# Patient Record
Sex: Female | Born: 1935 | Race: White | Hispanic: No | Marital: Single | Smoking: Former smoker
Health system: Southern US, Community
[De-identification: ages and names within clinical notes are randomized; demographics above are authoritative.]

## PROBLEM LIST (undated history)

## (undated) DIAGNOSIS — F329 Major depressive disorder, single episode, unspecified: Secondary | ICD-10-CM

## (undated) DIAGNOSIS — K279 Peptic ulcer, site unspecified, unspecified as acute or chronic, without hemorrhage or perforation: Secondary | ICD-10-CM

## (undated) DIAGNOSIS — I639 Cerebral infarction, unspecified: Secondary | ICD-10-CM

## (undated) DIAGNOSIS — F419 Anxiety disorder, unspecified: Secondary | ICD-10-CM

## (undated) DIAGNOSIS — S32020A Wedge compression fracture of second lumbar vertebra, initial encounter for closed fracture: Secondary | ICD-10-CM

## (undated) DIAGNOSIS — F32A Depression, unspecified: Secondary | ICD-10-CM

## (undated) DIAGNOSIS — E785 Hyperlipidemia, unspecified: Secondary | ICD-10-CM

## (undated) DIAGNOSIS — F0390 Unspecified dementia without behavioral disturbance: Secondary | ICD-10-CM

## (undated) DIAGNOSIS — D472 Monoclonal gammopathy: Secondary | ICD-10-CM

## (undated) HISTORY — DX: Depression, unspecified: F32.A

## (undated) HISTORY — PX: TONSILLECTOMY: SUR1361

## (undated) HISTORY — DX: Hyperlipidemia, unspecified: E78.5

## (undated) HISTORY — DX: Anxiety disorder, unspecified: F41.9

## (undated) HISTORY — DX: Monoclonal gammopathy: D47.2

## (undated) HISTORY — DX: Major depressive disorder, single episode, unspecified: F32.9

## (undated) HISTORY — PX: OTHER SURGICAL HISTORY: SHX169

## (undated) HISTORY — DX: Peptic ulcer, site unspecified, unspecified as acute or chronic, without hemorrhage or perforation: K27.9

## (undated) HISTORY — PX: DILATION AND CURETTAGE OF UTERUS: SHX78

---

## 1997-11-05 ENCOUNTER — Encounter: Payer: Self-pay | Admitting: Orthopedic Surgery

## 1997-11-05 ENCOUNTER — Ambulatory Visit (HOSPITAL_COMMUNITY): Admission: RE | Admit: 1997-11-05 | Discharge: 1997-11-05 | Payer: Self-pay | Admitting: Orthopedic Surgery

## 1998-04-29 ENCOUNTER — Other Ambulatory Visit: Admission: RE | Admit: 1998-04-29 | Discharge: 1998-04-29 | Payer: Self-pay | Admitting: *Deleted

## 1999-08-09 ENCOUNTER — Other Ambulatory Visit: Admission: RE | Admit: 1999-08-09 | Discharge: 1999-08-09 | Payer: Self-pay | Admitting: *Deleted

## 2001-09-05 ENCOUNTER — Encounter: Payer: Self-pay | Admitting: Internal Medicine

## 2001-09-05 ENCOUNTER — Encounter: Admission: RE | Admit: 2001-09-05 | Discharge: 2001-09-05 | Payer: Self-pay | Admitting: Internal Medicine

## 2003-11-05 ENCOUNTER — Encounter: Admission: RE | Admit: 2003-11-05 | Discharge: 2003-11-05 | Payer: Self-pay | Admitting: Neurology

## 2004-04-20 ENCOUNTER — Other Ambulatory Visit: Admission: RE | Admit: 2004-04-20 | Discharge: 2004-04-20 | Payer: Self-pay | Admitting: Internal Medicine

## 2008-10-19 ENCOUNTER — Emergency Department (HOSPITAL_COMMUNITY): Admission: EM | Admit: 2008-10-19 | Discharge: 2008-10-19 | Payer: Self-pay | Admitting: Emergency Medicine

## 2009-04-10 ENCOUNTER — Ambulatory Visit (HOSPITAL_COMMUNITY): Admission: RE | Admit: 2009-04-10 | Discharge: 2009-04-10 | Payer: Self-pay | Admitting: Internal Medicine

## 2010-04-23 ENCOUNTER — Encounter: Payer: Self-pay | Admitting: Oncology

## 2010-04-30 LAB — URINALYSIS, ROUTINE W REFLEX MICROSCOPIC
Bilirubin Urine: NEGATIVE
Glucose, UA: NEGATIVE mg/dL
Ketones, ur: NEGATIVE mg/dL
Nitrite: NEGATIVE
pH: 7.5 (ref 5.0–8.0)

## 2010-04-30 LAB — URINE MICROSCOPIC-ADD ON

## 2010-05-12 ENCOUNTER — Other Ambulatory Visit: Payer: Self-pay | Admitting: Oncology

## 2010-05-12 ENCOUNTER — Encounter (HOSPITAL_BASED_OUTPATIENT_CLINIC_OR_DEPARTMENT_OTHER): Payer: Medicare Other | Admitting: Oncology

## 2010-05-12 ENCOUNTER — Ambulatory Visit (HOSPITAL_COMMUNITY)
Admission: RE | Admit: 2010-05-12 | Discharge: 2010-05-12 | Disposition: A | Discharge status: Home / Self Care | Payer: Medicare Other | Source: Ambulatory Visit | Attending: Oncology | Admitting: Oncology

## 2010-05-12 DIAGNOSIS — D472 Monoclonal gammopathy: Secondary | ICD-10-CM

## 2010-05-12 DIAGNOSIS — C9000 Multiple myeloma not having achieved remission: Secondary | ICD-10-CM

## 2010-05-12 DIAGNOSIS — M810 Age-related osteoporosis without current pathological fracture: Secondary | ICD-10-CM

## 2010-05-12 DIAGNOSIS — M47817 Spondylosis without myelopathy or radiculopathy, lumbosacral region: Secondary | ICD-10-CM | POA: Insufficient documentation

## 2010-05-12 DIAGNOSIS — M545 Low back pain, unspecified: Secondary | ICD-10-CM | POA: Insufficient documentation

## 2010-05-12 DIAGNOSIS — M4120 Other idiopathic scoliosis, site unspecified: Secondary | ICD-10-CM | POA: Insufficient documentation

## 2010-05-12 DIAGNOSIS — Z87898 Personal history of other specified conditions: Secondary | ICD-10-CM | POA: Insufficient documentation

## 2010-05-12 LAB — CBC WITH DIFFERENTIAL/PLATELET
Basophils Absolute: 0 10*3/uL (ref 0.0–0.1)
Eosinophils Absolute: 0 10*3/uL (ref 0.0–0.5)
HGB: 13.1 g/dL (ref 11.6–15.9)
NEUT#: 3.9 10*3/uL (ref 1.5–6.5)
RDW: 13.5 % (ref 11.2–14.5)
lymph#: 1.5 10*3/uL (ref 0.9–3.3)

## 2010-05-13 ENCOUNTER — Other Ambulatory Visit: Payer: Self-pay | Admitting: Oncology

## 2010-05-14 LAB — COMPREHENSIVE METABOLIC PANEL
AST: 19 U/L (ref 0–37)
Albumin: 4.3 g/dL (ref 3.5–5.2)
BUN: 7 mg/dL (ref 6–23)
Calcium: 9.4 mg/dL (ref 8.4–10.5)
Chloride: 98 mEq/L (ref 96–112)
Glucose, Bld: 104 mg/dL — ABNORMAL HIGH (ref 70–99)
Potassium: 3.9 mEq/L (ref 3.5–5.3)

## 2010-05-14 LAB — SPEP & IFE WITH QIG
Albumin ELP: 52.9 % — ABNORMAL LOW (ref 55.8–66.1)
Alpha-1-Globulin: 3.8 % (ref 2.9–4.9)
Beta 2: 2.9 % — ABNORMAL LOW (ref 3.2–6.5)
IgM, Serum: 64 mg/dL (ref 60–263)
Total Protein, Serum Electrophoresis: 7.8 g/dL (ref 6.0–8.3)

## 2010-05-14 LAB — KAPPA/LAMBDA LIGHT CHAINS
Kappa:Lambda Ratio: 0.5 (ref 0.26–1.65)
Lambda Free Lght Chn: 1.23 mg/dL (ref 0.57–2.63)

## 2010-05-17 LAB — UIFE/LIGHT CHAINS/TP QN, 24-HR UR
Alpha 2, Urine: DETECTED — AB
Time: 24 hours
Total Protein, Urine-Ur/day: 14 mg/d (ref 10–140)
Total Protein, Urine: 1.4 mg/dL

## 2010-05-26 ENCOUNTER — Encounter (HOSPITAL_BASED_OUTPATIENT_CLINIC_OR_DEPARTMENT_OTHER): Payer: Medicare Other | Admitting: Oncology

## 2010-05-26 DIAGNOSIS — D472 Monoclonal gammopathy: Secondary | ICD-10-CM

## 2010-10-08 ENCOUNTER — Emergency Department (HOSPITAL_COMMUNITY): Payer: Medicare Other

## 2010-10-08 ENCOUNTER — Emergency Department (HOSPITAL_COMMUNITY)
Admission: EM | Admit: 2010-10-08 | Discharge: 2010-10-08 | Disposition: A | Discharge status: Home / Self Care | Payer: Medicare Other | Attending: Emergency Medicine | Admitting: Emergency Medicine

## 2010-10-08 DIAGNOSIS — S32009A Unspecified fracture of unspecified lumbar vertebra, initial encounter for closed fracture: Secondary | ICD-10-CM | POA: Insufficient documentation

## 2010-10-08 DIAGNOSIS — M549 Dorsalgia, unspecified: Secondary | ICD-10-CM | POA: Insufficient documentation

## 2010-10-08 DIAGNOSIS — W010XXA Fall on same level from slipping, tripping and stumbling without subsequent striking against object, initial encounter: Secondary | ICD-10-CM | POA: Insufficient documentation

## 2010-10-08 DIAGNOSIS — Y92009 Unspecified place in unspecified non-institutional (private) residence as the place of occurrence of the external cause: Secondary | ICD-10-CM | POA: Insufficient documentation

## 2010-10-08 DIAGNOSIS — R51 Headache: Secondary | ICD-10-CM | POA: Insufficient documentation

## 2010-10-08 LAB — DIFFERENTIAL
Lymphs Abs: 2.3 10*3/uL (ref 0.7–4.0)
Monocytes Relative: 8 % (ref 3–12)
Neutro Abs: 5.1 10*3/uL (ref 1.7–7.7)
Neutrophils Relative %: 63 % (ref 43–77)

## 2010-10-08 LAB — BASIC METABOLIC PANEL
BUN: 9 mg/dL (ref 6–23)
CO2: 24 mEq/L (ref 19–32)
Calcium: 10.4 mg/dL (ref 8.4–10.5)
Creatinine, Ser: 0.51 mg/dL (ref 0.50–1.10)
Glucose, Bld: 110 mg/dL — ABNORMAL HIGH (ref 70–99)

## 2010-10-08 LAB — CBC
HCT: 37.8 % (ref 36.0–46.0)
Hemoglobin: 13.7 g/dL (ref 12.0–15.0)
MCH: 33.7 pg (ref 26.0–34.0)
MCV: 92.9 fL (ref 78.0–100.0)
RBC: 4.07 MIL/uL (ref 3.87–5.11)

## 2010-10-09 ENCOUNTER — Inpatient Hospital Stay (HOSPITAL_COMMUNITY)
Admission: EM | Admit: 2010-10-09 | Discharge: 2010-10-12 | DRG: 552 | Disposition: A | Discharge status: Home Health | Payer: Medicare Other | Attending: Internal Medicine | Admitting: Internal Medicine

## 2010-10-09 DIAGNOSIS — Y93H2 Activity, gardening and landscaping: Secondary | ICD-10-CM

## 2010-10-09 DIAGNOSIS — M545 Low back pain, unspecified: Secondary | ICD-10-CM | POA: Diagnosis present

## 2010-10-09 DIAGNOSIS — S32009A Unspecified fracture of unspecified lumbar vertebra, initial encounter for closed fracture: Principal | ICD-10-CM | POA: Diagnosis present

## 2010-10-09 DIAGNOSIS — F17200 Nicotine dependence, unspecified, uncomplicated: Secondary | ICD-10-CM | POA: Diagnosis present

## 2010-10-09 DIAGNOSIS — R296 Repeated falls: Secondary | ICD-10-CM | POA: Diagnosis present

## 2010-10-09 DIAGNOSIS — M810 Age-related osteoporosis without current pathological fracture: Secondary | ICD-10-CM | POA: Diagnosis present

## 2010-10-09 LAB — BASIC METABOLIC PANEL
BUN: 7 mg/dL (ref 6–23)
Calcium: 9.9 mg/dL (ref 8.4–10.5)
GFR calc non Af Amer: >60 mL/min (ref >60)
Glucose, Bld: 100 mg/dL — ABNORMAL HIGH (ref 70–99)
Sodium: 134 mEq/L — ABNORMAL LOW (ref 135–145)

## 2010-10-09 LAB — CBC
Hemoglobin: 12.3 g/dL (ref 12.0–15.0)
MCH: 33.5 pg (ref 26.0–34.0)
MCHC: 36.1 g/dL — ABNORMAL HIGH (ref 30.0–36.0)

## 2010-10-09 LAB — URINALYSIS, ROUTINE W REFLEX MICROSCOPIC
Bilirubin Urine: NEGATIVE
Protein, ur: NEGATIVE mg/dL
Urobilinogen, UA: 0.2 mg/dL (ref 0.0–1.0)

## 2010-10-09 LAB — URINE MICROSCOPIC-ADD ON

## 2010-10-09 LAB — DIFFERENTIAL
Basophils Relative: 0 % (ref 0–1)
Monocytes Absolute: 0.8 10*3/uL (ref 0.1–1.0)
Monocytes Relative: 9 % (ref 3–12)
Neutro Abs: 7.4 10*3/uL (ref 1.7–7.7)

## 2010-10-10 LAB — BASIC METABOLIC PANEL
CO2: 24 mEq/L (ref 19–32)
Calcium: 8.9 mg/dL (ref 8.4–10.5)
Chloride: 103 mEq/L (ref 96–112)
Creatinine, Ser: 0.52 mg/dL (ref 0.50–1.10)
GFR calc Af Amer: >60 mL/min (ref >60)
Sodium: 133 mEq/L — ABNORMAL LOW (ref 135–145)

## 2010-10-11 LAB — BASIC METABOLIC PANEL
Calcium: 9.2 mg/dL (ref 8.4–10.5)
GFR calc non Af Amer: >60 mL/min (ref >60)
Potassium: 3.2 mEq/L — ABNORMAL LOW (ref 3.5–5.1)
Sodium: 133 mEq/L — ABNORMAL LOW (ref 135–145)

## 2010-10-12 NOTE — Discharge Summary (Unsigned)
[name], Dana Bush NO.:  [id number]  MEDICAL RECORD NO.:  [id number]  LOCATION:  1602                         FACILITY:  [facility]  PHYSICIAN:  Zannie Cove, MD     DATE OF BIRTH:  11-12-1926  DATE OF ADMISSION:  10/09/2010 DATE OF DISCHARGE:                              DISCHARGE SUMMARY   PRIMARY CARE PHYSICIAN:  Soyla Murphy. Renne Crigler, MD  DISCHARGE DIAGNOSES: 1. Low back pain from L2 compression fracture. 2. Nausea and vomiting secondary to narcotics, resolved. 3. Tobacco use. 4. History of osteoporosis. 5. History of osteoarthritis.  DISCHARGE MEDICATIONS: 1. Tylenol 650 mg q.4 h. p.r.n. 2. Naproxen 500 mg p.o. b.i.d. for 5 days. 3. Cyclobenzaprine 5 mg p.o. t.i.d. probably for another 10 days or     so. 4. MiraLax 17 grams p.o. daily as needed for constipation. 5. Hydrocodone/APAP 5/325 one to two tablets q.6 h. p.r.n. for pain,     dispensed total of 20 pills. 6. Calcium carbonate with vitamin D 1 tablet p.o. daily.  DIAGNOSTICS/INVESTIGATIONS: 1. CT of the head, October 08, 2010 shows atrophy and small-vessel     ischemic changes, no acute intracranial abnormality. 2. X-ray of the lumbar spine shows L2 compression fracture.  HOSPITAL COURSE:  [name] is an 77 year old white female who had presented to the emergency room after a fall on her back where she was noted to have a L2 compression fracture and subsequently discharged home on pain medicines.  However, when she went back home, she was having severe low back pain which was impairing her mobility as well as having nausea and vomiting and hence brought back to the hospital by her son. 1. Nausea and vomiting felt to be related to narcotics, which she had     been recently prescribed.  She was managed symptomatically with     clear liquids in her diet and was subsequently advanced and her     nausea, vomiting at this time has completely resolved. 2. L2 compression fracture.  She is admitted  for pain control with     regards to this, initially started on Dilantin, subsequently     transitioned to naproxen and Flexeril and Vicodin added for better     pain control.  Today, she is ambulating with a walker now in the     room as well as in the hallway and is being discharged home with     Home Health Physical Therapy.  Kyphoplasty was offered to the     patient, however, she declined this and given that the long-term     data regarding kyphoplasty is questionable, it was not pursued     further.  DISCHARGE CONDITION:  Stable.  DISCHARGE VITAL SIGNS:  Temperature 98.1, pulse 53, blood pressure 138/58, respirations 16, and saturating 94% room air.  DISCHARGE FOLLOWUP:  With Dr. Renne Crigler in 1 week.     Zannie Cove, MD     PJ/MEDQ  D:  10/12/2010  T:  10/12/2010  Job:  [id number]  cc:   Soyla Murphy. Renne Crigler, M.D. Fax: [phone]

## 2010-10-14 NOTE — H&P (Signed)
Dana Bush, RUDDELL NO.:  [id number]  MEDICAL RECORD NO.:  [id number]  LOCATION:  1602                         FACILITY:  [facility]  PHYSICIAN:  Suella Grove, MD          DATE OF BIRTH:  [birth year]  DATE OF ADMISSION:  10/09/2010 DATE OF DISCHARGE:                             HISTORY & PHYSICAL   PCP:  Dr. Norma Fredrickson.  CHIEF COMPLAINT:  Uncontrolled pain from L2 fracture.  HISTORY OF PRESENT ILLNESS:  The patient is an 77 year old woman who has a history of chronic back pain and osteoporosis, who initially had a traumatic event yesterday to an L2 compression fracture.  The patient reports that she was in her usual state of health yesterday when she fell backwards after trying to pull up a weed from her garden.  Fall was a witnessed fall, seen by her husband.  The patient reports having fallen on her back and hitting her head.  No loss of consciousness. This patient was brought to the emergency room at [facility] yesterday by EMS, at which point labs and imaging were performed.  Head CT was negative for any acute process.  Lumbar spine radiographs demonstrated an L2 compression deformity, new since the previous study.  It was thought that this new L2 deformity was secondary to her recent fall. The patient was discharged to home with pain medications given her desire to return home.  However, the patient reports that she was not able to do well at home, with p.o. medications not helping her pain.  Her son who was with the patient at time of interview reports that the patient had not taken any p.o. intake since leaving the Emergency Department.  The patient reports continued persistent back pain with no radiating pain down the legs.  No paresthesias.  No bowel or bladder incontinence. Able to walk, although limited due to pain.  Has been having some issues with nausea and emesis with the pain medication.  The patient thought that she needed further help with pain control  at which point her son brought her back to the ED for further evaluation.  PAST MEDICAL HISTORY:  Arthritis, back pain, osteoporosis.  PAST SURGICAL HISTORY:  None.  ALLERGIES:  No known drug allergies.  MEDICATIONS:  None.  SOCIAL HISTORY:  Married, does not work.  Reports a 23-pack year history of smoking.  No EtOH or illicits.  FAMILY HISTORY:  No history of cardiovascular disease or malignancy.  REVIEW OF SYSTEMS:  GENERAL:  No fever, chills, sweats, night sweats, or weight loss.  HEENT:  No problems with vision, no dysphasia or odynophagia. PULMONARY:  No cough or history of obstructive lung disease. CARDIOVASCULAR:  No problems with chest pain, palpitations, dyspnea on exertion, or paroxysmal nocturnal dyspnea.  GI:  No abdominal pain, melena, hematochezia, or diarrhea. GU:  No dysuria, increased urinary urgency or frequency. MUSCULOSKELETAL:  As per HPI.  SKIN:  No rash. NEURO:  As per HPI.  PSYCHIATRIC:  No suicidal or homicidal ideation.  PHYSICAL EXAMINATION:  VITALS:  Temperature 98.6, blood pressure 132/50,heart rate 73, respirations 16, O2 sat 94% on room air. GENERAL:  Alert and oriented x3, in no  apparent distress. HEENT:  Head atraumatic.  Pupils are equal and reactive to light. Extraocular motions are intact.  Sclerae are anicteric.  Oropharynx is unremarkable. NECK:  Soft, no thyromegaly or lymphadenopathy identified. LUNGS:  Clear to auscultation bilaterally.  No wheezes, rales, or rhonchi. HEART:  Regular rate and rhythm, normal S1 and S2, no rubs, gallops, or murmurs. ABDOMEN:  Soft, nontender, nondistended, positive bowel sounds, no masses. BACK:  Midline tenderness to palpation in the region of the L2 area.  No radiating pain. NEURO:  Grossly intact with 5/5 strength in the bilateral lower extremities, limited mildly on the left secondary to pain.  2+ patellar and ankle jerk reflexes are noted.  LABORATORY DATA:  CBC:  White blood count 9.4,  hemoglobin 12.3, platelet count 204.  Chemistry:  Sodium 134, potassium 3.6, chloride 100, bicarb 26, BUN 7, creatinine 0.55, glucose 100.  Urinalysis, grossly negative.  IMAGING:  (From yesterday):  Lumbar spine radiographs positive for L2 compression deformity.  IMPRESSION/PLAN: 1. L2 compression fracture:  Admit the patient for pain control.  We     will discontinue p.o. pain control regimen and start with low dose     Dilaudid regimen to calculate the patient's 24-hour needs prior to     adjustment.  We will add Colace and Zofran p.r.n. for associated     symptoms.  We will consider an Interventional Radiology and/or     Neurosurgical consult for further surgical treatment if the patient     desires.  The patient will consider an bisphosphonate therapy,     which she has discussed with her PCP.  Prior to admission, the     patient reports that she had decided not to take bisphosphonate.     However, given the evidence for secondary fracture prevention, the     patient has started to reconsider the option of Zometa.  To be     discussed with her son and her physician in near future. 2. Tobacco abuse:  We will start a nicotine patch given the patient's     longstanding dependence on tobacco.  Smoking cessation consult will     also be consulted. 3. Fluids/electrolytes/nutrition:  We will hep-lock the IV.     Electrolytes will be monitored.  The patient will be restarted on a     regular diet as tolerated.  DISPOSITION:  Physical therapy consult.  The patient reports that she would like to be full code with the caveat that no "heroic measures are performed."  The patient's power of attorney and son is Dana Bush, and his home phone number is [phone], cell phone number [phone].          ______________________________ Suella Grove, MD     AC/MEDQ  D:  10/09/2010  T:  10/09/2010  Job:  [id number]  Electronically Signed by Suella Grove MD on 10/14/2010 12:08:50 PM

## 2010-10-18 ENCOUNTER — Emergency Department (HOSPITAL_COMMUNITY)
Admission: EM | Admit: 2010-10-18 | Discharge: 2010-10-19 | Disposition: A | Discharge status: Home / Self Care | Payer: Medicare Other | Attending: Emergency Medicine | Admitting: Emergency Medicine

## 2010-10-18 ENCOUNTER — Emergency Department (HOSPITAL_COMMUNITY): Payer: Medicare Other

## 2010-10-18 DIAGNOSIS — M439 Deforming dorsopathy, unspecified: Secondary | ICD-10-CM | POA: Insufficient documentation

## 2010-10-18 DIAGNOSIS — R109 Unspecified abdominal pain: Secondary | ICD-10-CM | POA: Insufficient documentation

## 2010-10-18 DIAGNOSIS — Z79899 Other long term (current) drug therapy: Secondary | ICD-10-CM | POA: Insufficient documentation

## 2010-10-18 DIAGNOSIS — K5900 Constipation, unspecified: Secondary | ICD-10-CM | POA: Insufficient documentation

## 2010-11-02 ENCOUNTER — Encounter: Payer: Self-pay | Admitting: *Deleted

## 2010-11-17 NOTE — Discharge Summary (Addendum)
  NAMEJANNESSA, OGDEN NO.:  [id number]  MEDICAL RECORD NO.:  [id number]  LOCATION:  WLED                         FACILITY:  [facility]  PHYSICIAN:  Zannie Cove, MD     DATE OF BIRTH:  11-Sep-1926  DATE OF ADMISSION:  10/18/2010 DATE OF DISCHARGE:  10/19/2010                              DISCHARGE SUMMARY   PRIMARY CARE PHYSICIAN:  Soyla Murphy. Pharr, MD  DISCHARGE DIAGNOSES: 1. L2 compression fracture status post fall. 2. Tobacco use. 3. Constipation secondary to narcotics, improved. 4. Osteoporosis. 5. History of osteoarthritis. 6. History of low back pain.  DISCHARGE MEDICATIONS: 1. Tylenol 650 mg q.4 h. p.r.n. 2. Flexeril 5 mg p.o. t.i.d. for 10 days. 3. Vicodin 5/325 one to two tabs q.6 h. p.r.n. for pain. 4. Naproxen 500 mg p.o. b.i.d. for 5 days. 5. MiraLAX 17 g daily p.r.n. 6. Calcium carbonate/vitamin D 1 tab daily. 7. Multivitamin 1 tab daily.  HOSPITAL COURSE:  Ms. Husak is an 77 year old female with prior history of osteoporosis and chronic back pain, had a fall 2 days prior to admission.  Subsequently came to [facility] ER, had a head CT performed which was unremarkable and lumbar x-rays which showed an L2 compression deformity, which was felt to be new.  She was discharged home with pain medications given her desire to return home.  However, after the patient went back home, she was not able to do well in terms of pain control, as well as ambulation, and hence came back to the ER. 1. L2 compression fracture.  She was treated with anti-inflammatories,     i.e. naproxen as well as Flexeril for spasms and Vicodin p.r.n. for     pain, as well as followed by Physical Therapy through her hospital     course who started ambulating her with a walker. 2. Constipation secondary to narcotics, resolved. Condition at time of discharge is stable.  She is discharged home on NSAIDs for 5 days, Flexeril for about 10 days, and Vicodin p.r.n. for 1- 2 weeks'  time as well, as well as home health physical therapy.     Zannie Cove, MD     PJ/MEDQ  D:  11/11/2010  T:  11/11/2010  Job:  [id number]  cc:   Soyla Murphy. Renne Crigler, M.D.  Electronically Signed by Zannie Cove  on 11/17/2010 03:02:01 PM

## 2010-12-11 ENCOUNTER — Telehealth: Payer: Self-pay | Admitting: Oncology

## 2010-12-11 NOTE — Telephone Encounter (Signed)
S/w pt, gave new appt 03/08/11 @ 10.30am. Date per pt req. Pt says she fell and broke her bac and needs time to recover. Advised pt I will also mail her an appt calendar.

## 2011-01-05 ENCOUNTER — Emergency Department (HOSPITAL_COMMUNITY): Payer: Medicare Other

## 2011-01-05 ENCOUNTER — Inpatient Hospital Stay (HOSPITAL_COMMUNITY)
Admission: EM | Admit: 2011-01-05 | Discharge: 2011-01-11 | DRG: 552 | Disposition: A | Discharge status: Skilled Nursing Facility | Payer: Medicare Other | Attending: Internal Medicine | Admitting: Internal Medicine

## 2011-01-05 ENCOUNTER — Encounter (HOSPITAL_COMMUNITY): Payer: Self-pay | Admitting: Emergency Medicine

## 2011-01-05 DIAGNOSIS — S32010A Wedge compression fracture of first lumbar vertebra, initial encounter for closed fracture: Secondary | ICD-10-CM | POA: Diagnosis present

## 2011-01-05 DIAGNOSIS — M810 Age-related osteoporosis without current pathological fracture: Secondary | ICD-10-CM | POA: Diagnosis present

## 2011-01-05 DIAGNOSIS — R42 Dizziness and giddiness: Secondary | ICD-10-CM | POA: Diagnosis not present

## 2011-01-05 DIAGNOSIS — Y92009 Unspecified place in unspecified non-institutional (private) residence as the place of occurrence of the external cause: Secondary | ICD-10-CM

## 2011-01-05 DIAGNOSIS — E441 Mild protein-calorie malnutrition: Secondary | ICD-10-CM

## 2011-01-05 DIAGNOSIS — N390 Urinary tract infection, site not specified: Secondary | ICD-10-CM

## 2011-01-05 DIAGNOSIS — S32009A Unspecified fracture of unspecified lumbar vertebra, initial encounter for closed fracture: Principal | ICD-10-CM | POA: Diagnosis present

## 2011-01-05 DIAGNOSIS — Z8711 Personal history of peptic ulcer disease: Secondary | ICD-10-CM

## 2011-01-05 DIAGNOSIS — F17200 Nicotine dependence, unspecified, uncomplicated: Secondary | ICD-10-CM | POA: Diagnosis present

## 2011-01-05 DIAGNOSIS — W010XXA Fall on same level from slipping, tripping and stumbling without subsequent striking against object, initial encounter: Secondary | ICD-10-CM | POA: Diagnosis present

## 2011-01-05 DIAGNOSIS — W19XXXA Unspecified fall, initial encounter: Secondary | ICD-10-CM | POA: Diagnosis present

## 2011-01-05 DIAGNOSIS — E871 Hypo-osmolality and hyponatremia: Secondary | ICD-10-CM | POA: Diagnosis present

## 2011-01-05 HISTORY — DX: Wedge compression fracture of second lumbar vertebra, initial encounter for closed fracture: S32.020A

## 2011-01-05 LAB — BASIC METABOLIC PANEL
BUN: 9 mg/dL (ref 6–23)
CO2: 23 mEq/L (ref 19–32)
Chloride: 92 mEq/L — ABNORMAL LOW (ref 96–112)
GFR calc non Af Amer: 87 mL/min — ABNORMAL LOW (ref >90)
Glucose, Bld: 110 mg/dL — ABNORMAL HIGH (ref 70–99)
Potassium: 4 mEq/L (ref 3.5–5.1)
Sodium: 123 mEq/L — ABNORMAL LOW (ref 135–145)

## 2011-01-05 LAB — CBC
Hemoglobin: 12.1 g/dL (ref 12.0–15.0)
Platelets: 210 10*3/uL (ref 150–400)
RBC: 3.72 MIL/uL — ABNORMAL LOW (ref 3.87–5.11)
WBC: 12.9 10*3/uL — ABNORMAL HIGH (ref 4.0–10.5)

## 2011-01-05 LAB — DIFFERENTIAL
Eosinophils Absolute: 0 10*3/uL (ref 0.0–0.7)
Lymphocytes Relative: 10 % — ABNORMAL LOW (ref 12–46)
Lymphs Abs: 1.3 10*3/uL (ref 0.7–4.0)
Monocytes Relative: 6 % (ref 3–12)
Neutro Abs: 10.8 10*3/uL — ABNORMAL HIGH (ref 1.7–7.7)
Neutrophils Relative %: 84 % — ABNORMAL HIGH (ref 43–77)

## 2011-01-05 LAB — URINALYSIS, ROUTINE W REFLEX MICROSCOPIC
Specific Gravity, Urine: 1.012 (ref 1.005–1.030)
Urobilinogen, UA: 0.2 mg/dL (ref 0.0–1.0)
pH: 7 (ref 5.0–8.0)

## 2011-01-05 LAB — URINE MICROSCOPIC-ADD ON

## 2011-01-05 MED ORDER — ONDANSETRON HCL 4 MG/2ML IJ SOLN
INTRAMUSCULAR | Status: AC
  Administered 2011-01-05: 17:00:00 via INTRAVENOUS
  Filled 2011-01-05: qty 2

## 2011-01-05 MED ORDER — POTASSIUM CHLORIDE IN NACL 20-0.9 MEQ/L-% IV SOLN
INTRAVENOUS | Status: DC
  Administered 2011-01-06 – 2011-01-07 (×3): via INTRAVENOUS
  Filled 2011-01-05 (×12): qty 1000

## 2011-01-05 MED ORDER — OXYCODONE HCL 5 MG PO TABS
5.0000 mg | ORAL_TABLET | Freq: Three times a day (TID) | ORAL | Status: DC
  Administered 2011-01-06 – 2011-01-11 (×15): 5 mg via ORAL
  Filled 2011-01-05 (×15): qty 1

## 2011-01-05 MED ORDER — HYDROCODONE-ACETAMINOPHEN 5-325 MG PO TABS
1.0000 | ORAL_TABLET | Freq: Once | ORAL | Status: AC
  Administered 2011-01-05: 1 via ORAL
  Filled 2011-01-05: qty 1

## 2011-01-05 MED ORDER — DEXTROSE 5 % IV SOLN
1.0000 g | Freq: Once | INTRAVENOUS | Status: AC
  Administered 2011-01-05: 1 g via INTRAVENOUS
  Filled 2011-01-05: qty 10

## 2011-01-05 MED ORDER — SODIUM CHLORIDE 0.9 % IV SOLN
INTRAVENOUS | Status: DC
  Administered 2011-01-05: 1000 mL via INTRAVENOUS

## 2011-01-05 MED ORDER — ONDANSETRON HCL 4 MG/2ML IJ SOLN
INTRAMUSCULAR | Status: AC
  Filled 2011-01-05: qty 2

## 2011-01-05 MED ORDER — ONDANSETRON HCL 4 MG/2ML IJ SOLN
INTRAMUSCULAR | Status: AC
  Administered 2011-01-05: 4 mg via INTRAVENOUS
  Filled 2011-01-05: qty 2

## 2011-01-05 NOTE — ED Notes (Signed)
Pt. Dana Bush stumbled and fell at home.  No LOC c/o lower back pain.

## 2011-01-05 NOTE — ED Notes (Signed)
Pt. With prior fall 3 months earlier in which she sustained a "shattered spine" according to her spouse, fell again today.  Pt."s spouse reports that she did get dizzy prior to fall.  No LOC. Pt. Vomited in route to hospital and was given 4mg . Of Zofran IV.  She arrived alert, and oriented c/o lower back pain.  No prior medical history except for previous fall.

## 2011-01-05 NOTE — ED Provider Notes (Signed)
History     CSN: [id number] Arrival date & time: 01/05/2011  4:55 PM   First MD Initiated Contact with Patient 01/05/11 1708      Chief Complaint  Patient presents with  . Fall    (Consider location/radiation/quality/duration/timing/severity/associated sxs/prior treatment) Patient is a 77 y.o. female presenting with fall.  Fall The accident occurred 1 to 2 hours ago. The fall occurred while walking. She landed on carpet. Point of impact: Lower back. The pain is present in the head (Neck and low back). The pain is moderate. She was not ambulatory at the scene. There was no entrapment after the fall. There was no drug use involved in the accident. There was no alcohol use involved in the accident. Pertinent negatives include no visual change, no abdominal pain, no bowel incontinence, no vomiting, no hearing loss and no loss of consciousness. The symptoms are aggravated by activity, standing and ambulation. Treatment on scene includes a c-collar and a backboard. She has tried nothing for the symptoms.   patient tripped while walking without her ambulation device. She had been attempting to put up some decorations. She did not ambulate afterwards because of severe low back pain. She denies bowel or urinary incontinence and has no weakness or numbness of the lower legs.  Past Medical History  Diagnosis Date  . Peptic ulcer disease   . Osteoporosis   . Depression   . Anxiety   . Hyperlipemia   . IgG monoclonal gammopathy     Past Surgical History  Procedure Date  . Cervical polyp removal   . Dilation and curettage of uterus   . Tonsillectomy     No family history on file.  History  Substance Use Topics  . Smoking status: Current Everyday Smoker -- 1.0 packs/day  . Smokeless tobacco: Not on file  . Alcohol Use: No    OB History    Grav Para Term Preterm Abortions TAB SAB Ect Mult Living                  Review of Systems  Gastrointestinal: Negative for vomiting,  abdominal pain and bowel incontinence.  Neurological: Negative for loss of consciousness.  All other systems reviewed and are negative.    Allergies  Review of patient's allergies indicates no known allergies.  Home Medications   Current Outpatient Rx  Name Route Sig Dispense Refill  . ASPIRIN EC 81 MG PO TBEC Oral Take 81 mg by mouth daily.      Marland Kitchen VITAMIN D 1000 UNITS PO TABS Oral Take 1,000 Units by mouth daily.      Marland Kitchen HYDROCODONE-ACETAMINOPHEN 5-325 MG PO TABS Oral Take 1-2 tablets by mouth every 6 (six) hours as needed. For pain.     Marland Kitchen MIRTAZAPINE 15 MG PO TABS Oral Take 15 mg by mouth at bedtime.      Carma Leaven M PLUS PO TABS Oral Take 1 tablet by mouth daily.      Marland Kitchen NAPROXEN 500 MG PO TABS Oral Take 500 mg by mouth 2 (two) times daily as needed. For pain.     Marland Kitchen TRAMADOL HCL 50 MG PO TABS Oral Take 50 mg by mouth every 8 (eight) hours as needed. Maximum dose= 8 tablets per day; for pain.       BP 145/69  Pulse 76  Temp(Src) 97.7 F (36.5 C) (Oral)  Resp 18  SpO2 94%  Physical Exam  Constitutional: She is oriented to person, place, and time. She appears well-developed and  well-nourished.  HENT:  Head: Normocephalic and atraumatic.  Eyes: Conjunctivae and EOM are normal. Pupils are equal, round, and reactive to light.  Neck: Normal range of motion. Neck supple.  Cardiovascular: Normal rate and regular rhythm.   Pulmonary/Chest: Effort normal and breath sounds normal.  Abdominal: Soft.  Musculoskeletal: She exhibits no edema.       Tender lumbar and cervical spines. No deformity, redness or swelling of the neck or back. The cervical collar was replaced after the exam.  Neurological: She is alert and oriented to person, place, and time. No cranial nerve deficit. She exhibits normal muscle tone. Coordination normal.  Skin: Skin is warm and dry.  Psychiatric: She has a normal mood and affect. Her behavior is normal.    ED Course  Procedures (including critical care  time) She was removed from the backboard , afterwards, she had some nausea and vomiting. She was treated with Zofran. Labs Reviewed  CBC - Abnormal; Notable for the following:    WBC 12.9 (*)    RBC 3.72 (*)    HCT 34.1 (*)    All other components within normal limits  DIFFERENTIAL - Abnormal; Notable for the following:    Neutrophils Relative 84 (*)    Neutro Abs 10.8 (*)    Lymphocytes Relative 10 (*)    All other components within normal limits  BASIC METABOLIC PANEL - Abnormal; Notable for the following:    Sodium 123 (*)    Chloride 92 (*)    Glucose, Bld 110 (*)    GFR calc non Af Amer 87 (*)    All other components within normal limits  URINALYSIS, ROUTINE W REFLEX MICROSCOPIC - Abnormal; Notable for the following:    Hgb urine dipstick SMALL (*)    Ketones, ur 15 (*)    Leukocytes, UA TRACE (*)    All other components within normal limits  URINE MICROSCOPIC-ADD ON - Abnormal; Notable for the following:    Bacteria, UA FEW (*)    Casts HYALINE CASTS (*)    All other components within normal limits  URINE CULTURE   Dg Chest 2 View  01/05/2011  *RADIOLOGY REPORT*  Clinical Data: Fall  CHEST - 2 VIEW  Comparison: None.  Findings: Prominent lung markings suggestive of chronic lung disease.  Negative for infiltrate or effusion.  Negative for heart failure.  Heart size is mildly enlarged.  Compression fracture of L1.  See lumbar spine report.  IMPRESSION: No active cardiopulmonary disease.  Original Report Authenticated By: Camelia Phenes, M.D.   Dg Ribs Unilateral Right  01/05/2011  *RADIOLOGY REPORT*  Clinical Data: Fall.  Pain  RIGHT RIBS - 2 VIEW  Comparison: None.  Findings: Negative for rib fracture on the right.  No focal bony lesion.  IMPRESSION: Negative  Original Report Authenticated By: Camelia Phenes, M.D.   Dg Lumbar Spine Complete  01/05/2011  *RADIOLOGY REPORT*  Clinical Data: Fall.  Back pain  LUMBAR SPINE - COMPLETE 4+ VIEW  Comparison: 10/08/2010  Findings:  Moderate to severe fracture of L1 has progressed in severity since the prior study.  This may be related to the recent fall.  No other fractures.  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  Grade 1 slip L4 on L5 with mild disc degeneration at L3-4 and L4-5. Atherosclerotic disease in the aorta.  IMPRESSION: Moderate to severe compression fracture of L1, with progression since 10/08/2010.  Per CMS PQRS  reporting requirements (PQRS Measure 24): Given the patient's age of greater than 50 and the fracture site (hip, distal radius, or spine), the patient should be tested for osteoporosis using DXA, and the appropriate treatment considered based on the DXA results.  Original Report Authenticated By: Camelia Phenes, M.D.   Dg Pelvis 1-2 Views  01/05/2011  *RADIOLOGY REPORT*  Clinical Data: Fall.  Pain  PELVIS - 1-2 VIEW  Comparison: 10/18/2010  Findings: Both hips are in normal alignment.  No significant hip degeneration.  Negative for pelvic or hip fracture. Atherosclerotic disease in the iliac arteries.  IMPRESSION: Negative for fracture.  Original Report Authenticated By: Camelia Phenes, M.D.   Ct Head Wo Contrast  01/05/2011  *RADIOLOGY REPORT*  Clinical Data:  Fall.  Head and neck pain.  CT HEAD WITHOUT CONTRAST CT CERVICAL SPINE WITHOUT CONTRAST  Technique:  Multidetector CT imaging of the head and cervical spine was performed following the standard protocol without intravenous contrast.  Multiplanar CT image reconstructions of the cervical spine were also generated.  Comparison:  10/08/2010 and previous  CT HEAD  Findings: The brain shows generalized atrophy with extensive chronic small vessel disease throughout the white matter.  No sign of acute infarction, mass lesion, hemorrhage, hydrocephalus or extra-axial collection.  No skull fracture.  Sinuses, middle ears and mastoids are clear.  IMPRESSION: Atrophy and chronic small vessel disease.  No acute or  traumatic finding.  No change since previous study.  CT CERVICAL SPINE  Findings: No evidence of fracture or traumatic malalignment.  No significant degenerative change either of the discs or facets.  No focal lesion.  IMPRESSION: Remarkably normal scan for person of this age.  No acute or traumatic finding.  Only minimal degenerative changes.  Original Report Authenticated By: Thomasenia Sales, M.D.   Ct Cervical Spine Wo Contrast  01/05/2011  *RADIOLOGY REPORT*  Clinical Data:  Fall.  Head and neck pain.  CT HEAD WITHOUT CONTRAST CT CERVICAL SPINE WITHOUT CONTRAST  Technique:  Multidetector CT imaging of the head and cervical spine was performed following the standard protocol without intravenous contrast.  Multiplanar CT image reconstructions of the cervical spine were also generated.  Comparison:  10/08/2010 and previous  CT HEAD  Findings: The brain shows generalized atrophy with extensive chronic small vessel disease throughout the white matter.  No sign of acute infarction, mass lesion, hemorrhage, hydrocephalus or extra-axial collection.  No skull fracture.  Sinuses, middle ears and mastoids are clear.  IMPRESSION: Atrophy and chronic small vessel disease.  No acute or traumatic finding.  No change since previous study.  CT CERVICAL SPINE  Findings: No evidence of fracture or traumatic malalignment.  No significant degenerative change either of the discs or facets.  No focal lesion.  IMPRESSION: Remarkably normal scan for person of this age.  No acute or traumatic finding.  Only minimal degenerative changes.  Original Report Authenticated By: Thomasenia Sales, M.D.   ED course: IV fluids, and Rocephin ordered for UTI. On attempt at ambulation. She was unable to stand or walk due to the low back pain.  1. UTI (lower urinary tract infection)   2. Compression fracture of L1 lumbar vertebra       MDM  Fall, cause unclear, possibly related to UTI. She sustained a worsening compression fracture of the  lumbar spine therefore, needs to be admitted for further management with possible consideration for kyphoplasty        Flint Melter, MD 01/05/11 2335

## 2011-01-05 NOTE — H&P (Signed)
Hospital Admission Note Date: 01/05/2011  Patient name: Dana Bush Medical record number: [id number] Date of birth: [birth year] Age: 77 y.o. Gender: female PCP: Londell Moh, MD, MD  Chief Complaint: fall   History of Present Illness: Patient was putting up Christmas decorations, turned and fell.  She denies dizziness or chest pain.  She was feeling fine at the time.  Meds: Medications Prior to Admission  Medication Dose Route Frequency Provider Last Rate Last Dose  . 0.9 %  sodium chloride infusion   Intravenous Continuous Flint Melter, MD 125 mL/hr at 01/05/11 2232 1,000 mL at 01/05/11 2232  . cefTRIAXone (ROCEPHIN) 1 g in dextrose 5 % 50 mL IVPB  1 g Intravenous Once Flint Melter, MD   1 g at 01/05/11 2229  . HYDROcodone-acetaminophen (NORCO) 5-325 MG per tablet 1 tablet  1 tablet Oral Once Flint Melter, MD   1 tablet at 01/05/11 2204  . ondansetron (ZOFRAN) 4 MG/2ML injection           . ondansetron (ZOFRAN) 4 MG/2ML injection        4 mg at 01/05/11 2233  . ondansetron (ZOFRAN) 4 MG/2ML injection            No current outpatient prescriptions on file as of 01/05/2011.     Allergies: Review of patient's allergies indicates no known allergies. Past Medical History  Diagnosis Date  . Peptic ulcer disease   . Osteoporosis   . Depression   . Anxiety   . Hyperlipemia   . IgG monoclonal gammopathy    Past Surgical History  Procedure Date  . Cervical polyp removal   . Dilation and curettage of uterus   . Tonsillectomy    No family history on file. History   Social History  . Marital Status: Single    Spouse Name: N/A    Number of Children: N/A  . Years of Education: N/A   Occupational History  . Not on file.   Social History Main Topics  . Smoking status: Current Everyday Smoker -- 1.0 packs/day  . Smokeless tobacco: Not on file  . Alcohol Use: No  . Drug Use:   . Sexually Active:    Other Topics Concern  . Not on file   Social  History Narrative  . No narrative on file    Review of Systems: Review of Systems  Constitutional: Negative.   HENT: Negative.   Eyes: Negative.   Respiratory: Negative.   Cardiovascular: Negative.   Gastrointestinal: Positive for constipation. Negative for heartburn, nausea, vomiting, abdominal pain, diarrhea and melena.  Genitourinary: Negative.   Musculoskeletal: Positive for back pain.       Back pain from previous compression fracture was improving and patient was moving around easily.  Skin: Negative.   Neurological: Negative.   Psychiatric/Behavioral: Negative.      Filed Vitals:   01/05/11 1702 01/05/11 2251  BP: 145/69   Pulse: 76   Temp: 97.7 F (36.5 C)   TempSrc: Oral   Resp: 18   SpO2: 97% 94%    Physical Exam:Physical Exam  Vitals reviewed. Constitutional: She is oriented to person, place, and time. She appears well-developed and well-nourished. No distress.  HENT:  Head: Normocephalic and atraumatic.  Mouth/Throat: Oropharyngeal exudate present.  Eyes: Conjunctivae and EOM are normal. Pupils are equal, round, and reactive to light. Right eye exhibits no discharge. Left eye exhibits discharge. No scleral icterus.  Neck: Normal range of motion. Neck supple. No JVD present.  No tracheal deviation present. No thyromegaly present.  Cardiovascular: Normal rate, regular rhythm and normal heart sounds.   Pulmonary/Chest: No stridor. No respiratory distress. She has no wheezes. She has no rales. She exhibits no tenderness.  Abdominal: Soft. Bowel sounds are normal. She exhibits no distension and no mass. There is no tenderness. There is no rebound and no guarding.  Musculoskeletal: She exhibits no edema and no tenderness.  Lymphadenopathy:    She has no cervical adenopathy.  Neurological: She is alert and oriented to person, place, and time. No cranial nerve deficit.  Skin: Skin is warm. No rash noted. She is diaphoretic. No erythema. No pallor.  Psychiatric: She  has a normal mood and affect. Her behavior is normal. Judgment and thought content normal.     Lab results: Results for orders placed during the hospital encounter of 01/05/11  CBC      Component Value Range   WBC 12.9 (*) 4.0 - 10.5 (K/uL)   RBC 3.72 (*) 3.87 - 5.11 (MIL/uL)   Hemoglobin 12.1  12.0 - 15.0 (g/dL)   HCT 40.9 (*) 81.1 - 46.0 (%)   MCV 91.7  78.0 - 100.0 (fL)   MCH 32.5  26.0 - 34.0 (pg)   MCHC 35.5  30.0 - 36.0 (g/dL)   RDW 91.4  78.2 - 95.6 (%)   Platelets 210  150 - 400 (K/uL)  DIFFERENTIAL      Component Value Range   Neutrophils Relative 84 (*) 43 - 77 (%)   Neutro Abs 10.8 (*) 1.7 - 7.7 (K/uL)   Lymphocytes Relative 10 (*) 12 - 46 (%)   Lymphs Abs 1.3  0.7 - 4.0 (K/uL)   Monocytes Relative 6  3 - 12 (%)   Monocytes Absolute 0.8  0.1 - 1.0 (K/uL)   Eosinophils Relative 0  0 - 5 (%)   Eosinophils Absolute 0.0  0.0 - 0.7 (K/uL)   Basophils Relative 0  0 - 1 (%)   Basophils Absolute 0.0  0.0 - 0.1 (K/uL)  BASIC METABOLIC PANEL      Component Value Range   Sodium 123 (*) 135 - 145 (mEq/L)   Potassium 4.0  3.5 - 5.1 (mEq/L)   Chloride 92 (*) 96 - 112 (mEq/L)   CO2 23  19 - 32 (mEq/L)   Glucose, Bld 110 (*) 70 - 99 (mg/dL)   BUN 9  6 - 23 (mg/dL)   Creatinine, Ser 2.13  0.50 - 1.10 (mg/dL)   Calcium 9.2  8.4 - 08.6 (mg/dL)   GFR calc non Af Amer 87 (*) >90 (mL/min)   GFR calc Af Amer >90  >90 (mL/min)  URINALYSIS, ROUTINE W REFLEX MICROSCOPIC      Component Value Range   Color, Urine YELLOW  YELLOW    APPearance CLEAR  CLEAR    Specific Gravity, Urine 1.012  1.005 - 1.030    pH 7.0  5.0 - 8.0    Glucose, UA NEGATIVE  NEGATIVE (mg/dL)   Hgb urine dipstick SMALL (*) NEGATIVE    Bilirubin Urine NEGATIVE  NEGATIVE    Ketones, ur 15 (*) NEGATIVE (mg/dL)   Protein, ur NEGATIVE  NEGATIVE (mg/dL)   Urobilinogen, UA 0.2  0.0 - 1.0 (mg/dL)   Nitrite NEGATIVE  NEGATIVE    Leukocytes, UA TRACE (*) NEGATIVE   URINE MICROSCOPIC-ADD ON      Component Value Range     WBC, UA 0-2  <3 (WBC/hpf)   RBC / HPF 0-2  <3 (  RBC/hpf)   Bacteria, UA FEW (*) RARE    Casts HYALINE CASTS (*) NEGATIVE    Imaging results:  Dg Chest 2 View  01/05/2011  *RADIOLOGY REPORT*  Clinical Data: Fall  CHEST - 2 VIEW  Comparison: None.  Findings: Prominent lung markings suggestive of chronic lung disease.  Negative for infiltrate or effusion.  Negative for heart failure.  Heart size is mildly enlarged.  Compression fracture of L1.  See lumbar spine report.  IMPRESSION: No active cardiopulmonary disease.  Original Report Authenticated By: Camelia Phenes, M.D.   Dg Ribs Unilateral Right  01/05/2011  *RADIOLOGY REPORT*  Clinical Data: Fall.  Pain  RIGHT RIBS - 2 VIEW  Comparison: None.  Findings: Negative for rib fracture on the right.  No focal bony lesion.  IMPRESSION: Negative  Original Report Authenticated By: Camelia Phenes, M.D.   Dg Lumbar Spine Complete  01/05/2011  *RADIOLOGY REPORT*  Clinical Data: Fall.  Back pain  LUMBAR SPINE - COMPLETE 4+ VIEW  Comparison: 10/08/2010  Findings: Moderate to severe fracture of L1 has progressed in severity since the prior study.  This may be related to the recent fall.  No other fractures.  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  Grade 1 slip L4 on L5 with mild disc degeneration at L3-4 and L4-5. Atherosclerotic disease in the aorta.  IMPRESSION: Moderate to severe compression fracture of L1, with progression since 10/08/2010.  Per CMS PQRS reporting requirements (PQRS Measure 24): Given the patient's age of greater than 50 and the fracture site (hip, distal radius, or spine), the patient should be tested for osteoporosis using DXA, and the appropriate treatment considered based on the DXA results.  Original Report Authenticated By: Camelia Phenes, M.D.   Dg Pelvis 1-2 Views  01/05/2011  *RADIOLOGY REPORT*  Clinical Data: Fall.  Pain  PELVIS - 1-2 VIEW  Comparison: 10/18/2010  Findings:  Both hips are in normal alignment.  No significant hip degeneration.  Negative for pelvic or hip fracture. Atherosclerotic disease in the iliac arteries.  IMPRESSION: Negative for fracture.  Original Report Authenticated By: Camelia Phenes, M.D.   Ct Head Wo Contrast  01/05/2011  *RADIOLOGY REPORT*  Clinical Data:  Fall.  Head and neck pain.  CT HEAD WITHOUT CONTRAST CT CERVICAL SPINE WITHOUT CONTRAST  Technique:  Multidetector CT imaging of the head and cervical spine was performed following the standard protocol without intravenous contrast.  Multiplanar CT image reconstructions of the cervical spine were also generated.  Comparison:  10/08/2010 and previous  CT HEAD  Findings: The brain shows generalized atrophy with extensive chronic small vessel disease throughout the white matter.  No sign of acute infarction, mass lesion, hemorrhage, hydrocephalus or extra-axial collection.  No skull fracture.  Sinuses, middle ears and mastoids are clear.  IMPRESSION: Atrophy and chronic small vessel disease.  No acute or traumatic finding.  No change since previous study.  CT CERVICAL SPINE  Findings: No evidence of fracture or traumatic malalignment.  No significant degenerative change either of the discs or facets.  No focal lesion.  IMPRESSION: Remarkably normal scan for person of this age.  No acute or traumatic finding.  Only minimal degenerative changes.  Original Report Authenticated By: Thomasenia Sales, M.D.   Ct Cervical Spine Wo Contrast  01/05/2011  *RADIOLOGY REPORT*  Clinical Data:  Fall.  Head and neck pain.  CT HEAD WITHOUT CONTRAST CT CERVICAL SPINE WITHOUT CONTRAST  Technique:  Multidetector CT imaging of the head and cervical spine was performed following the standard protocol without intravenous contrast.  Multiplanar CT image reconstructions of the cervical spine were also generated.  Comparison:  10/08/2010 and previous  CT HEAD  Findings: The brain shows generalized atrophy with extensive chronic  small vessel disease throughout the white matter.  No sign of acute infarction, mass lesion, hemorrhage, hydrocephalus or extra-axial collection.  No skull fracture.  Sinuses, middle ears and mastoids are clear.  IMPRESSION: Atrophy and chronic small vessel disease.  No acute or traumatic finding.  No change since previous study.  CT CERVICAL SPINE  Findings: No evidence of fracture or traumatic malalignment.  No significant degenerative change either of the discs or facets.  No focal lesion.  IMPRESSION: Remarkably normal scan for person of this age.  No acute or traumatic finding.  Only minimal degenerative changes.  Original Report Authenticated By: Thomasenia Sales, M.D.     Assessment & Plan by Problem:  #1. Worsening of previous compression fracture- She may be a good candidate for ballon Kyphoplasty for better pain control.  Consult appropriate doctor in am.  Start scheduled stool softener because of constipation on narcotics in past.  Consider scheduled pain meds. #2.  Osteoporosis-  Patient is on Vitamin D (?calcium too), but a bisphosphonate needs to be added.  Consider weekly dosing.  Also patient often misses her vitamins.  Patient needs education about the importance of calcium and vitamin D.   #3. H/o peptic ulcer disease.  Stop Nsaids since bisphosphonate will be started.  #4. Hyponatremia- likely secondary to mild dehydration.  ivf  Patient Active Problem List  Diagnoses  . Compression fracture of L1 lumbar vertebra  . Fall  . Hyponatremia  . Osteoporosis       Greater than 70 minutes was spent on direct patient care and coordination of care.  Doria Fern 01/05/2011, 11:22 PM

## 2011-01-05 NOTE — ED Notes (Signed)
[id number]<AV> Expected date:01/05/11<BR> Expected time: 5:00 PM<BR> Means of arrival:Ambulance<BR> Comments:<BR> EMS 120 GC - fall/back pain

## 2011-01-05 NOTE — ED Notes (Signed)
Pt. Started vomiting while MD in room.  4mg . Of Zofran ordered IV.

## 2011-01-05 NOTE — ED Notes (Signed)
Patient had significant trouble coming to a sitting position. Attempted to assist patient to stand. Patient unable to stand. Patient states "she wants to be admitted"

## 2011-01-05 NOTE — ED Notes (Signed)
Pt. Taken off backboard and examined by Dr. Effie Shy.

## 2011-01-06 ENCOUNTER — Emergency Department (HOSPITAL_COMMUNITY): Payer: Medicare Other

## 2011-01-06 ENCOUNTER — Encounter (HOSPITAL_COMMUNITY): Payer: Self-pay

## 2011-01-06 DIAGNOSIS — S32009A Unspecified fracture of unspecified lumbar vertebra, initial encounter for closed fracture: Secondary | ICD-10-CM

## 2011-01-06 DIAGNOSIS — G8911 Acute pain due to trauma: Secondary | ICD-10-CM

## 2011-01-06 LAB — CBC
HCT: 32 % — ABNORMAL LOW (ref 36.0–46.0)
Hemoglobin: 11.2 g/dL — ABNORMAL LOW (ref 12.0–15.0)
MCH: 32.2 pg (ref 26.0–34.0)
MCHC: 35 g/dL (ref 30.0–36.0)
MCV: 92 fL (ref 78.0–100.0)
Platelets: 207 10*3/uL (ref 150–400)
RBC: 3.48 MIL/uL — ABNORMAL LOW (ref 3.87–5.11)
RDW: 13.5 % (ref 11.5–15.5)
WBC: 6.3 10*3/uL (ref 4.0–10.5)

## 2011-01-06 LAB — BASIC METABOLIC PANEL
BUN: 6 mg/dL (ref 6–23)
CO2: 23 mEq/L (ref 19–32)
Calcium: 9 mg/dL (ref 8.4–10.5)
Chloride: 101 mEq/L (ref 96–112)
Creatinine, Ser: 0.47 mg/dL — ABNORMAL LOW (ref 0.50–1.10)
GFR calc Af Amer: >90 mL/min (ref >90)
GFR calc non Af Amer: 90 mL/min — ABNORMAL LOW (ref >90)
Glucose, Bld: 82 mg/dL (ref 70–99)
Potassium: 3.4 mEq/L — ABNORMAL LOW (ref 3.5–5.1)
Sodium: 131 mEq/L — ABNORMAL LOW (ref 135–145)

## 2011-01-06 MED ORDER — VITAMIN D3 25 MCG (1000 UNIT) PO TABS
1000.0000 [IU] | ORAL_TABLET | Freq: Every day | ORAL | Status: DC
  Administered 2011-01-06 – 2011-01-11 (×6): 1000 [IU] via ORAL
  Filled 2011-01-06 (×7): qty 1

## 2011-01-06 MED ORDER — SENNA 8.6 MG PO TABS
1.0000 | ORAL_TABLET | Freq: Two times a day (BID) | ORAL | Status: DC
  Administered 2011-01-06 – 2011-01-11 (×11): 8.6 mg via ORAL
  Filled 2011-01-06 (×12): qty 1

## 2011-01-06 MED ORDER — BISACODYL 5 MG PO TBEC
5.0000 mg | DELAYED_RELEASE_TABLET | Freq: Every day | ORAL | Status: DC | PRN
  Administered 2011-01-09: 5 mg via ORAL
  Filled 2011-01-06 (×2): qty 1

## 2011-01-06 MED ORDER — ALUM & MAG HYDROXIDE-SIMETH 200-200-20 MG/5ML PO SUSP: 30.0000 mL | Freq: Four times a day (QID) | ORAL | Status: DC | PRN

## 2011-01-06 MED ORDER — FAMOTIDINE 20 MG PO TABS
20.0000 mg | ORAL_TABLET | Freq: Two times a day (BID) | ORAL | Status: DC
  Administered 2011-01-06 – 2011-01-11 (×11): 20 mg via ORAL
  Filled 2011-01-06 (×12): qty 1

## 2011-01-06 MED ORDER — GADOBENATE DIMEGLUMINE 529 MG/ML IV SOLN
10.0000 mL | Freq: Once | INTRAVENOUS | Status: AC | PRN
  Administered 2011-01-06: 10 mL via INTRAVENOUS

## 2011-01-06 MED ORDER — ONDANSETRON HCL 4 MG PO TABS: 4.0000 mg | ORAL_TABLET | Freq: Four times a day (QID) | ORAL | Status: DC | PRN

## 2011-01-06 MED ORDER — ASPIRIN EC 81 MG PO TBEC
81.0000 mg | DELAYED_RELEASE_TABLET | Freq: Every day | ORAL | Status: DC
  Administered 2011-01-06 – 2011-01-11 (×6): 81 mg via ORAL
  Filled 2011-01-06 (×7): qty 1

## 2011-01-06 MED ORDER — HYDROCODONE-ACETAMINOPHEN 5-325 MG PO TABS
1.0000 | ORAL_TABLET | Freq: Four times a day (QID) | ORAL | Status: DC | PRN
  Administered 2011-01-07 – 2011-01-09 (×5): 1 via ORAL
  Administered 2011-01-10: 2 via ORAL
  Filled 2011-01-06 (×2): qty 1
  Filled 2011-01-06: qty 2
  Filled 2011-01-06 (×3): qty 1

## 2011-01-06 MED ORDER — MIRTAZAPINE 15 MG PO TABS
15.0000 mg | ORAL_TABLET | Freq: Every day | ORAL | Status: DC
  Administered 2011-01-06 – 2011-01-10 (×5): 15 mg via ORAL
  Filled 2011-01-06 (×6): qty 1

## 2011-01-06 MED ORDER — THERA M PLUS PO TABS
1.0000 | ORAL_TABLET | Freq: Every day | ORAL | Status: DC
  Administered 2011-01-06 – 2011-01-11 (×6): 1 via ORAL
  Filled 2011-01-06 (×6): qty 1

## 2011-01-06 NOTE — ED Notes (Signed)
Pt. Bed was wet, was cleaned and changed.

## 2011-01-06 NOTE — Consult Note (Signed)
Reason for Consult:Compression fracture L1 vertebrae Referring Physician: Dr. Gearldine Shown is an 77 y.o. female.  HPI: Pt fell decorating her house for Christmas yesterday 01/05/11.   Past Medical History  Diagnosis Date  . Peptic ulcer disease   . Osteoporosis   . Depression   . Anxiety   . Hyperlipemia   . IgG monoclonal gammopathy   . Compression fracture of L2 lumbar vertebra     Past Surgical History  Procedure Date  . Cervical polyp removal   . Dilation and curettage of uterus   . Tonsillectomy     History reviewed. No pertinent family history.  Social History:  reports that she has been smoking Cigarettes.  She has been smoking about 1 pack per day. She has never used smokeless tobacco. She reports that she does not drink alcohol or use illicit drugs.  Allergies: No Known Allergies  Medications: I have reviewed the patient's current medications.  Results for orders placed during the hospital encounter of 01/05/11 (from the past 48 hour(s))  CBC     Status: Abnormal   Collection Time   01/05/11  5:27 PM      Component Value Range Comment   WBC 12.9 (*) 4.0 - 10.5 (K/uL)    RBC 3.72 (*) 3.87 - 5.11 (MIL/uL)    Hemoglobin 12.1  12.0 - 15.0 (g/dL)    HCT 16.1 (*) 09.6 - 46.0 (%)    MCV 91.7  78.0 - 100.0 (fL)    MCH 32.5  26.0 - 34.0 (pg)    MCHC 35.5  30.0 - 36.0 (g/dL)    RDW 04.5  40.9 - 81.1 (%)    Platelets 210  150 - 400 (K/uL)   DIFFERENTIAL     Status: Abnormal   Collection Time   01/05/11  5:27 PM      Component Value Range Comment   Neutrophils Relative 84 (*) 43 - 77 (%)    Neutro Abs 10.8 (*) 1.7 - 7.7 (K/uL)    Lymphocytes Relative 10 (*) 12 - 46 (%)    Lymphs Abs 1.3  0.7 - 4.0 (K/uL)    Monocytes Relative 6  3 - 12 (%)    Monocytes Absolute 0.8  0.1 - 1.0 (K/uL)    Eosinophils Relative 0  0 - 5 (%)    Eosinophils Absolute 0.0  0.0 - 0.7 (K/uL)    Basophils Relative 0  0 - 1 (%)    Basophils Absolute 0.0  0.0 - 0.1 (K/uL)     BASIC METABOLIC PANEL     Status: Abnormal   Collection Time   01/05/11  5:27 PM      Component Value Range Comment   Sodium 123 (*) 135 - 145 (mEq/L)    Potassium 4.0  3.5 - 5.1 (mEq/L)    Chloride 92 (*) 96 - 112 (mEq/L)    CO2 23  19 - 32 (mEq/L)    Glucose, Bld 110 (*) 70 - 99 (mg/dL)    BUN 9  6 - 23 (mg/dL)    Creatinine, Ser 9.14  0.50 - 1.10 (mg/dL)    Calcium 9.2  8.4 - 10.5 (mg/dL)    GFR calc non Af Amer 87 (*) >90 (mL/min)    GFR calc Af Amer >90  >90 (mL/min)   URINALYSIS, ROUTINE W REFLEX MICROSCOPIC     Status: Abnormal   Collection Time   01/05/11  7:40 PM      Component Value Range  Comment   Color, Urine YELLOW  YELLOW     APPearance CLEAR  CLEAR     Specific Gravity, Urine 1.012  1.005 - 1.030     pH 7.0  5.0 - 8.0     Glucose, UA NEGATIVE  NEGATIVE (mg/dL)    Hgb urine dipstick SMALL (*) NEGATIVE     Bilirubin Urine NEGATIVE  NEGATIVE     Ketones, ur 15 (*) NEGATIVE (mg/dL)    Protein, ur NEGATIVE  NEGATIVE (mg/dL)    Urobilinogen, UA 0.2  0.0 - 1.0 (mg/dL)    Nitrite NEGATIVE  NEGATIVE     Leukocytes, UA TRACE (*) NEGATIVE    URINE MICROSCOPIC-ADD ON     Status: Abnormal   Collection Time   01/05/11  7:40 PM      Component Value Range Comment   WBC, UA 0-2  <3 (WBC/hpf)    RBC / HPF 0-2  <3 (RBC/hpf)    Bacteria, UA FEW (*) RARE     Casts HYALINE CASTS (*) NEGATIVE    BASIC METABOLIC PANEL     Status: Abnormal   Collection Time   01/06/11  5:00 AM      Component Value Range Comment   Sodium 131 (*) 135 - 145 (mEq/L) DELTA CHECK NOTED   Potassium 3.4 (*) 3.5 - 5.1 (mEq/L)    Chloride 101  96 - 112 (mEq/L) DELTA CHECK NOTED   CO2 23  19 - 32 (mEq/L)    Glucose, Bld 82  70 - 99 (mg/dL)    BUN 6  6 - 23 (mg/dL)    Creatinine, Ser 1.19 (*) 0.50 - 1.10 (mg/dL)    Calcium 9.0  8.4 - 10.5 (mg/dL)    GFR calc non Af Amer 90 (*) >90 (mL/min)    GFR calc Af Amer >90  >90 (mL/min)   CBC     Status: Abnormal   Collection Time   01/06/11  5:00 AM       Component Value Range Comment   WBC 6.3  4.0 - 10.5 (K/uL)    RBC 3.48 (*) 3.87 - 5.11 (MIL/uL)    Hemoglobin 11.2 (*) 12.0 - 15.0 (g/dL)    HCT 14.7 (*) 82.9 - 46.0 (%)    MCV 92.0  78.0 - 100.0 (fL)    MCH 32.2  26.0 - 34.0 (pg)    MCHC 35.0  30.0 - 36.0 (g/dL)    RDW 56.2  13.0 - 86.5 (%)    Platelets 207  150 - 400 (K/uL)     Dg Chest 2 View  01/05/2011  *RADIOLOGY REPORT*  Clinical Data: Fall  CHEST - 2 VIEW  Comparison: None.  Findings: Prominent lung markings suggestive of chronic lung disease.  Negative for infiltrate or effusion.  Negative for heart failure.  Heart size is mildly enlarged.  Compression fracture of L1.  See lumbar spine report.  IMPRESSION: No active cardiopulmonary disease.  Original Report Authenticated By: Camelia Phenes, M.D.   Dg Ribs Unilateral Right  01/05/2011  *RADIOLOGY REPORT*  Clinical Data: Fall.  Pain  RIGHT RIBS - 2 VIEW  Comparison: None.  Findings: Negative for rib fracture on the right.  No focal bony lesion.  IMPRESSION: Negative  Original Report Authenticated By: Camelia Phenes, M.D.   Dg Lumbar Spine Complete  01/05/2011  *RADIOLOGY REPORT*  Clinical Data: Fall.  Back pain  LUMBAR SPINE - COMPLETE 4+ VIEW  Comparison: 10/08/2010  Findings: Moderate to severe fracture of L1  has progressed in severity since the prior study.  This may be related to the recent fall.  No other fractures.  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  Grade 1 slip L4 on L5 with mild disc degeneration at L3-4 and L4-5. Atherosclerotic disease in the aorta.  IMPRESSION: Moderate to severe compression fracture of L1, with progression since 10/08/2010.  Per CMS PQRS reporting requirements (PQRS Measure 24): Given the patient's age of greater than 50 and the fracture site (hip, distal radius, or spine), the patient should be tested for osteoporosis using DXA, and the appropriate treatment considered based on the DXA results.   Original Report Authenticated By: Camelia Phenes, M.D.   Dg Pelvis 1-2 Views  01/05/2011  *RADIOLOGY REPORT*  Clinical Data: Fall.  Pain  PELVIS - 1-2 VIEW  Comparison: 10/18/2010  Findings: Both hips are in normal alignment.  No significant hip degeneration.  Negative for pelvic or hip fracture. Atherosclerotic disease in the iliac arteries.  IMPRESSION: Negative for fracture.  Original Report Authenticated By: Camelia Phenes, M.D.   Ct Head Wo Contrast  01/05/2011  *RADIOLOGY REPORT*  Clinical Data:  Fall.  Head and neck pain.  CT HEAD WITHOUT CONTRAST CT CERVICAL SPINE WITHOUT CONTRAST  Technique:  Multidetector CT imaging of the head and cervical spine was performed following the standard protocol without intravenous contrast.  Multiplanar CT image reconstructions of the cervical spine were also generated.  Comparison:  10/08/2010 and previous  CT HEAD  Findings: The brain shows generalized atrophy with extensive chronic small vessel disease throughout the white matter.  No sign of acute infarction, mass lesion, hemorrhage, hydrocephalus or extra-axial collection.  No skull fracture.  Sinuses, middle ears and mastoids are clear.  IMPRESSION: Atrophy and chronic small vessel disease.  No acute or traumatic finding.  No change since previous study.  CT CERVICAL SPINE  Findings: No evidence of fracture or traumatic malalignment.  No significant degenerative change either of the discs or facets.  No focal lesion.  IMPRESSION: Remarkably normal scan for person of this age.  No acute or traumatic finding.  Only minimal degenerative changes.  Original Report Authenticated By: Thomasenia Sales, M.D.   Ct Cervical Spine Wo Contrast  01/05/2011  *RADIOLOGY REPORT*  Clinical Data:  Fall.  Head and neck pain.  CT HEAD WITHOUT CONTRAST CT CERVICAL SPINE WITHOUT CONTRAST  Technique:  Multidetector CT imaging of the head and cervical spine was performed following the standard protocol without intravenous contrast.   Multiplanar CT image reconstructions of the cervical spine were also generated.  Comparison:  10/08/2010 and previous  CT HEAD  Findings: The brain shows generalized atrophy with extensive chronic small vessel disease throughout the white matter.  No sign of acute infarction, mass lesion, hemorrhage, hydrocephalus or extra-axial collection.  No skull fracture.  Sinuses, middle ears and mastoids are clear.  IMPRESSION: Atrophy and chronic small vessel disease.  No acute or traumatic finding.  No change since previous study.  CT CERVICAL SPINE  Findings: No evidence of fracture or traumatic malalignment.  No significant degenerative change either of the discs or facets.  No focal lesion.  IMPRESSION: Remarkably normal scan for person of this age.  No acute or traumatic finding.  Only minimal degenerative changes.  Original Report Authenticated By: Thomasenia Sales, M.D.   Mr Lumbar Spine W Wo Contrast  01/06/2011  *RADIOLOGY REPORT*  Clinical Data: Evaluate compression fractures.  MRI  LUMBAR SPINE WITHOUT AND WITH CONTRAST  Technique:  Multiplanar and multiecho pulse sequences of the lumbar spine were obtained without and with intravenous contrast.  Contrast: 10mL MULTIHANCE GADOBENATE DIMEGLUMINE 529 MG/ML IV SOLN  Comparison: 01/05/2011 plain film exam. 10/19/2008 CT.  No comparison MR.  Findings: Last fully open disc space is labeled L5-S1.  Present examination incorporates from upper T11 through the lower sacral region.  Conus L1-2 level.  Visualized paravertebral structures demonstrate adrenal hyperplasia otherwise unremarkable.  Heterogeneous bone marrow probably related to osteoporosis. Abnormal signal throughout the L3 vertebra greater to left midline. Appearance is most suggestive of hemangioma (when combined with CT appearance).  Nonspecific rounded 8 mm lesion within the left aspect of the L2 vertebral body does not demonstrate characteristics of a hemangioma. Malignancy not excluded.  Stability of this  can be confirmed on follow-up  Anterior wedge compression fracture of the L1 vertebral body with 90% loss of height centrally, 80% loss of height anteriorly and with retropulsion of the posterior-superior aspect of the compressed vertebra contributing to mild spinal stenosis greater on the left.  This approaches but does not compress the conus.  Mild paravertebral soft tissue prominence may represent hemorrhage.  No epidural extension.  T12 superior endplate compression fracture with 15 - 20% loss of height.  This extends into the right pedicle.  Minimal retropulsion of the right posterior lateral aspect of the compressed superior endplate without significant spinal stenosis.  Tiny amount of epidural blood extends from the right T12 pedicle level superiorly to the T11-T12 disc space level.  Subtle L4 superior endplate compression fracture greater to the right of midline.  Per CMS PQRI reporting requirements (PQRI Measure 24): Given the patient's age of greater than 50 and the fracture site (hip, distal radius, or spine), the patient should be tested for osteoporosis using DXA, and the appropriate treatment considered based on the DXA results.  T11-12:  Mild facet joint degenerative changes.  T12-L1:  Mild spinal stenosis greater on the left as discussed above.  L1-2:  Minimal facet joint degenerative changes.  Minimal bulge.  L2-3:  Mild facet joint degenerative changes.  Mild bulge.  Mild ligamentum flavum hypertrophy.  Very mild spinal stenosis with a trefoil appearance.  L3-4:  Moderate bilateral facet joint degenerative changes.  3.8 mm anterior slip of L3.  Bulge/broad-based protrusion slightly greater right lateral position with mild encroachment upon the exiting right L3 nerve root which does not appear significantly compressed. Ligamentum flavum hypertrophy.  Multifactorial mild to slightly moderate spinal stenosis and lateral recess stenosis.  L4-5:  Moderate facet joint degenerative changes.  Ligamentum  flavum hypertrophy greater on the left with slight impression upon the left lateral aspect of the thecal sac.  2.4 mm anterior slip of L4.  Bulge.  Mild to slightly moderate superior lateral recess stenosis.  Mild spinal stenosis.  L5-S1:  Facet joint degenerative changes.  Mild bulge.  No significant spinal stenosis or foraminal narrowing.  IMPRESSION: Compression fractures of T12, L1 and L4 as detailed above.  There is a small amount of epidural blood on the right extending from the T11-12 disc space to the right T12 pedicle level without significant associated mass effect.  Probable hemangioma L3 vertebral body.  Nonspecific 8 mm lesion within the left aspect of the L2 vertebral body.  Stability can be confirmed on follow-up.  Scattered degenerative changes as detailed above with multifactorial spinal stenosis and lateral recess stenosis most prominent at the L3-4 level.  Original Report Authenticated By:  Fuller Canada, M.D.    Review of Systems  Constitutional: Negative.   HENT: Negative.   Eyes: Negative.   Respiratory: Negative for cough, hemoptysis, sputum production, shortness of breath and wheezing.   Cardiovascular: Negative for chest pain, claudication and leg swelling.  Gastrointestinal: Negative.   Genitourinary: Negative.   Musculoskeletal: Positive for back pain and falls.  Skin: Negative.   Neurological: Negative.   Endo/Heme/Allergies: Negative.   Psychiatric/Behavioral: Negative.    Blood pressure 120/70, pulse 78, temperature 98 F (36.7 C), temperature source Oral, resp. rate 20, height 5\' 1"  (1.549 m), weight 49.896 kg (110 lb), SpO2 97.00%. Physical Exam  Constitutional: She appears well-developed.  HENT:  Mouth/Throat: Oropharynx is clear and moist.  Eyes: Pupils are equal, round, and reactive to light.  Neck: Neck supple.  Cardiovascular: Normal heart sounds and intact distal pulses.   No murmur heard. Respiratory: Breath sounds normal. No respiratory distress.  She has no wheezes. She has no rales. She exhibits no tenderness.  GI: Soft. She exhibits no distension and no mass. There is no tenderness. There is no rebound and no guarding.  Musculoskeletal: She exhibits tenderness.  Neurological: She is alert. She has normal strength. No sensory deficit.       No major neurological deficit in lowers.    Assessment/Plan: Ambulate with therapy in lumbosacral corset.   Rhen Kawecki A 01/06/2011, 5:39 PM

## 2011-01-06 NOTE — ED Notes (Signed)
Patient transported to MRI, agrees to test

## 2011-01-06 NOTE — ED Notes (Signed)
Patient off the floor in MRI

## 2011-01-06 NOTE — Consult Note (Signed)
Reason for Consult:Fall, L1 fx Referring Physician: Ashley Royalty   HPI: Dana Bush is an 77 y.o. female who was previously admitted in September for back fx, noted to be L2 at that time. She refused Kyphoplasty at that time and has been on po rx for osteoporosis prevention. However, yesterday she reports putting up some christmas decorations and lost her balance and "fell" backwards from standing.  She denies hitting her head. Denies LOC before or after the fall. She came to the ED for c/o back pain. Medical team is admitting and has ordered MRI. Request trauma eval as she fell. She denies any other injuries or c/o.  Past Medical History:  Past Medical History  Diagnosis Date  . Peptic ulcer disease   . Osteoporosis   . Depression   . Anxiety   . Hyperlipemia   . IgG monoclonal gammopathy     Surgical History:  Past Surgical History  Procedure Date  . Cervical polyp removal   . Dilation and curettage of uterus   . Tonsillectomy     Family History: No family history on file.  Social History:  reports that she has been smoking.  She does not have any smokeless tobacco history on file. She reports that she does not drink alcohol. Her drug history not on file.  Allergies: No Known Allergies  Medications: I have reviewed the patient's current medications.  ROS: See HPI for pertinent findings, otherwise complete 10 system review negative.  Physical Exam: Blood pressure 113/58, pulse 65, temperature 99 F (37.2 C), temperature source Oral, resp. rate 18, SpO2 96.00%.  General Appearance:  Alert, cooperative, no distress, appears stated age  Head:  Normocephalic, without obvious abnormality, atraumatic  ENT: Unremarkable  Neck: Supple, symmetrical, trachea midline, no adenopathy, thyroid: not enlarged, symmetric, no tenderness/mass/nodules  Lungs:   Clear to auscultation bilaterally, no w/r/r, respirations unlabored without use of accessory muscles.  Chest Wall:  No tenderness  or deformity  Heart:  Regular rate and rhythm, S1, S2 normal, no murmur, rub or gallop. Carotids 2+ without bruit.  Abdomen:   Soft, non-tender, non distended. Bowel sounds active all four quadrants,  no masses, no organomegaly.  Genitalia:  Normal. No hernias  Rectal:  Deferred.  Extremities: Extremities normal, atraumatic, no cyanosis or edema  Pulses: 2+ and symmetric  Skin: Skin color, texture, turgor normal, no rashes or lesions  Neurologic: Normal affect, no gross deficits.     Labs: CBC  Basename 01/06/11 0500 01/05/11 1727  WBC 6.3 12.9*  HGB 11.2* 12.1  HCT 32.0* 34.1*  PLT 207 210   MET  Basename 01/06/11 0500 01/05/11 1727  NA 131* 123*  K 3.4* 4.0  CL 101 92*  CO2 23 23  GLUCOSE 82 110*  BUN 6 9  CREATININE 0.47* 0.51  CALCIUM 9.0 9.2   No results found for this basename: PROT,ALBUMIN,AST,ALT,ALKPHOS,BILITOT,BILIDIR,IBILI,LIPASE in the last 72 hours PT/INR No results found for this basename: LABPROT:2,INR:2 in the last 72 hours ABG No results found for this basename: PHART:2,PCO2:2,PO2:2,HCO3:2 in the last 72 hours    Dg Chest 2 View  01/05/2011  *RADIOLOGY REPORT*  Clinical Data: Fall  CHEST - 2 VIEW  Comparison: None.  Findings: Prominent lung markings suggestive of chronic lung disease.  Negative for infiltrate or effusion.  Negative for heart failure.  Heart size is mildly enlarged.  Compression fracture of L1.  See lumbar spine report.  IMPRESSION: No active cardiopulmonary disease.  Original Report Authenticated By: Camelia Phenes, M.D.  Dg Ribs Unilateral Right  01/05/2011  *RADIOLOGY REPORT*  Clinical Data: Fall.  Pain  RIGHT RIBS - 2 VIEW  Comparison: None.  Findings: Negative for rib fracture on the right.  No focal bony lesion.  IMPRESSION: Negative  Original Report Authenticated By: Camelia Phenes, M.D.   Dg Lumbar Spine Complete  01/05/2011  *RADIOLOGY REPORT*  Clinical Data: Fall.  Back pain  LUMBAR SPINE - COMPLETE 4+ VIEW  Comparison:  10/08/2010  Findings: Moderate to severe fracture of L1 has progressed in severity since the prior study.  This may be related to the recent fall.  No other fractures.  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  Grade 1 slip L4 on L5 with mild disc degeneration at L3-4 and L4-5. Atherosclerotic disease in the aorta.  IMPRESSION: Moderate to severe compression fracture of L1, with progression since 10/08/2010.  Per CMS PQRS reporting requirements (PQRS Measure 24): Given the patient's age of greater than 50 and the fracture site (hip, distal radius, or spine), the patient should be tested for osteoporosis using DXA, and the appropriate treatment considered based on the DXA results.  Original Report Authenticated By: Camelia Phenes, M.D.   Dg Pelvis 1-2 Views  01/05/2011  *RADIOLOGY REPORT*  Clinical Data: Fall.  Pain  PELVIS - 1-2 VIEW  Comparison: 10/18/2010  Findings: Both hips are in normal alignment.  No significant hip degeneration.  Negative for pelvic or hip fracture. Atherosclerotic disease in the iliac arteries.  IMPRESSION: Negative for fracture.  Original Report Authenticated By: Camelia Phenes, M.D.   Ct Head Wo Contrast  01/05/2011  *RADIOLOGY REPORT*  Clinical Data:  Fall.  Head and neck pain.  CT HEAD WITHOUT CONTRAST CT CERVICAL SPINE WITHOUT CONTRAST  Technique:  Multidetector CT imaging of the head and cervical spine was performed following the standard protocol without intravenous contrast.  Multiplanar CT image reconstructions of the cervical spine were also generated.  Comparison:  10/08/2010 and previous  CT HEAD  Findings: The brain shows generalized atrophy with extensive chronic small vessel disease throughout the white matter.  No sign of acute infarction, mass lesion, hemorrhage, hydrocephalus or extra-axial collection.  No skull fracture.  Sinuses, middle ears and mastoids are clear.  IMPRESSION: Atrophy and chronic small vessel  disease.  No acute or traumatic finding.  No change since previous study.  CT CERVICAL SPINE  Findings: No evidence of fracture or traumatic malalignment.  No significant degenerative change either of the discs or facets.  No focal lesion.  IMPRESSION: Remarkably normal scan for person of this age.  No acute or traumatic finding.  Only minimal degenerative changes.  Original Report Authenticated By: Thomasenia Sales, M.D.   Ct Cervical Spine Wo Contrast  01/05/2011  *RADIOLOGY REPORT*  Clinical Data:  Fall.  Head and neck pain.  CT HEAD WITHOUT CONTRAST CT CERVICAL SPINE WITHOUT CONTRAST  Technique:  Multidetector CT imaging of the head and cervical spine was performed following the standard protocol without intravenous contrast.  Multiplanar CT image reconstructions of the cervical spine were also generated.  Comparison:  10/08/2010 and previous  CT HEAD  Findings: The brain shows generalized atrophy with extensive chronic small vessel disease throughout the white matter.  No sign of acute infarction, mass lesion, hemorrhage, hydrocephalus or extra-axial collection.  No skull fracture.  Sinuses, middle ears and mastoids are clear.  IMPRESSION: Atrophy and chronic small vessel disease.  No acute or traumatic  finding.  No change since previous study.  CT CERVICAL SPINE  Findings: No evidence of fracture or traumatic malalignment.  No significant degenerative change either of the discs or facets.  No focal lesion.  IMPRESSION: Remarkably normal scan for person of this age.  No acute or traumatic finding.  Only minimal degenerative changes.  Original Report Authenticated By: Thomasenia Sales, M.D.    Assessment/Plan: Fall L1/L2 fx, progression from 9/14 imaging. No other acute injury, cleared from trauma standpoint. Recommend Ortho Spine or Neurosurgery to eval for management. Call if needed.  Raechell Singleton J 01/06/2011, 7:58 AM

## 2011-01-06 NOTE — ED Notes (Signed)
Patient back from MRI, no complaints

## 2011-01-06 NOTE — ED Notes (Signed)
Pt is refusing to go to MRI at this time, wants to speak to her husband first

## 2011-01-06 NOTE — Progress Notes (Signed)
Subjective: Patient states that she has no pain as long as she remains in an immobilized position. However any movement elicits pain in the back. The patient reinforces that this was a mechanical fall and she had no syncope or dizziness or loss of consciousness. Objective: Filed Vitals:   01/05/11 2251 01/05/11 2341 01/06/11 0335 01/06/11 0650  BP:  99/47 116/53 113/58  Pulse:  65 96 65  Temp:  97.9 F (36.6 C) 98.8 F (37.1 C) 99 F (37.2 C)  TempSrc:  Oral Oral Oral  Resp:  18 18 18   SpO2: 94% 95% 96% 96%   Weight change:   Intake/Output Summary (Last 24 hours) at 01/06/11 9562 Last data filed at 01/05/11 2219  Gross per 24 hour  Intake      0 ml  Output    400 ml  Net   -400 ml    General: Alert, awake, oriented x3, in no acute distress.  HEENT: [place]/AT PEERL, EOMI Neck: Trachea midline,  no masses, no thyromegal,y no JVD, no carotid bruit OROPHARYNX:  Moist, No exudate/ erythema/lesions.  Heart: Regular rate and rhythm, without murmurs, rubs, gallops, PMI non-displaced, no heaves or thrills on palpation.  Lungs: Clear to auscultation, no wheezing or rhonchi noted. No increased vocal fremitus resonant to percussion  Abdomen: Soft, nontender, nondistended, positive bowel sounds, no masses no hepatosplenomegaly noted..  Neuro: No focal neurological deficits noted cranial nerves II through XII grossly intact. DTRs 2+ bilaterally upper and lower extremities. Strength 5 out of 5 in bilateral upper and lower extremities. Musculoskeletal: No warm swelling or erythema around joints, spinal tenderness noted in lumbar region . Psychiatric: Patient alert and oriented x3, good insight and cognition, good recent to remote recall. Lymph node survey: No cervical axillary or inguinal lymphadenopathy noted.   Lab Results:  Basename 01/06/11 0500 01/05/11 1727  NA 131* 123*  K 3.4* 4.0  CL 101 92*  CO2 23 23  GLUCOSE 82 110*  BUN 6 9  CREATININE 0.47* 0.51  CALCIUM 9.0 9.2  MG -- --    PHOS -- --   No results found for this basename: AST:2,ALT:2,ALKPHOS:2,BILITOT:2,PROT:2,ALBUMIN:2 in the last 72 hours No results found for this basename: LIPASE:2,AMYLASE:2 in the last 72 hours  Basename 01/06/11 0500 01/05/11 1727  WBC 6.3 12.9*  NEUTROABS -- 10.8*  HGB 11.2* 12.1  HCT 32.0* 34.1*  MCV 92.0 91.7  PLT 207 210   No results found for this basename: CKTOTAL:3,CKMB:3,CKMBINDEX:3,TROPONINI:3 in the last 72 hours No components found with this basename: POCBNP:3 No results found for this basename: DDIMER:2 in the last 72 hours No results found for this basename: HGBA1C:2 in the last 72 hours No results found for this basename: CHOL:2,HDL:2,LDLCALC:2,TRIG:2,CHOLHDL:2,LDLDIRECT:2 in the last 72 hours No results found for this basename: TSH,T4TOTAL,FREET3,T3FREE,THYROIDAB in the last 72 hours No results found for this basename: VITAMINB12:2,FOLATE:2,FERRITIN:2,TIBC:2,IRON:2,RETICCTPCT:2 in the last 72 hours  Micro Results: No results found for this or any previous visit (from the past 240 hour(s)).  Studies/Results: Dg Chest 2 View  01/05/2011  *RADIOLOGY REPORT*  Clinical Data: Fall  CHEST - 2 VIEW  Comparison: None.  Findings: Prominent lung markings suggestive of chronic lung disease.  Negative for infiltrate or effusion.  Negative for heart failure.  Heart size is mildly enlarged.  Compression fracture of L1.  See lumbar spine report.  IMPRESSION: No active cardiopulmonary disease.  Original Report Authenticated By: Camelia Phenes, M.D.   Dg Ribs Unilateral Right  01/05/2011  *RADIOLOGY REPORT*  Clinical  Data: Fall.  Pain  RIGHT RIBS - 2 VIEW  Comparison: None.  Findings: Negative for rib fracture on the right.  No focal bony lesion.  IMPRESSION: Negative  Original Report Authenticated By: Camelia Phenes, M.D.   Dg Lumbar Spine Complete  01/05/2011  *RADIOLOGY REPORT*  Clinical Data: Fall.  Back pain  LUMBAR SPINE - COMPLETE 4+ VIEW  Comparison: 10/08/2010  Findings:  Moderate to severe fracture of L1 has progressed in severity since the prior study.  This may be related to the recent fall.  No other fractures.  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  Grade 1 slip L4 on L5 with mild disc degeneration at L3-4 and L4-5. Atherosclerotic disease in the aorta.  IMPRESSION: Moderate to severe compression fracture of L1, with progression since 10/08/2010.  Per CMS PQRS reporting requirements (PQRS Measure 24): Given the patient's age of greater than 50 and the fracture site (hip, distal radius, or spine), the patient should be tested for osteoporosis using DXA, and the appropriate treatment considered based on the DXA results.  Original Report Authenticated By: Camelia Phenes, M.D.   Dg Pelvis 1-2 Views  01/05/2011  *RADIOLOGY REPORT*  Clinical Data: Fall.  Pain  PELVIS - 1-2 VIEW  Comparison: 10/18/2010  Findings: Both hips are in normal alignment.  No significant hip degeneration.  Negative for pelvic or hip fracture. Atherosclerotic disease in the iliac arteries.  IMPRESSION: Negative for fracture.  Original Report Authenticated By: Camelia Phenes, M.D.   Ct Head Wo Contrast  01/05/2011  *RADIOLOGY REPORT*  Clinical Data:  Fall.  Head and neck pain.  CT HEAD WITHOUT CONTRAST CT CERVICAL SPINE WITHOUT CONTRAST  Technique:  Multidetector CT imaging of the head and cervical spine was performed following the standard protocol without intravenous contrast.  Multiplanar CT image reconstructions of the cervical spine were also generated.  Comparison:  10/08/2010 and previous  CT HEAD  Findings: The brain shows generalized atrophy with extensive chronic small vessel disease throughout the white matter.  No sign of acute infarction, mass lesion, hemorrhage, hydrocephalus or extra-axial collection.  No skull fracture.  Sinuses, middle ears and mastoids are clear.  IMPRESSION: Atrophy and chronic small vessel disease.  No acute or  traumatic finding.  No change since previous study.  CT CERVICAL SPINE  Findings: No evidence of fracture or traumatic malalignment.  No significant degenerative change either of the discs or facets.  No focal lesion.  IMPRESSION: Remarkably normal scan for person of this age.  No acute or traumatic finding.  Only minimal degenerative changes.  Original Report Authenticated By: Thomasenia Sales, M.D.   Ct Cervical Spine Wo Contrast  01/05/2011  *RADIOLOGY REPORT*  Clinical Data:  Fall.  Head and neck pain.  CT HEAD WITHOUT CONTRAST CT CERVICAL SPINE WITHOUT CONTRAST  Technique:  Multidetector CT imaging of the head and cervical spine was performed following the standard protocol without intravenous contrast.  Multiplanar CT image reconstructions of the cervical spine were also generated.  Comparison:  10/08/2010 and previous  CT HEAD  Findings: The brain shows generalized atrophy with extensive chronic small vessel disease throughout the white matter.  No sign of acute infarction, mass lesion, hemorrhage, hydrocephalus or extra-axial collection.  No skull fracture.  Sinuses, middle ears and mastoids are clear.  IMPRESSION: Atrophy and chronic small vessel disease.  No acute or traumatic finding.  No change since previous study.  CT CERVICAL SPINE  Findings: No evidence of fracture or traumatic malalignment.  No significant degenerative change either of the discs or facets.  No focal lesion.  IMPRESSION: Remarkably normal scan for person of this age.  No acute or traumatic finding.  Only minimal degenerative changes.  Original Report Authenticated By: Thomasenia Sales, M.D.    Medications: I have reviewed the patient's current medications. Scheduled Meds:   . aspirin EC  81 mg Oral Daily  . cefTRIAXone (ROCEPHIN)  IV  1 g Intravenous Once  . cholecalciferol  1,000 Units Oral Daily  . famotidine  20 mg Oral BID  . HYDROcodone-acetaminophen  1 tablet Oral Once  . mirtazapine  15 mg Oral QHS  . multivitamins  ther. w/minerals  1 tablet Oral Daily  . ondansetron      . ondansetron      . ondansetron      . oxyCODONE  5 mg Oral TID WC  . senna  1 tablet Oral BID   Continuous Infusions:   . 0.9 % NaCl with KCl 20 mEq / L 75 mL/hr at 01/06/11 0114  . DISCONTD: sodium chloride 1,000 mL (01/05/11 2232)   PRN Meds:.alum & mag hydroxide-simeth, bisacodyl, HYDROcodone-acetaminophen, ondansetron Assessment/Plan: Patient Active Hospital Problem List: Compression fracture of L1 lumbar vertebra (01/05/2011)   Assessment: CT scan of the lumbar spine shows an extension of a previous vertebral fracture. I will ask the trauma surgeons to evaluate. I will also obtain an MRI of the lumbar area and make a decision about considering kyphoplasty.    Fall (01/05/2011)   Assessment: Patient presents with a mechanical fall and denies any syncope or near syncope.    Plan: We'll ask physical therapy to evaluate and treat.  Hyponatremia (01/05/2011)   Assessment: Patient's hyponatremia appears to be secondary to volume depletion given her decreased chloride also which has responded to IV fluids. The patient however does not give a history of decreased oral intake. We'll monitor her oral intake while hospitalized.    Osteoporosis (01/05/2011)   Assessment: The patient has been on calcium and vitamin D. I Dr. clear bone has recommended the patient on bisphosphonates. However these cannot be administered by the patient is hospitalized but recommendation will be made to her primary care physician at the time of discharge.    Fall(01/06/2011)   Assessment: Patient sustained a mechanical fall without any syncope or presyncope associated. I'll ask physical therapy evaluate and treat. I will also obtain an MRI to evaluate further the fracture sustained and the feasibility of an intervention for repair. Also ask physical therapy to evaluate and treat.     DVT prophylaxis with SCDs.   LOS: 1 day

## 2011-01-07 LAB — URINE CULTURE: Colony Count: >=100000

## 2011-01-07 LAB — BASIC METABOLIC PANEL
CO2: 22 mEq/L (ref 19–32)
Chloride: 104 mEq/L (ref 96–112)
Creatinine, Ser: 0.5 mg/dL (ref 0.50–1.10)
GFR calc Af Amer: >90 mL/min (ref >90)
Potassium: 3.5 mEq/L (ref 3.5–5.1)
Sodium: 133 mEq/L — ABNORMAL LOW (ref 135–145)

## 2011-01-07 NOTE — Progress Notes (Signed)
Subjective: No major change. Awaiting brace.   Objective: Vital signs in last 24 hours: Temp:  [98 F (36.7 C)-98.9 F (37.2 C)] 98.9 F (37.2 C) (12/14 0619) Pulse Rate:  [61-78] 61  (12/14 0619) Resp:  [18-20] 18  (12/14 0619) BP: (116-154)/(66-77) 154/74 mmHg (12/14 0619) SpO2:  [96 %-97 %] 96 % (12/14 0619) Weight:  [49.896 kg (110 lb)] 110 lb (49.896 kg) (12/13 1717)  Intake/Output from previous day: 12/13 0701 - 12/14 0700 In: 2441.3 [P.O.:240; I.V.:2201.3] Out: 2200 [Urine:2200] Intake/Output this shift: Total I/O In: 2441.3 [P.O.:240; I.V.:2201.3] Out: 1500 [Urine:1500]   Basename 01/06/11 0500 01/05/11 1727  HGB 11.2* 12.1    Basename 01/06/11 0500 01/05/11 1727  WBC 6.3 12.9*  RBC 3.48* 3.72*  HCT 32.0* 34.1*  PLT 207 210    Basename 01/07/11 0329 01/06/11 0500  NA 133* 131*  K 3.5 3.4*  CL 104 101  CO2 22 23  BUN 8 6  CREATININE 0.50 0.47*  GLUCOSE 82 82  CALCIUM 9.3 9.0   No results found for this basename: LABPT:2,INR:2 in the last 72 hours  Neurologically intact  Assessment/Plan: Will ambulate with Brace.   Dana Bush A 01/07/2011, 6:57 AM

## 2011-01-07 NOTE — Clinical Documentation Improvement (Signed)
GENERIC DOCUMENTATION CLARIFICATION QUERY  THIS DOCUMENT IS NOT A PERMANENT PART OF THE MEDICAL RECORD  TO RESPOND TO THE THIS QUERY, FOLLOW THE INSTRUCTIONS BELOW:  1. If needed, update documentation for the patient's encounter via the notes activity.  2. Access this query again and click edit on the [employer].  3. After updating, or not, click F2 to complete all highlighted (required) fields concerning your review. Select "additional documentation in the medical record" OR "no additional documentation provided".  4. Click Sign note button.  5. The deficiency will fall out of your InBasket *Please let us know if you are not able to compete this workflow by phone or e-mail (listed below).  Please update your documentation within the medical record to reflect your response to this query.                                                                                        01/07/11   Dear Jannet Askew, [facility],  In a better effort to capture your patient's severity of illness, reflect appropriate length of stay and utilization of resources, a review of the patient medical record has revealed the following indicators.    Based on your clinical judgment, please clarify and document in a progress note and/or discharge summary the clinical condition associated with the following supporting information:  In responding to this query please exercise your independent judgment.  The fact that a query is asked, does not imply that any particular answer is desired or expected.  Based on the patient's current clinical presentation please clarify:  Pt admitted with compression fracture.  According to H/P/PN pt with Compression Fracture. Please clarify whether or not compression fracture can be further classified as one of the diagnoses listed below and document in pn and d/c summary. Thank You!    Possible Clinical Conditions?  Pathological compression fracture  Traumatic  compression fracture  _______Other Condition__________________ _______Cannot Clinically Determine   Supporting Information:  Risk Factors: History of Present Illness: Patient was putting up Christmas decorations, turned and fell Osteoporosis Worsening of previous compression fracture  Signs & Symptoms: Pain  Diagnostics:  DG Lumbar Spine 01/05/11 IMPRESSION: Moderate to severe compression fracture of L1, with progression since 10/08/2010.  Per CMS PQRS reporting requirements (PQRS Measure 24): Given the patient's age of greater than 50 and the fracture site (hip, distal radius, or spine), the patient should be tested for osteoporosis using DXA, and the appropriate treatment considered based on the DXA results.   MR Lumbar Spine 01/06/11 IMPRESSION: Compression fractures of T12, L1 and L4 as detailed above.  There is a small amount of epidural blood on the right extending from the T11-12 disc space to the right T12 pedicle level without significant associated mass effect.  Probable hemangioma L3 vertebral body.  Nonspecific 8 mm lesion within the left aspect of the L2 vertebral body.  Stability can be confirmed on follow-up.  Treatment HYDROcodone-acetaminophen (NORCO) 5-325  multivitamins ther. w/minerals  aspirin   You may use possible, probable, or suspect with inpatient documentation. possible, probable, suspected diagnoses MUST be documented at the time of discharge  Reviewed:  no additional  documentation provided  No new updates from MD 01/20/2011 ljh  Thank You,  Enis Slipper  RN, BSN, CCDS Clinical Documentation Specialist [facility] Dept Pager: [phone] / E-mail: [email or web address]  Health Information Management [place]

## 2011-01-07 NOTE — Progress Notes (Signed)
Physical Therapy Evaluation Patient Details Name: Dana Bush MRN: [id number] DOB: February 06, 1926 Today's Date: 01/07/2011 [id number] EV2  Discharge Recommendations: May need STSNF vs. HHPT with spouse providing 24 hour assist at d/c depending on progress and level of help spouse can provide.  Problem List:  Patient Active Problem List  Diagnoses  . Compression fracture of L1 lumbar vertebra  . Fall  . Hyponatremia  . Osteoporosis  . Fall from chair    Past Medical History:  Past Medical History  Diagnosis Date  . Peptic ulcer disease   . Osteoporosis   . Depression   . Anxiety   . Hyperlipemia   . IgG monoclonal gammopathy   . Compression fracture of L2 lumbar vertebra    Past Surgical History:  Past Surgical History  Procedure Date  . Cervical polyp removal   . Dilation and curettage of uterus   . Tonsillectomy     PT Assessment/Plan/Recommendation PT Assessment Clinical Impression Statement: Patient s/p fall with T12, L1, L4 compression fractures with acute pain limiting mobility will benefit from skilled PT in the acute setting to progress as tolerated.  May need STSNF for rehab depending on progress and level of help spouse can give at discharge. PT Recommendation/Assessment: Patient will need skilled PT in the acute care venue PT Problem List: Decreased activity tolerance;Pain;Decreased mobility;Decreased balance PT Therapy Diagnosis : Abnormality of gait;Acute pain PT Plan PT Frequency: Min 3X/week PT Treatment/Interventions: DME instruction;Gait training;Stair training;Functional mobility training;Therapeutic activities;Patient/family education;Balance training PT Recommendation Follow Up Recommendations: 24 hour supervision/assistance;Skilled nursing facility;Home health PT (depending on progress and level of help spouse can give) Equipment Recommended: Defer to next venue PT Goals  Acute Rehab PT Goals PT Goal Formulation: With patient Time For Goal  Achievement: 7 days Pt will Roll Supine to Left Side: with modified independence;with rail PT Goal: Rolling Supine to Left Side - Progress: Progressing toward goal Pt will go Supine/Side to Sit: with modified independence;with rail PT Goal: Supine/Side to Sit - Progress: Progressing toward goal Pt will go Sit to Supine/Side: with modified independence;with rail PT Goal: Sit to Supine/Side - Progress: Progressing toward goal Pt will go Sit to Stand: with supervision PT Goal: Sit to Stand - Progress: Progressing toward goal Pt will go Stand to Sit: with supervision PT Goal: Stand to Sit - Progress: Progressing toward goal Pt will Ambulate: 16 - 50 feet;with min assist;with rolling walker PT Goal: Ambulate - Progress: Progressing toward goal Pt will Go Up / Down Stairs: 3-5 stairs;with min assist;with rail(s) PT Goal: Up/Down Stairs - Progress: Other (comment) (not addressed on eval)  PT Evaluation Precautions/Restrictions  Precautions Precautions: Fall Precaution Comments: fell prior to this admission and a couple of months ago Required Braces or Orthoses: Yes Spinal Brace: Lumbar corset;Applied in sitting position Prior Functioning  Home Living Lives With: Spouse Receives Help From: Family Type of Home: House Home Layout: One level Home Access: Stairs to enter Entrance Stairs-Rails: Lawyer of Steps: 5 Bathroom Shower/Tub: Naval architect Equipment: Paediatric nurse with back;Straight cane;Walker - rolling Prior Function Level of Independence: Independent with basic ADLs;Requires assistive device for independence;Independent with gait;Independent with homemaking with ambulation;Independent with transfers Driving: Yes (but not for a couple of months) Cognition Cognition Arousal/Alertness: Awake/alert Overall Cognitive Status: Appears within functional limits for tasks assessed Orientation Level: Oriented  X4 Sensation/Coordination Sensation Light Touch: Appears Intact Extremity Assessment RLE Assessment RLE Assessment: Within Functional Limits LLE Assessment LLE Assessment: Within Functional Limits Mobility (including Balance) Bed  Mobility Bed Mobility: Yes Rolling Left: 4: Min assist;With rail Rolling Left Details (indicate cue type and reason): minguard assist for technique Left Sidelying to Sit: 4: Min assist;With rails;HOB elevated (comment degrees) Left Sidelying to Sit Details (indicate cue type and reason): cues for technique with UE assist HOB 20 degrees Sit to Supine - Left: 4: Min assist;HOB flat Sit to Supine - Left Details (indicate cue type and reason): for safe/controlled lowering of upper body Scooting to HOB: 1: +2 Total assist Scooting to [facility] Details (indicate cue type and reason): pulled patient up on mat Transfers Transfers: Yes Sit to Stand: 4: Min assist;From bed;With upper extremity assist Sit to Stand Details (indicate cue type and reason): UE assist on walker and min assist for safety Stand to Sit: 4: Min assist;To bed;Without upper extremity assist Stand to Sit Details: for controlled lowering  Ambulation/Gait Ambulation/Gait: Yes Ambulation/Gait Assistance: 4: Min assist Ambulation/Gait Assistance Details (indicate cue type and reason): for safety to side step up towards head of bed (further ambulation deferred secondary to severe pain) Ambulation Distance (Feet): 2 Feet Assistive device: Rolling walker    Exercise    End of Session PT - End of Session Equipment Utilized During Treatment: Gait belt;Back brace Activity Tolerance: Patient limited by pain Patient left: in bed;with call bell in reach Nurse Communication: Mobility status for transfers General Behavior During Session: [facility] for tasks performed Cognition: [facility] for tasks performed  [facility] 01/07/2011, 12:30 PM

## 2011-01-07 NOTE — Progress Notes (Signed)
Subjective: Patient's husband at the bedside and is concerned about his ability to care for the patient at home. The patient has issues with short-term memory loss and her husband reports that without 24/7 supervision to the wrist herself when she is able to ambulate well. Although he is able to provide supervision he is concerned about his ability to care for her her current convalescent state. The patient states that she has some pain but it appears to be much improved since yesterday. Patient was able to roll side to side with minimal assistance and very little complaints of pain. Objective: Filed Vitals:   01/06/11 1717 01/06/11 2336 01/07/11 0619 01/07/11 1353  BP: 149/77 135/77 154/74 122/68  Pulse: 66 66 61 69  Temp: 98.5 F (36.9 C) 98.5 F (36.9 C) 98.9 F (37.2 C) 99 F (37.2 C)  TempSrc: Oral Oral Oral Oral  Resp: 18 18 18 18   Height: 5\' 1"  (1.549 m)     Weight: 49.896 kg (110 lb)     SpO2: 96% 96% 96% 95%   Weight change:   Intake/Output Summary (Last 24 hours) at 01/07/11 1804 Last data filed at 01/07/11 1500  Gross per 24 hour  Intake 3527.25 ml  Output   3100 ml  Net 427.25 ml    General: Alert, awake, oriented x3, in no acute distress.  HEENT: [place]/AT PEERL, EOMI Neck: Trachea midline,  no masses, no thyromegal,y no JVD, no carotid bruit OROPHARYNX:  Moist, No exudate/ erythema/lesions.  Heart: Regular rate and rhythm, without murmurs, rubs, gallops, PMI non-displaced, no heaves or thrills on palpation.  Lungs: Clear to auscultation, no wheezing or rhonchi noted. No increased vocal fremitus resonant to percussion  Abdomen: Soft, nontender, nondistended, positive bowel sounds, no masses no hepatosplenomegaly noted..  Neuro: No focal neurological deficits noted cranial nerves II through XII grossly intact. DTRs 2+ bilaterally upper and lower extremities. Strength 5 out of 5 in bilateral upper and lower extremities. Musculoskeletal: No warm swelling or erythema around  joints, no spinal tenderness noted. Psychiatric: Patient alert and oriented x3, good insight and cognition, good recent to remote recall. Lymph node survey: No cervical axillary or inguinal lymphadenopathy noted.     Lab Results:  Basename 01/07/11 0329 01/06/11 0500  NA 133* 131*  K 3.5 3.4*  CL 104 101  CO2 22 23  GLUCOSE 82 82  BUN 8 6  CREATININE 0.50 0.47*  CALCIUM 9.3 9.0  MG -- --  PHOS -- --   No results found for this basename: AST:2,ALT:2,ALKPHOS:2,BILITOT:2,PROT:2,ALBUMIN:2 in the last 72 hours No results found for this basename: LIPASE:2,AMYLASE:2 in the last 72 hours  Basename 01/06/11 0500 01/05/11 1727  WBC 6.3 12.9*  NEUTROABS -- 10.8*  HGB 11.2* 12.1  HCT 32.0* 34.1*  MCV 92.0 91.7  PLT 207 210   No results found for this basename: CKTOTAL:3,CKMB:3,CKMBINDEX:3,TROPONINI:3 in the last 72 hours No components found with this basename: POCBNP:3 No results found for this basename: DDIMER:2 in the last 72 hours No results found for this basename: HGBA1C:2 in the last 72 hours No results found for this basename: CHOL:2,HDL:2,LDLCALC:2,TRIG:2,CHOLHDL:2,LDLDIRECT:2 in the last 72 hours No results found for this basename: TSH,T4TOTAL,FREET3,T3FREE,THYROIDAB in the last 72 hours No results found for this basename: VITAMINB12:2,FOLATE:2,FERRITIN:2,TIBC:2,IRON:2,RETICCTPCT:2 in the last 72 hours  Micro Results: Recent Results (from the past 240 hour(s))  URINE CULTURE     Status: Normal   Collection Time   01/05/11  7:41 PM      Component Value Range Status  Comment   Specimen Description URINE, CLEAN CATCH   Final    Special Requests NONE   Final    Setup Time [id number]   Final    Colony Count >=100,000 COLONIES/ML   Final    Culture     Final    Value: DIPHTHEROIDS(CORYNEBACTERIUM SPECIES)     Note: Standardized susceptibility testing for this organism is not available.   Report Status 01/07/2011 FINAL   Final     Studies/Results: Dg Chest 2  View  01/05/2011  *RADIOLOGY REPORT*  Clinical Data: Fall  CHEST - 2 VIEW  Comparison: None.  Findings: Prominent lung markings suggestive of chronic lung disease.  Negative for infiltrate or effusion.  Negative for heart failure.  Heart size is mildly enlarged.  Compression fracture of L1.  See lumbar spine report.  IMPRESSION: No active cardiopulmonary disease.  Original Report Authenticated By: Camelia Phenes, M.D.   Dg Ribs Unilateral Right  01/05/2011  *RADIOLOGY REPORT*  Clinical Data: Fall.  Pain  RIGHT RIBS - 2 VIEW  Comparison: None.  Findings: Negative for rib fracture on the right.  No focal bony lesion.  IMPRESSION: Negative  Original Report Authenticated By: Camelia Phenes, M.D.   Dg Lumbar Spine Complete  01/05/2011  *RADIOLOGY REPORT*  Clinical Data: Fall.  Back pain  LUMBAR SPINE - COMPLETE 4+ VIEW  Comparison: 10/08/2010  Findings: Moderate to severe fracture of L1 has progressed in severity since the prior study.  This may be related to the recent fall.  No other fractures.  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  Grade 1 slip L4 on L5 with mild disc degeneration at L3-4 and L4-5. Atherosclerotic disease in the aorta.  IMPRESSION: Moderate to severe compression fracture of L1, with progression since 10/08/2010.  Per CMS PQRS reporting requirements (PQRS Measure 24): Given the patient's age of greater than 50 and the fracture site (hip, distal radius, or spine), the patient should be tested for osteoporosis using DXA, and the appropriate treatment considered based on the DXA results.  Original Report Authenticated By: Camelia Phenes, M.D.   Dg Pelvis 1-2 Views  01/05/2011  *RADIOLOGY REPORT*  Clinical Data: Fall.  Pain  PELVIS - 1-2 VIEW  Comparison: 10/18/2010  Findings: Both hips are in normal alignment.  No significant hip degeneration.  Negative for pelvic or hip fracture. Atherosclerotic disease in the iliac arteries.   IMPRESSION: Negative for fracture.  Original Report Authenticated By: Camelia Phenes, M.D.   Ct Head Wo Contrast  01/05/2011  *RADIOLOGY REPORT*  Clinical Data:  Fall.  Head and neck pain.  CT HEAD WITHOUT CONTRAST CT CERVICAL SPINE WITHOUT CONTRAST  Technique:  Multidetector CT imaging of the head and cervical spine was performed following the standard protocol without intravenous contrast.  Multiplanar CT image reconstructions of the cervical spine were also generated.  Comparison:  10/08/2010 and previous  CT HEAD  Findings: The brain shows generalized atrophy with extensive chronic small vessel disease throughout the white matter.  No sign of acute infarction, mass lesion, hemorrhage, hydrocephalus or extra-axial collection.  No skull fracture.  Sinuses, middle ears and mastoids are clear.  IMPRESSION: Atrophy and chronic small vessel disease.  No acute or traumatic finding.  No change since previous study.  CT CERVICAL SPINE  Findings: No evidence of fracture or traumatic malalignment.  No significant degenerative change either of the discs or facets.  No focal lesion.  IMPRESSION:  Remarkably normal scan for person of this age.  No acute or traumatic finding.  Only minimal degenerative changes.  Original Report Authenticated By: Thomasenia Sales, M.D.   Ct Cervical Spine Wo Contrast  01/05/2011  *RADIOLOGY REPORT*  Clinical Data:  Fall.  Head and neck pain.  CT HEAD WITHOUT CONTRAST CT CERVICAL SPINE WITHOUT CONTRAST  Technique:  Multidetector CT imaging of the head and cervical spine was performed following the standard protocol without intravenous contrast.  Multiplanar CT image reconstructions of the cervical spine were also generated.  Comparison:  10/08/2010 and previous  CT HEAD  Findings: The brain shows generalized atrophy with extensive chronic small vessel disease throughout the white matter.  No sign of acute infarction, mass lesion, hemorrhage, hydrocephalus or extra-axial collection.  No  skull fracture.  Sinuses, middle ears and mastoids are clear.  IMPRESSION: Atrophy and chronic small vessel disease.  No acute or traumatic finding.  No change since previous study.  CT CERVICAL SPINE  Findings: No evidence of fracture or traumatic malalignment.  No significant degenerative change either of the discs or facets.  No focal lesion.  IMPRESSION: Remarkably normal scan for person of this age.  No acute or traumatic finding.  Only minimal degenerative changes.  Original Report Authenticated By: Thomasenia Sales, M.D.   Mr Lumbar Spine W Wo Contrast  01/06/2011  *RADIOLOGY REPORT*  Clinical Data: Evaluate compression fractures.  MRI LUMBAR SPINE WITHOUT AND WITH CONTRAST  Technique:  Multiplanar and multiecho pulse sequences of the lumbar spine were obtained without and with intravenous contrast.  Contrast: 10mL MULTIHANCE GADOBENATE DIMEGLUMINE 529 MG/ML IV SOLN  Comparison: 01/05/2011 plain film exam. 10/19/2008 CT.  No comparison MR.  Findings: Last fully open disc space is labeled L5-S1.  Present examination incorporates from upper T11 through the lower sacral region.  Conus L1-2 level.  Visualized paravertebral structures demonstrate adrenal hyperplasia otherwise unremarkable.  Heterogeneous bone marrow probably related to osteoporosis. Abnormal signal throughout the L3 vertebra greater to left midline. Appearance is most suggestive of hemangioma (when combined with CT appearance).  Nonspecific rounded 8 mm lesion within the left aspect of the L2 vertebral body does not demonstrate characteristics of a hemangioma. Malignancy not excluded.  Stability of this can be confirmed on follow-up  Anterior wedge compression fracture of the L1 vertebral body with 90% loss of height centrally, 80% loss of height anteriorly and with retropulsion of the posterior-superior aspect of the compressed vertebra contributing to mild spinal stenosis greater on the left.  This approaches but does not compress the conus.   Mild paravertebral soft tissue prominence may represent hemorrhage.  No epidural extension.  T12 superior endplate compression fracture with 15 - 20% loss of height.  This extends into the right pedicle.  Minimal retropulsion of the right posterior lateral aspect of the compressed superior endplate without significant spinal stenosis.  Tiny amount of epidural blood extends from the right T12 pedicle level superiorly to the T11-T12 disc space level.  Subtle L4 superior endplate compression fracture greater to the right of midline.  Per CMS PQRI reporting requirements (PQRI Measure 24): Given the patient's age of greater than 50 and the fracture site (hip, distal radius, or spine), the patient should be tested for osteoporosis using DXA, and the appropriate treatment considered based on the DXA results.  T11-12:  Mild facet joint degenerative changes.  T12-L1:  Mild spinal stenosis greater on the left as discussed above.  L1-2:  Minimal facet joint degenerative changes.  Minimal  bulge.  L2-3:  Mild facet joint degenerative changes.  Mild bulge.  Mild ligamentum flavum hypertrophy.  Very mild spinal stenosis with a trefoil appearance.  L3-4:  Moderate bilateral facet joint degenerative changes.  3.8 mm anterior slip of L3.  Bulge/broad-based protrusion slightly greater right lateral position with mild encroachment upon the exiting right L3 nerve root which does not appear significantly compressed. Ligamentum flavum hypertrophy.  Multifactorial mild to slightly moderate spinal stenosis and lateral recess stenosis.  L4-5:  Moderate facet joint degenerative changes.  Ligamentum flavum hypertrophy greater on the left with slight impression upon the left lateral aspect of the thecal sac.  2.4 mm anterior slip of L4.  Bulge.  Mild to slightly moderate superior lateral recess stenosis.  Mild spinal stenosis.  L5-S1:  Facet joint degenerative changes.  Mild bulge.  No significant spinal stenosis or foraminal narrowing.   IMPRESSION: Compression fractures of T12, L1 and L4 as detailed above.  There is a small amount of epidural blood on the right extending from the T11-12 disc space to the right T12 pedicle level without significant associated mass effect.  Probable hemangioma L3 vertebral body.  Nonspecific 8 mm lesion within the left aspect of the L2 vertebral body.  Stability can be confirmed on follow-up.  Scattered degenerative changes as detailed above with multifactorial spinal stenosis and lateral recess stenosis most prominent at the L3-4 level.  Original Report Authenticated By: Fuller Canada, M.D.    Medications: I have reviewed the patient's current medications. Scheduled Meds:   . aspirin EC  81 mg Oral Daily  . cholecalciferol  1,000 Units Oral Daily  . famotidine  20 mg Oral BID  . mirtazapine  15 mg Oral QHS  . multivitamins ther. w/minerals  1 tablet Oral Daily  . oxyCODONE  5 mg Oral TID WC  . senna  1 tablet Oral BID   Continuous Infusions:   . 0.9 % NaCl with KCl 20 mEq / L 75 mL/hr at 01/07/11 1610   PRN Meds:.alum & mag hydroxide-simeth, bisacodyl, HYDROcodone-acetaminophen, ondansetron Assessment/Plan: Patient Active Hospital Problem List: Compression fracture of L1 lumbar vertebra (01/05/2011)   Assessment: Patient's pain seems to be markedly decreased. MRI showed hematoma however the patient does not have any neurological manifestations as a result of this. Appreciate input by orthospine service. Rate has been provided for the patient in physical therapy to work with the patient increase mobility. Is likely this patient likely needs skilled facility for short-term rehabilitation prior to returning home. Her last social work to start that process. In the meantime we'll continue increasing mobility on this patient with the use of the brace and pain medications as necessary.   Fall (01/05/2011)   Assessment: Mechanical fall without any syncope. Physical therapy worked with patient  increase functional mobility.   Hyponatremia (01/05/2011)   Assessment: Improved with hydration. Will continue the oral hydration and monitor.     Osteoporosis (01/05/2011)   Assessment: Will defer to outpatient physician but will recommend bisphosphonate at time of discharge.      discharge plan will speak to social work about starting the process of post discharge placement a skilled nursing facility. Not yet medically stable for discharge  LOS: 2 days

## 2011-01-08 LAB — MAGNESIUM: Magnesium: 1.7 mg/dL (ref 1.5–2.5)

## 2011-01-08 LAB — BASIC METABOLIC PANEL
CO2: 25 mEq/L (ref 19–32)
Chloride: 101 mEq/L (ref 96–112)
Glucose, Bld: 93 mg/dL (ref 70–99)
Potassium: 3.4 mEq/L — ABNORMAL LOW (ref 3.5–5.1)
Sodium: 133 mEq/L — ABNORMAL LOW (ref 135–145)

## 2011-01-08 LAB — CBC
MCH: 33 pg (ref 26.0–34.0)
MCHC: 36.2 g/dL — ABNORMAL HIGH (ref 30.0–36.0)
Platelets: 204 10*3/uL (ref 150–400)
RBC: 3.73 MIL/uL — ABNORMAL LOW (ref 3.87–5.11)

## 2011-01-08 LAB — DIFFERENTIAL
Basophils Relative: 0 % (ref 0–1)
Eosinophils Absolute: 0.1 10*3/uL (ref 0.0–0.7)
Lymphs Abs: 1.7 10*3/uL (ref 0.7–4.0)
Neutro Abs: 3.8 10*3/uL (ref 1.7–7.7)
Neutrophils Relative %: 60 % (ref 43–77)

## 2011-01-08 MED ORDER — POTASSIUM CHLORIDE CRYS ER 20 MEQ PO TBCR
40.0000 meq | EXTENDED_RELEASE_TABLET | Freq: Two times a day (BID) | ORAL | Status: DC
  Administered 2011-01-08 – 2011-01-11 (×7): 40 meq via ORAL
  Filled 2011-01-08 (×8): qty 2

## 2011-01-08 MED ORDER — HYDRALAZINE HCL 20 MG/ML IJ SOLN
5.0000 mg | Freq: Four times a day (QID) | INTRAMUSCULAR | Status: DC | PRN
  Administered 2011-01-08: 5 mg via INTRAVENOUS
  Filled 2011-01-08: qty 1

## 2011-01-08 NOTE — Progress Notes (Signed)
Subjective: Patient states that her pain is improved today. 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 Physical therapy is also recommended short terms skilled nursing facility. I placed a consult with social work to pursue this post discharge but Objective: Filed Vitals:   01/07/11 1353 01/07/11 2050 01/08/11 0622 01/08/11 1409  BP: 122/68 146/74 145/66 155/80  Pulse: 69 61 68 70  Temp: 99 F (37.2 C) 98.1 F (36.7 C) 97.5 F (36.4 C) 97.7 F (36.5 C)  TempSrc: Oral Oral Oral Oral  Resp: 18 16 16 18   Height:      Weight:      SpO2: 95% 96% 96% 97%   Weight change:   Intake/Output Summary (Last 24 hours) at 01/08/11 1734 Last data filed at 01/08/11 1300  Gross per 24 hour  Intake    500 ml  Output   1350 ml  Net   -850 ml    General: Alert, awake, oriented x3, in no acute distress.  HEENT: [place]/AT PEERL, EOMI Neck: Trachea midline,  no masses, no thyromegal,y no JVD, no carotid bruit OROPHARYNX:  Moist, No exudate/ erythema/lesions.  Heart: Regular rate and rhythm, without murmurs, rubs, gallops, PMI non-displaced, no heaves or thrills on palpation.  Lungs: Clear to auscultation, no wheezing or rhonchi noted. No increased vocal fremitus resonant to percussion  Abdomen: Soft, nontender, nondistended, positive bowel sounds, no masses no hepatosplenomegaly noted..  Neuro: No focal neurological deficits noted cranial nerves II through XII grossly intact. DTRs 2+ bilaterally upper and lower extremities. Strength 3+/5 in bilateral upper and lower extremities. Musculoskeletal: No warm swelling or erythema around joints, no spinal tenderness noted. Psychiatric: Patient alert and oriented x3, good insight and cognition, good recent to remote recall. Lymph node survey: No cervical  axillary or inguinal lymphadenopathy noted.     Lab Results:  [facility] 01/08/11 0320 01/07/11 0329  NA 133* 133*  K 3.4* 3.5  CL 101 104  CO2 25 22  GLUCOSE 93 82  BUN 12 8  CREATININE 0.54 0.50  CALCIUM 9.8 9.3  MG 1.7 --  PHOS -- --   No results found for this basename: AST:2,ALT:2,ALKPHOS:2,BILITOT:2,PROT:2,ALBUMIN:2 in the last 72 hours No results found for this basename: LIPASE:2,AMYLASE:2 in the last 72 hours  Basename 01/08/11 0320 01/06/11 0500  WBC 6.3 6.3  NEUTROABS 3.8 --  HGB 12.3 11.2*  HCT 34.0* 32.0*  MCV 91.2 92.0  PLT 204 207   No results found for this basename: CKTOTAL:3,CKMB:3,CKMBINDEX:3,TROPONINI:3 in the last 72 hours No components found with this basename: POCBNP:3 No results found for this basename: DDIMER:2 in the last 72 hours No results found for this basename: HGBA1C:2 in the last 72 hours No results found for this basename: CHOL:2,HDL:2,LDLCALC:2,TRIG:2,CHOLHDL:2,LDLDIRECT:2 in the last 72 hours No results found for this basename: TSH,T4TOTAL,FREET3,T3FREE,THYROIDAB in the last 72 hours No results found for this basename: VITAMINB12:2,FOLATE:2,FERRITIN:2,TIBC:2,IRON:2,RETICCTPCT:2 in the last 72 hours  Micro Results: Recent Results (from the past 240 hour(s))  URINE CULTURE     Status: Normal   Collection Time   01/05/11  7:41 PM      Component Value Range Status Comment   Specimen Description URINE, CLEAN CATCH   Final    Special Requests NONE   Final    Setup Time [id number]   Final  Colony Count >=100,000 COLONIES/ML   Final    Culture     Final    Value: DIPHTHEROIDS(CORYNEBACTERIUM SPECIES)     Note: Standardized susceptibility testing for this organism is not available.   Report Status 01/07/2011 FINAL   Final     Studies/Results: Dg Chest 2 View  01/05/2011  *RADIOLOGY REPORT*  Clinical Data: Fall  CHEST - 2 VIEW  Comparison: None.  Findings: Prominent lung markings suggestive of chronic lung disease.  Negative for  infiltrate or effusion.  Negative for heart failure.  Heart size is mildly enlarged.  Compression fracture of L1.  See lumbar spine report.  IMPRESSION: No active cardiopulmonary disease.  Original Report Authenticated By: Camelia Phenes, M.D.   Dg Ribs Unilateral Right  01/05/2011  *RADIOLOGY REPORT*  Clinical Data: Fall.  Pain  RIGHT RIBS - 2 VIEW  Comparison: None.  Findings: Negative for rib fracture on the right.  No focal bony lesion.  IMPRESSION: Negative  Original Report Authenticated By: Camelia Phenes, M.D.   Dg Lumbar Spine Complete  01/05/2011  *RADIOLOGY REPORT*  Clinical Data: Fall.  Back pain  LUMBAR SPINE - COMPLETE 4+ VIEW  Comparison: 10/08/2010  Findings: Moderate to severe fracture of L1 has progressed in severity since the prior study.  This may be related to the recent fall.  No other fractures.  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  Grade 1 slip L4 on L5 with mild disc degeneration at L3-4 and L4-5. Atherosclerotic disease in the aorta.  IMPRESSION: Moderate to severe compression fracture of L1, with progression since 10/08/2010.  Per CMS PQRS reporting requirements (PQRS Measure 24): Given the patient's age of greater than 50 and the fracture site (hip, distal radius, or spine), the patient should be tested for osteoporosis using DXA, and the appropriate treatment considered based on the DXA results.  Original Report Authenticated By: Camelia Phenes, M.D.   Dg Pelvis 1-2 Views  01/05/2011  *RADIOLOGY REPORT*  Clinical Data: Fall.  Pain  PELVIS - 1-2 VIEW  Comparison: 10/18/2010  Findings: Both hips are in normal alignment.  No significant hip degeneration.  Negative for pelvic or hip fracture. Atherosclerotic disease in the iliac arteries.  IMPRESSION: Negative for fracture.  Original Report Authenticated By: Camelia Phenes, M.D.   Ct Head Wo Contrast  01/05/2011  *RADIOLOGY REPORT*  Clinical Data:  Fall.  Head and neck  pain.  CT HEAD WITHOUT CONTRAST CT CERVICAL SPINE WITHOUT CONTRAST  Technique:  Multidetector CT imaging of the head and cervical spine was performed following the standard protocol without intravenous contrast.  Multiplanar CT image reconstructions of the cervical spine were also generated.  Comparison:  10/08/2010 and previous  CT HEAD  Findings: The brain shows generalized atrophy with extensive chronic small vessel disease throughout the white matter.  No sign of acute infarction, mass lesion, hemorrhage, hydrocephalus or extra-axial collection.  No skull fracture.  Sinuses, middle ears and mastoids are clear.  IMPRESSION: Atrophy and chronic small vessel disease.  No acute or traumatic finding.  No change since previous study.  CT CERVICAL SPINE  Findings: No evidence of fracture or traumatic malalignment.  No significant degenerative change either of the discs or facets.  No focal lesion.  IMPRESSION: Remarkably normal scan for person of this age.  No acute or traumatic finding.  Only minimal degenerative changes.  Original Report Authenticated By: Thomasenia Sales, M.D.   Ct Cervical  Spine Wo Contrast  01/05/2011  *RADIOLOGY REPORT*  Clinical Data:  Fall.  Head and neck pain.  CT HEAD WITHOUT CONTRAST CT CERVICAL SPINE WITHOUT CONTRAST  Technique:  Multidetector CT imaging of the head and cervical spine was performed following the standard protocol without intravenous contrast.  Multiplanar CT image reconstructions of the cervical spine were also generated.  Comparison:  10/08/2010 and previous  CT HEAD  Findings: The brain shows generalized atrophy with extensive chronic small vessel disease throughout the white matter.  No sign of acute infarction, mass lesion, hemorrhage, hydrocephalus or extra-axial collection.  No skull fracture.  Sinuses, middle ears and mastoids are clear.  IMPRESSION: Atrophy and chronic small vessel disease.  No acute or traumatic finding.  No change since previous study.  CT  CERVICAL SPINE  Findings: No evidence of fracture or traumatic malalignment.  No significant degenerative change either of the discs or facets.  No focal lesion.  IMPRESSION: Remarkably normal scan for person of this age.  No acute or traumatic finding.  Only minimal degenerative changes.  Original Report Authenticated By: Thomasenia Sales, M.D.   Mr Lumbar Spine W Wo Contrast  01/06/2011  *RADIOLOGY REPORT*  Clinical Data: Evaluate compression fractures.  MRI LUMBAR SPINE WITHOUT AND WITH CONTRAST  Technique:  Multiplanar and multiecho pulse sequences of the lumbar spine were obtained without and with intravenous contrast.  Contrast: 10mL MULTIHANCE GADOBENATE DIMEGLUMINE 529 MG/ML IV SOLN  Comparison: 01/05/2011 plain film exam. 10/19/2008 CT.  No comparison MR.  Findings: Last fully open disc space is labeled L5-S1.  Present examination incorporates from upper T11 through the lower sacral region.  Conus L1-2 level.  Visualized paravertebral structures demonstrate adrenal hyperplasia otherwise unremarkable.  Heterogeneous bone marrow probably related to osteoporosis. Abnormal signal throughout the L3 vertebra greater to left midline. Appearance is most suggestive of hemangioma (when combined with CT appearance).  Nonspecific rounded 8 mm lesion within the left aspect of the L2 vertebral body does not demonstrate characteristics of a hemangioma. Malignancy not excluded.  Stability of this can be confirmed on follow-up  Anterior wedge compression fracture of the L1 vertebral body with 90% loss of height centrally, 80% loss of height anteriorly and with retropulsion of the posterior-superior aspect of the compressed vertebra contributing to mild spinal stenosis greater on the left.  This approaches but does not compress the conus.  Mild paravertebral soft tissue prominence may represent hemorrhage.  No epidural extension.  T12 superior endplate compression fracture with 15 - 20% loss of height.  This extends into  the right pedicle.  Minimal retropulsion of the right posterior lateral aspect of the compressed superior endplate without significant spinal stenosis.  Tiny amount of epidural blood extends from the right T12 pedicle level superiorly to the T11-T12 disc space level.  Subtle L4 superior endplate compression fracture greater to the right of midline.  Per CMS PQRI reporting requirements (PQRI Measure 24): Given the patient's age of greater than 50 and the fracture site (hip, distal radius, or spine), the patient should be tested for osteoporosis using DXA, and the appropriate treatment considered based on the DXA results.  T11-12:  Mild facet joint degenerative changes.  T12-L1:  Mild spinal stenosis greater on the left as discussed above.  L1-2:  Minimal facet joint degenerative changes.  Minimal bulge.  L2-3:  Mild facet joint degenerative changes.  Mild bulge.  Mild ligamentum flavum hypertrophy.  Very mild spinal stenosis with a trefoil appearance.  L3-4:  Moderate bilateral facet  joint degenerative changes.  3.8 mm anterior slip of L3.  Bulge/broad-based protrusion slightly greater right lateral position with mild encroachment upon the exiting right L3 nerve root which does not appear significantly compressed. Ligamentum flavum hypertrophy.  Multifactorial mild to slightly moderate spinal stenosis and lateral recess stenosis.  L4-5:  Moderate facet joint degenerative changes.  Ligamentum flavum hypertrophy greater on the left with slight impression upon the left lateral aspect of the thecal sac.  2.4 mm anterior slip of L4.  Bulge.  Mild to slightly moderate superior lateral recess stenosis.  Mild spinal stenosis.  L5-S1:  Facet joint degenerative changes.  Mild bulge.  No significant spinal stenosis or foraminal narrowing.  IMPRESSION: Compression fractures of T12, L1 and L4 as detailed above.  There is a small amount of epidural blood on the right extending from the T11-12 disc space to the right T12 pedicle  level without significant associated mass effect.  Probable hemangioma L3 vertebral body.  Nonspecific 8 mm lesion within the left aspect of the L2 vertebral body.  Stability can be confirmed on follow-up.  Scattered degenerative changes as detailed above with multifactorial spinal stenosis and lateral recess stenosis most prominent at the L3-4 level.  Original Report Authenticated By: Fuller Canada, M.D.    Medications: I have reviewed the patient's current medications. Scheduled Meds:    . aspirin EC  81 mg Oral Daily  . cholecalciferol  1,000 Units Oral Daily  . famotidine  20 mg Oral BID  . mirtazapine  15 mg Oral QHS  . multivitamins ther. w/minerals  1 tablet Oral Daily  . oxyCODONE  5 mg Oral TID WC  . potassium chloride  40 mEq Oral BID  . senna  1 tablet Oral BID   Continuous Infusions:    . 0.9 % NaCl with KCl 20 mEq / L 75 mL/hr at 01/07/11 1845   PRN Meds:.alum & mag hydroxide-simeth, bisacodyl, HYDROcodone-acetaminophen, ondansetron Assessment/Plan: Patient Active Hospital Problem List: Compression fracture of L1 lumbar vertebra (01/05/2011)   Assessment: Patient's pain seems to be markedly decreased and is doing well in terms of mobility using her brace post discharge this patient is for skilled nursing facility and social worker started that process. The patient will remain hospitalized until she's placed in a skilled nursing facility as returning to her home at this time will  put the patient on safe environment.   Fall (01/05/2011)   Assessment: Mechanical fall without any syncope. Physical therapy worked with patient increase functional mobility.   Hyponatremia (01/05/2011)   Assessment: Improved with hydration. Will continue to monitor.     Osteoporosis (01/05/2011)   Assessment: Will defer to outpatient physician but will recommend bisphosphonate at time of discharge.      discharge plan will speak to social work about starting the process of post discharge  placement a skilled nursing facility. Not yet medically stable for discharge  LOS: 3 days

## 2011-01-08 NOTE — Progress Notes (Signed)
Pt refuses IVF and requests PIV be removed. Explained importance of IV access to pt and is willing to leave IV in but does not want fluids running. Dana Bush Rosalie 01/08/2011 0100

## 2011-01-09 DIAGNOSIS — E441 Mild protein-calorie malnutrition: Secondary | ICD-10-CM | POA: Diagnosis present

## 2011-01-09 DIAGNOSIS — R42 Dizziness and giddiness: Secondary | ICD-10-CM | POA: Diagnosis not present

## 2011-01-09 LAB — BASIC METABOLIC PANEL
Calcium: 10.1 mg/dL (ref 8.4–10.5)
GFR calc Af Amer: >90 mL/min (ref >90)
GFR calc non Af Amer: >90 mL/min (ref >90)
Sodium: 132 mEq/L — ABNORMAL LOW (ref 135–145)

## 2011-01-09 LAB — PREALBUMIN: Prealbumin: 14 mg/dL — ABNORMAL LOW (ref 17.0–34.0)

## 2011-01-09 MED ORDER — BOOST PLUS PO LIQD
237.0000 mL | Freq: Three times a day (TID) | ORAL | Status: DC
  Administered 2011-01-09 – 2011-01-10 (×2): 237 mL via ORAL
  Filled 2011-01-09 (×6): qty 237

## 2011-01-09 NOTE — Progress Notes (Signed)
Subjective: Patient has no real concerns concerning her back. However she does have some complaints of dizziness when she stands initially. Her oral intake is poor and she states that usually she eats maybe one meal every 48 hours. Objective: Filed Vitals:   01/08/11 0622 01/08/11 1409 01/08/11 2120 01/09/11 0550  BP: 145/66 155/80 179/85 145/77  Pulse: 68 70 60 72  Temp: 97.5 F (36.4 C) 97.7 F (36.5 C) 97.8 F (36.6 C) 99.1 F (37.3 C)  TempSrc: Oral Oral Oral Oral  Resp: 16 18 18 16   Height:      Weight:      SpO2: 96% 97% 96% 96%   Weight change:   Intake/Output Summary (Last 24 hours) at 01/09/11 1323 Last data filed at 01/09/11 0550  Gross per 24 hour  Intake    350 ml  Output    200 ml  Net    150 ml    General: Alert, awake, oriented x3, in no acute distress.  HEENT: [place]/AT PEERL, EOMI Neck: Trachea midline,  no masses, no thyromegal,y no JVD, no carotid bruit OROPHARYNX:  Moist, No exudate/ erythema/lesions.  Heart: Regular rate and rhythm, without murmurs, rubs, gallops, PMI non-displaced, no heaves or thrills on palpation.  Lungs: Clear to auscultation, no wheezing or rhonchi noted. No increased vocal fremitus resonant to percussion  Abdomen: Soft, nontender, nondistended, positive bowel sounds, no masses no hepatosplenomegaly noted..  Neuro: No focal neurological deficits noted cranial nerves II through XII grossly intact. DTRs 2+ bilaterally upper and lower extremities. Strength 3+/5 in bilateral upper and lower extremities. Musculoskeletal: No warm swelling or erythema around joints, no spinal tenderness noted. Psychiatric: Patient alert and oriented x3, good insight and cognition, good recent to remote recall. Lymph node survey: No cervical axillary or inguinal lymphadenopathy noted.     Lab Results:  [facility] 01/09/11 0401 01/08/11 0320  NA 132* 133*  K 4.0 3.4*  CL 100 101  CO2 24 25  GLUCOSE 101* 93  BUN 8 12  CREATININE 0.44* 0.54  CALCIUM 10.1  9.8  MG -- 1.7  PHOS -- --   No results found for this basename: AST:2,ALT:2,ALKPHOS:2,BILITOT:2,PROT:2,ALBUMIN:2 in the last 72 hours No results found for this basename: LIPASE:2,AMYLASE:2 in the last 72 hours  Basename 01/08/11 0320  WBC 6.3  NEUTROABS 3.8  HGB 12.3  HCT 34.0*  MCV 91.2  PLT 204   No results found for this basename: CKTOTAL:3,CKMB:3,CKMBINDEX:3,TROPONINI:3 in the last 72 hours No components found with this basename: POCBNP:3 No results found for this basename: DDIMER:2 in the last 72 hours No results found for this basename: HGBA1C:2 in the last 72 hours No results found for this basename: CHOL:2,HDL:2,LDLCALC:2,TRIG:2,CHOLHDL:2,LDLDIRECT:2 in the last 72 hours No results found for this basename: TSH,T4TOTAL,FREET3,T3FREE,THYROIDAB in the last 72 hours No results found for this basename: VITAMINB12:2,FOLATE:2,FERRITIN:2,TIBC:2,IRON:2,RETICCTPCT:2 in the last 72 hours  Micro Results: Recent Results (from the past 240 hour(s))  URINE CULTURE     Status: Normal   Collection Time   01/05/11  7:41 PM      Component Value Range Status Comment   Specimen Description URINE, CLEAN CATCH   Final    Special Requests NONE   Final    Setup Time [id number]   Final    Colony Count >=100,000 COLONIES/ML   Final    Culture     Final    Value: DIPHTHEROIDS(CORYNEBACTERIUM SPECIES)     Note: Standardized susceptibility testing for this organism is not available.   Report Status  01/07/2011 FINAL   Final     Studies/Results: Dg Chest 2 View  01/05/2011  *RADIOLOGY REPORT*  Clinical Data: Fall  CHEST - 2 VIEW  Comparison: None.  Findings: Prominent lung markings suggestive of chronic lung disease.  Negative for infiltrate or effusion.  Negative for heart failure.  Heart size is mildly enlarged.  Compression fracture of L1.  See lumbar spine report.  IMPRESSION: No active cardiopulmonary disease.  Original Report Authenticated By: Camelia Phenes, M.D.   Dg Ribs Unilateral  Right  01/05/2011  *RADIOLOGY REPORT*  Clinical Data: Fall.  Pain  RIGHT RIBS - 2 VIEW  Comparison: None.  Findings: Negative for rib fracture on the right.  No focal bony lesion.  IMPRESSION: Negative  Original Report Authenticated By: Camelia Phenes, M.D.   Dg Lumbar Spine Complete  01/05/2011  *RADIOLOGY REPORT*  Clinical Data: Fall.  Back pain  LUMBAR SPINE - COMPLETE 4+ VIEW  Comparison: 10/08/2010  Findings: Moderate to severe fracture of L1 has progressed in severity since the prior study.  This may be related to the recent fall.  No other fractures.  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  Grade 1 slip L4 on L5 with mild disc degeneration at L3-4 and L4-5. Atherosclerotic disease in the aorta.  IMPRESSION: Moderate to severe compression fracture of L1, with progression since 10/08/2010.  Per CMS PQRS reporting requirements (PQRS Measure 24): Given the patient's age of greater than 50 and the fracture site (hip, distal radius, or spine), the patient should be tested for osteoporosis using DXA, and the appropriate treatment considered based on the DXA results.  Original Report Authenticated By: Camelia Phenes, M.D.   Dg Pelvis 1-2 Views  01/05/2011  *RADIOLOGY REPORT*  Clinical Data: Fall.  Pain  PELVIS - 1-2 VIEW  Comparison: 10/18/2010  Findings: Both hips are in normal alignment.  No significant hip degeneration.  Negative for pelvic or hip fracture. Atherosclerotic disease in the iliac arteries.  IMPRESSION: Negative for fracture.  Original Report Authenticated By: Camelia Phenes, M.D.   Ct Head Wo Contrast  01/05/2011  *RADIOLOGY REPORT*  Clinical Data:  Fall.  Head and neck pain.  CT HEAD WITHOUT CONTRAST CT CERVICAL SPINE WITHOUT CONTRAST  Technique:  Multidetector CT imaging of the head and cervical spine was performed following the standard protocol without intravenous contrast.  Multiplanar CT image reconstructions of the cervical  spine were also generated.  Comparison:  10/08/2010 and previous  CT HEAD  Findings: The brain shows generalized atrophy with extensive chronic small vessel disease throughout the white matter.  No sign of acute infarction, mass lesion, hemorrhage, hydrocephalus or extra-axial collection.  No skull fracture.  Sinuses, middle ears and mastoids are clear.  IMPRESSION: Atrophy and chronic small vessel disease.  No acute or traumatic finding.  No change since previous study.  CT CERVICAL SPINE  Findings: No evidence of fracture or traumatic malalignment.  No significant degenerative change either of the discs or facets.  No focal lesion.  IMPRESSION: Remarkably normal scan for person of this age.  No acute or traumatic finding.  Only minimal degenerative changes.  Original Report Authenticated By: Thomasenia Sales, M.D.   Ct Cervical Spine Wo Contrast  01/05/2011  *RADIOLOGY REPORT*  Clinical Data:  Fall.  Head and neck pain.  CT HEAD WITHOUT CONTRAST CT CERVICAL SPINE WITHOUT CONTRAST  Technique:  Multidetector CT imaging of the head and cervical spine  was performed following the standard protocol without intravenous contrast.  Multiplanar CT image reconstructions of the cervical spine were also generated.  Comparison:  10/08/2010 and previous  CT HEAD  Findings: The brain shows generalized atrophy with extensive chronic small vessel disease throughout the white matter.  No sign of acute infarction, mass lesion, hemorrhage, hydrocephalus or extra-axial collection.  No skull fracture.  Sinuses, middle ears and mastoids are clear.  IMPRESSION: Atrophy and chronic small vessel disease.  No acute or traumatic finding.  No change since previous study.  CT CERVICAL SPINE  Findings: No evidence of fracture or traumatic malalignment.  No significant degenerative change either of the discs or facets.  No focal lesion.  IMPRESSION: Remarkably normal scan for person of this age.  No acute or traumatic finding.  Only minimal  degenerative changes.  Original Report Authenticated By: Thomasenia Sales, M.D.   Mr Lumbar Spine W Wo Contrast  01/06/2011  *RADIOLOGY REPORT*  Clinical Data: Evaluate compression fractures.  MRI LUMBAR SPINE WITHOUT AND WITH CONTRAST  Technique:  Multiplanar and multiecho pulse sequences of the lumbar spine were obtained without and with intravenous contrast.  Contrast: 10mL MULTIHANCE GADOBENATE DIMEGLUMINE 529 MG/ML IV SOLN  Comparison: 01/05/2011 plain film exam. 10/19/2008 CT.  No comparison MR.  Findings: Last fully open disc space is labeled L5-S1.  Present examination incorporates from upper T11 through the lower sacral region.  Conus L1-2 level.  Visualized paravertebral structures demonstrate adrenal hyperplasia otherwise unremarkable.  Heterogeneous bone marrow probably related to osteoporosis. Abnormal signal throughout the L3 vertebra greater to left midline. Appearance is most suggestive of hemangioma (when combined with CT appearance).  Nonspecific rounded 8 mm lesion within the left aspect of the L2 vertebral body does not demonstrate characteristics of a hemangioma. Malignancy not excluded.  Stability of this can be confirmed on follow-up  Anterior wedge compression fracture of the L1 vertebral body with 90% loss of height centrally, 80% loss of height anteriorly and with retropulsion of the posterior-superior aspect of the compressed vertebra contributing to mild spinal stenosis greater on the left.  This approaches but does not compress the conus.  Mild paravertebral soft tissue prominence may represent hemorrhage.  No epidural extension.  T12 superior endplate compression fracture with 15 - 20% loss of height.  This extends into the right pedicle.  Minimal retropulsion of the right posterior lateral aspect of the compressed superior endplate without significant spinal stenosis.  Tiny amount of epidural blood extends from the right T12 pedicle level superiorly to the T11-T12 disc space level.   Subtle L4 superior endplate compression fracture greater to the right of midline.  Per CMS PQRI reporting requirements (PQRI Measure 24): Given the patient's age of greater than 50 and the fracture site (hip, distal radius, or spine), the patient should be tested for osteoporosis using DXA, and the appropriate treatment considered based on the DXA results.  T11-12:  Mild facet joint degenerative changes.  T12-L1:  Mild spinal stenosis greater on the left as discussed above.  L1-2:  Minimal facet joint degenerative changes.  Minimal bulge.  L2-3:  Mild facet joint degenerative changes.  Mild bulge.  Mild ligamentum flavum hypertrophy.  Very mild spinal stenosis with a trefoil appearance.  L3-4:  Moderate bilateral facet joint degenerative changes.  3.8 mm anterior slip of L3.  Bulge/broad-based protrusion slightly greater right lateral position with mild encroachment upon the exiting right L3 nerve root which does not appear significantly compressed. Ligamentum flavum hypertrophy.  Multifactorial mild  to slightly moderate spinal stenosis and lateral recess stenosis.  L4-5:  Moderate facet joint degenerative changes.  Ligamentum flavum hypertrophy greater on the left with slight impression upon the left lateral aspect of the thecal sac.  2.4 mm anterior slip of L4.  Bulge.  Mild to slightly moderate superior lateral recess stenosis.  Mild spinal stenosis.  L5-S1:  Facet joint degenerative changes.  Mild bulge.  No significant spinal stenosis or foraminal narrowing.  IMPRESSION: Compression fractures of T12, L1 and L4 as detailed above.  There is a small amount of epidural blood on the right extending from the T11-12 disc space to the right T12 pedicle level without significant associated mass effect.  Probable hemangioma L3 vertebral body.  Nonspecific 8 mm lesion within the left aspect of the L2 vertebral body.  Stability can be confirmed on follow-up.  Scattered degenerative changes as detailed above with  multifactorial spinal stenosis and lateral recess stenosis most prominent at the L3-4 level.  Original Report Authenticated By: Fuller Canada, M.D.    Medications: I have reviewed the patient's current medications. Scheduled Meds:    . aspirin EC  81 mg Oral Daily  . Boost Plus  237 mL Oral TID WC  . cholecalciferol  1,000 Units Oral Daily  . famotidine  20 mg Oral BID  . mirtazapine  15 mg Oral QHS  . multivitamins ther. w/minerals  1 tablet Oral Daily  . oxyCODONE  5 mg Oral TID WC  . potassium chloride  40 mEq Oral BID  . senna  1 tablet Oral BID   Continuous Infusions:    . DISCONTD: 0.9 % NaCl with KCl 20 mEq / L 75 mL/hr at 01/07/11 1845   PRN Meds:.alum & mag hydroxide-simeth, bisacodyl, hydrALAZINE, HYDROcodone-acetaminophen, ondansetron Assessment/Plan: Patient Active Hospital Problem List: Compression fracture of L1 lumbar vertebra (01/05/2011)   Assessment: Patient's pain seems to be markedly decreased and is doing well in terms of mobility using her brace post discharge this patient is for skilled nursing facility and social worker started that process. The patient will remain hospitalized until she's placed in a skilled nursing facility as returning to her home at this time will  put the patient on safe environment.   Fall (01/05/2011)   Assessment: Mechanical fall without any syncope. Physical therapy worked with patient increase functional mobility.   Hyponatremia (01/05/2011)   Assessment: Improved with hydration. Will continue to monitor. However I do believe that this is nutritional owing to the patient's very poor oral intake.     Osteoporosis (01/05/2011)   Assessment: Will defer to outpatient physician but will recommend bisphosphonate at time of discharge.   Dizziness (01/09/2011)    Assessment: We'll check orthostatics on this patient. Patient had a CT scan within the last 48 hours which shows no acute intracranial abnormality    discharge plan will  speak to social work about starting the process of post discharge placement a skilled nursing facility. Not yet medically stable for discharge  LOS: 4 days

## 2011-01-09 NOTE — Progress Notes (Signed)
Subjective: Improving.    Objective: Vital signs in last 24 hours: Temp:  [97.7 F (36.5 C)-99.1 F (37.3 C)] 99.1 F (37.3 C) (12/16 0550) Pulse Rate:  [60-72] 72  (12/16 0550) Resp:  [16-18] 16  (12/16 0550) BP: (145-179)/(77-85) 145/77 mmHg (12/16 0550) SpO2:  [96 %-97 %] 96 % (12/16 0550)  Intake/Output from previous day: 12/15 0701 - 12/16 0700 In: 850 [P.O.:850] Out: 500 [Urine:500] Intake/Output this shift:     Basename 01/08/11 0320  HGB 12.3    Basename 01/08/11 0320  WBC 6.3  RBC 3.73*  HCT 34.0*  PLT 204    Basename 01/09/11 0401 01/08/11 0320  NA 132* 133*  K 4.0 3.4*  CL 100 101  CO2 24 25  BUN 8 12  CREATININE 0.44* 0.54  GLUCOSE 101* 93  CALCIUM 10.1 9.8   No results found for this basename: LABPT:2,INR:2 in the last 72 hours  Neurologically intact Dorsiflexion/Plantar flexion intact  Assessment/Plan: Should use Lumbar support when ambulating. Will see in office in 2-Weeks   Emmons Toth A 01/09/2011, 8:21 AM

## 2011-01-10 MED ORDER — ENSURE CLINICAL ST REVIGOR PO LIQD
237.0000 mL | Freq: Two times a day (BID) | ORAL | Status: DC
  Administered 2011-01-10 – 2011-01-11 (×2): 237 mL via ORAL

## 2011-01-10 NOTE — Progress Notes (Addendum)
Patient is cleared for d/c to SNF.  CSW met with patient and patients spouse at bedside. Discussed need for SNF. Patient is agreeable to CSW faxing information out to facilities and getting bed offers but states that son is coming and will be here by noon. They would like him involved on the final decision. CSW to follow for bed offers. FL-2 completed and faxed out to facilities.  Psychosocial assessment completed and placed in shadow chart.   Logun Colavito C. Trichelle Lehan MSW, [place] [phone] CSW made spouse aware of bed offers. He will review with family and let CSW know in the next hour.  Ayvion Kavanagh C. Dalma Panchal MSW, LCSW [phone]  family requesting [facility]. CSW awaiting final confirmation from [facility].  Katia Hannen C. Raevin Wierenga MSW, LCSW [phone]

## 2011-01-10 NOTE — Progress Notes (Signed)
INITIAL ADULT NUTRITION ASSESSMENT Date: 01/10/2011   Time: 11:40 AM Reason for Assessment: Consult  ASSESSMENT: Female 77 y.o.  Dx: Compression fracture of L1 lumbar vertebra  Hx:  Past Medical History  Diagnosis Date  . Peptic ulcer disease   . Osteoporosis   . Depression   . Anxiety   . Hyperlipemia   . IgG monoclonal gammopathy   . Compression fracture of L2 lumbar vertebra    Related Meds:  Scheduled Meds:   . aspirin EC  81 mg Oral Daily  . Boost Plus  237 mL Oral TID WC  . cholecalciferol  1,000 Units Oral Daily  . famotidine  20 mg Oral BID  . mirtazapine  15 mg Oral QHS  . multivitamins ther. w/minerals  1 tablet Oral Daily  . oxyCODONE  5 mg Oral TID WC  . potassium chloride  40 mEq Oral BID  . senna  1 tablet Oral BID   Continuous Infusions:   . DISCONTD: 0.9 % NaCl with KCl 20 mEq / L 75 mL/hr at 01/07/11 1845   PRN Meds:.alum & mag hydroxide-simeth, bisacodyl, hydrALAZINE, HYDROcodone-acetaminophen, ondansetron  Ht: 5\' 1"  (154.9 cm)  Wt: 110 lb (49.896 kg)  Ideal Wt: 47.8 kg  % Ideal Wt: 104  Usual Wt: Unable to assess  11/02/10  51kg per cancer center records  Body mass index is 20.78 kg/(m^2).  Food/Nutrition Related Hx: Pt and husband report that pt did not eat her breakfast. Noted pt ate 80-100% of meals yesterday. Pt reports not being hungry and not eating well PTA r/t lack of appetite. Pt thinks she may have been losing with PTA, unable to state usual weight. Noted MD reports pt stated she eats one meal every 2 days. Pt denies any problems chewing or swallowing.   Labs:  CMP     Component Value Date/Time   NA 132* 01/09/2011 0401   K 4.0 01/09/2011 0401   CL 100 01/09/2011 0401   CO2 24 01/09/2011 0401   GLUCOSE 101* 01/09/2011 0401   BUN 8 01/09/2011 0401   CREATININE 0.44* 01/09/2011 0401   CALCIUM 10.1 01/09/2011 0401   PROT 7.8 05/12/2010 1026   PROT 7.8 05/12/2010 1026   PROT 7.8 05/12/2010 1026   PROT 7.8 05/12/2010 1026   PROT 7.8 05/12/2010 1026   PROT 7.8 05/12/2010 1026   ALBUMIN 4.3 05/12/2010 1026   ALBUMIN 4.3 05/12/2010 1026   ALBUMIN 4.3 05/12/2010 1026   ALBUMIN 4.3 05/12/2010 1026   ALBUMIN 4.3 05/12/2010 1026   ALBUMIN 4.3 05/12/2010 1026   AST 19 05/12/2010 1026   AST 19 05/12/2010 1026   AST 19 05/12/2010 1026   AST 19 05/12/2010 1026   AST 19 05/12/2010 1026   AST 19 05/12/2010 1026   ALT 9 05/12/2010 1026   ALT 9 05/12/2010 1026   ALT 9 05/12/2010 1026   ALT 9 05/12/2010 1026   ALT 9 05/12/2010 1026   ALT 9 05/12/2010 1026   ALKPHOS 64 05/12/2010 1026   ALKPHOS 64 05/12/2010 1026   ALKPHOS 64 05/12/2010 1026   ALKPHOS 64 05/12/2010 1026   ALKPHOS 64 05/12/2010 1026   ALKPHOS 64 05/12/2010 1026   BILITOT 0.5 05/12/2010 1026   BILITOT 0.5 05/12/2010 1026   BILITOT 0.5 05/12/2010 1026   BILITOT 0.5 05/12/2010 1026   BILITOT 0.5 05/12/2010 1026   BILITOT 0.5 05/12/2010 1026   GFRNONAA >90 01/09/2011 0401   GFRAA >90 01/09/2011 0401    Intake/Output Summary (Last  24 hours) at 01/10/11 1145 Last data filed at 01/10/11 0820  Gross per 24 hour  Intake   1415 ml  Output   1450 ml  Net    -35 ml    Diet Order: General  IVF:    DISCONTD: 0.9 % NaCl with KCl 20 mEq / L Last Rate: 75 mL/hr at 01/07/11 1845    Estimated Nutritional Needs:   Kcal:1500-1700 Protein:60-75g Fluid:1.5-1.7L  NUTRITION DIAGNOSIS: -Inadequate oral intake (NI-2.1).  Status: Ongoing -Pt underweight for age and build r/t BMI of 20 and may meet criteria for some degree of mild malnutrition r/t 2.6 pound weight loss in past 2-3 months and thin extremities   RELATED TO: poor appetite  AS EVIDENCE BY: pt statement, 0% breakfast intake  MONITORING/EVALUATION(Goals): Pt to consume >75% of meals.   EDUCATION NEEDS: -No education needs identified at this time  INTERVENTION: Strawberry Ensure Clinical Strength BID. Gave pt cereal, milk, and applesauce from floorstock. Encouraged increased intake. Will monitor.   Dietitian   [phone]  DOCUMENTATION CODES Per approved criteria  -Underweight    Marshall Cork 01/10/2011, 11:40 AM

## 2011-01-10 NOTE — Progress Notes (Signed)
Subjective: Patient has no complaints today her son is at the bedside. The patient has a bed at [facility] skilled nursing facility for tomorrow.    Objective: Filed Vitals:   01/09/11 1344 01/09/11 2055 01/10/11 0545 01/10/11 1328  BP: 126/76 120/72 112/71 95/69  Pulse: 76 75 97 96  Temp: 98.4 F (36.9 C) 98 F (36.7 C) 98.5 F (36.9 C) 99 F (37.2 C)  TempSrc: Oral Oral Oral Oral  Resp: 19 18 16 18   Height:      Weight:      SpO2: 97% 96% 95% 96%   Weight change:   Intake/Output Summary (Last 24 hours) at 01/10/11 1801 Last data filed at 01/10/11 1328  Gross per 24 hour  Intake    720 ml  Output    800 ml  Net    -80 ml   General: Alert, awake, oriented x3, in no acute distress.  HEENT: [place]/AT PEERL, EOMI Neck: Trachea midline,  no masses, no thyromegal,y no JVD, no carotid bruit OROPHARYNX:  Moist, No exudate/ erythema/lesions.  Heart: Regular rate and rhythm, without murmurs, rubs, gallops, PMI non-displaced, no heaves or thrills on palpation.  Lungs: Clear to auscultation, no wheezing or rhonchi noted. No increased vocal fremitus resonant to percussion  Abdomen: Soft, nontender, nondistended, positive bowel sounds, no masses no hepatosplenomegaly noted..  Neuro: No focal neurological deficits noted cranial nerves II through XII grossly intact. DTRs 2+ bilaterally upper and lower extremities. Strength 3+/5 in bilateral upper and lower extremities. Musculoskeletal: No warm swelling or erythema around joints, no spinal tenderness noted. Psychiatric: Patient alert and oriented x3, good insight and cognition, good recent to remote recall. Lymph node survey: No cervical axillary or inguinal lymphadenopathy noted.   Lab Results:  [facility] 01/09/11 0401 01/08/11 0320  NA 132* 133*  K 4.0 3.4*  CL 100 101  CO2 24 25  GLUCOSE 101* 93  BUN 8 12  CREATININE 0.44* 0.54  CALCIUM 10.1 9.8  MG -- 1.7  PHOS -- --   No results found for this basename:  AST:2,ALT:2,ALKPHOS:2,BILITOT:2,PROT:2,ALBUMIN:2 in the last 72 hours No results found for this basename: LIPASE:2,AMYLASE:2 in the last 72 hours  Basename 01/08/11 0320  WBC 6.3  NEUTROABS 3.8  HGB 12.3  HCT 34.0*  MCV 91.2  PLT 204   No results found for this basename: CKTOTAL:3,CKMB:3,CKMBINDEX:3,TROPONINI:3 in the last 72 hours No components found with this basename: POCBNP:3 No results found for this basename: DDIMER:2 in the last 72 hours No results found for this basename: HGBA1C:2 in the last 72 hours No results found for this basename: CHOL:2,HDL:2,LDLCALC:2,TRIG:2,CHOLHDL:2,LDLDIRECT:2 in the last 72 hours No results found for this basename: TSH,T4TOTAL,FREET3,T3FREE,THYROIDAB in the last 72 hours No results found for this basename: VITAMINB12:2,FOLATE:2,FERRITIN:2,TIBC:2,IRON:2,RETICCTPCT:2 in the last 72 hours  Micro Results: Recent Results (from the past 240 hour(s))  URINE CULTURE     Status: Normal   Collection Time   01/05/11  7:41 PM      Component Value Range Status Comment   Specimen Description URINE, CLEAN CATCH   Final    Special Requests NONE   Final    Setup Time [id number]   Final    Colony Count >=100,000 COLONIES/ML   Final    Culture     Final    Value: DIPHTHEROIDS(CORYNEBACTERIUM SPECIES)     Note: Standardized susceptibility testing for this organism is not available.   Report Status 01/07/2011 FINAL   Final     Studies/Results: Dg Chest 2 View  01/05/2011  *RADIOLOGY REPORT*  Clinical Data: Fall  CHEST - 2 VIEW  Comparison: None.  Findings: Prominent lung markings suggestive of chronic lung disease.  Negative for infiltrate or effusion.  Negative for heart failure.  Heart size is mildly enlarged.  Compression fracture of L1.  See lumbar spine report.  IMPRESSION: No active cardiopulmonary disease.  Original Report Authenticated By: Camelia Phenes, M.D.   Dg Ribs Unilateral Right  01/05/2011  *RADIOLOGY REPORT*  Clinical Data: Fall.  Pain   RIGHT RIBS - 2 VIEW  Comparison: None.  Findings: Negative for rib fracture on the right.  No focal bony lesion.  IMPRESSION: Negative  Original Report Authenticated By: Camelia Phenes, M.D.   Dg Lumbar Spine Complete  01/05/2011  *RADIOLOGY REPORT*  Clinical Data: Fall.  Back pain  LUMBAR SPINE - COMPLETE 4+ VIEW  Comparison: 10/08/2010  Findings: Moderate to severe fracture of L1 has progressed in severity since the prior study.  This may be related to the recent fall.  No other fractures.  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  Grade 1 slip L4 on L5 with mild disc degeneration at L3-4 and L4-5. Atherosclerotic disease in the aorta.  IMPRESSION: Moderate to severe compression fracture of L1, with progression since 10/08/2010.  Per CMS PQRS reporting requirements (PQRS Measure 24): Given the patient's age of greater than 50 and the fracture site (hip, distal radius, or spine), the patient should be tested for osteoporosis using DXA, and the appropriate treatment considered based on the DXA results.  Original Report Authenticated By: Camelia Phenes, M.D.   Dg Pelvis 1-2 Views  01/05/2011  *RADIOLOGY REPORT*  Clinical Data: Fall.  Pain  PELVIS - 1-2 VIEW  Comparison: 10/18/2010  Findings: Both hips are in normal alignment.  No significant hip degeneration.  Negative for pelvic or hip fracture. Atherosclerotic disease in the iliac arteries.  IMPRESSION: Negative for fracture.  Original Report Authenticated By: Camelia Phenes, M.D.   Ct Head Wo Contrast  01/05/2011  *RADIOLOGY REPORT*  Clinical Data:  Fall.  Head and neck pain.  CT HEAD WITHOUT CONTRAST CT CERVICAL SPINE WITHOUT CONTRAST  Technique:  Multidetector CT imaging of the head and cervical spine was performed following the standard protocol without intravenous contrast.  Multiplanar CT image reconstructions of the cervical spine were also generated.  Comparison:  10/08/2010 and previous  CT  HEAD  Findings: The brain shows generalized atrophy with extensive chronic small vessel disease throughout the white matter.  No sign of acute infarction, mass lesion, hemorrhage, hydrocephalus or extra-axial collection.  No skull fracture.  Sinuses, middle ears and mastoids are clear.  IMPRESSION: Atrophy and chronic small vessel disease.  No acute or traumatic finding.  No change since previous study.  CT CERVICAL SPINE  Findings: No evidence of fracture or traumatic malalignment.  No significant degenerative change either of the discs or facets.  No focal lesion.  IMPRESSION: Remarkably normal scan for person of this age.  No acute or traumatic finding.  Only minimal degenerative changes.  Original Report Authenticated By: Thomasenia Sales, M.D.   Ct Cervical Spine Wo Contrast  01/05/2011  *RADIOLOGY REPORT*  Clinical Data:  Fall.  Head and neck pain.  CT HEAD WITHOUT CONTRAST CT CERVICAL SPINE WITHOUT CONTRAST  Technique:  Multidetector CT imaging of the head and cervical spine was performed following the standard protocol without intravenous contrast.  Multiplanar CT image reconstructions of  the cervical spine were also generated.  Comparison:  10/08/2010 and previous  CT HEAD  Findings: The brain shows generalized atrophy with extensive chronic small vessel disease throughout the white matter.  No sign of acute infarction, mass lesion, hemorrhage, hydrocephalus or extra-axial collection.  No skull fracture.  Sinuses, middle ears and mastoids are clear.  IMPRESSION: Atrophy and chronic small vessel disease.  No acute or traumatic finding.  No change since previous study.  CT CERVICAL SPINE  Findings: No evidence of fracture or traumatic malalignment.  No significant degenerative change either of the discs or facets.  No focal lesion.  IMPRESSION: Remarkably normal scan for person of this age.  No acute or traumatic finding.  Only minimal degenerative changes.  Original Report Authenticated By: Thomasenia Sales,  M.D.   Mr Lumbar Spine W Wo Contrast  01/06/2011  *RADIOLOGY REPORT*  Clinical Data: Evaluate compression fractures.  MRI LUMBAR SPINE WITHOUT AND WITH CONTRAST  Technique:  Multiplanar and multiecho pulse sequences of the lumbar spine were obtained without and with intravenous contrast.  Contrast: 10mL MULTIHANCE GADOBENATE DIMEGLUMINE 529 MG/ML IV SOLN  Comparison: 01/05/2011 plain film exam. 10/19/2008 CT.  No comparison MR.  Findings: Last fully open disc space is labeled L5-S1.  Present examination incorporates from upper T11 through the lower sacral region.  Conus L1-2 level.  Visualized paravertebral structures demonstrate adrenal hyperplasia otherwise unremarkable.  Heterogeneous bone marrow probably related to osteoporosis. Abnormal signal throughout the L3 vertebra greater to left midline. Appearance is most suggestive of hemangioma (when combined with CT appearance).  Nonspecific rounded 8 mm lesion within the left aspect of the L2 vertebral body does not demonstrate characteristics of a hemangioma. Malignancy not excluded.  Stability of this can be confirmed on follow-up  Anterior wedge compression fracture of the L1 vertebral body with 90% loss of height centrally, 80% loss of height anteriorly and with retropulsion of the posterior-superior aspect of the compressed vertebra contributing to mild spinal stenosis greater on the left.  This approaches but does not compress the conus.  Mild paravertebral soft tissue prominence may represent hemorrhage.  No epidural extension.  T12 superior endplate compression fracture with 15 - 20% loss of height.  This extends into the right pedicle.  Minimal retropulsion of the right posterior lateral aspect of the compressed superior endplate without significant spinal stenosis.  Tiny amount of epidural blood extends from the right T12 pedicle level superiorly to the T11-T12 disc space level.  Subtle L4 superior endplate compression fracture greater to the right of  midline.  Per CMS PQRI reporting requirements (PQRI Measure 24): Given the patient's age of greater than 50 and the fracture site (hip, distal radius, or spine), the patient should be tested for osteoporosis using DXA, and the appropriate treatment considered based on the DXA results.  T11-12:  Mild facet joint degenerative changes.  T12-L1:  Mild spinal stenosis greater on the left as discussed above.  L1-2:  Minimal facet joint degenerative changes.  Minimal bulge.  L2-3:  Mild facet joint degenerative changes.  Mild bulge.  Mild ligamentum flavum hypertrophy.  Very mild spinal stenosis with a trefoil appearance.  L3-4:  Moderate bilateral facet joint degenerative changes.  3.8 mm anterior slip of L3.  Bulge/broad-based protrusion slightly greater right lateral position with mild encroachment upon the exiting right L3 nerve root which does not appear significantly compressed. Ligamentum flavum hypertrophy.  Multifactorial mild to slightly moderate spinal stenosis and lateral recess stenosis.  L4-5:  Moderate facet joint  degenerative changes.  Ligamentum flavum hypertrophy greater on the left with slight impression upon the left lateral aspect of the thecal sac.  2.4 mm anterior slip of L4.  Bulge.  Mild to slightly moderate superior lateral recess stenosis.  Mild spinal stenosis.  L5-S1:  Facet joint degenerative changes.  Mild bulge.  No significant spinal stenosis or foraminal narrowing.  IMPRESSION: Compression fractures of T12, L1 and L4 as detailed above.  There is a small amount of epidural blood on the right extending from the T11-12 disc space to the right T12 pedicle level without significant associated mass effect.  Probable hemangioma L3 vertebral body.  Nonspecific 8 mm lesion within the left aspect of the L2 vertebral body.  Stability can be confirmed on follow-up.  Scattered degenerative changes as detailed above with multifactorial spinal stenosis and lateral recess stenosis most prominent at the  L3-4 level.  Original Report Authenticated By: Fuller Canada, M.D.    Medications: I have reviewed the patient's current medications. Scheduled Meds:    . aspirin EC  81 mg Oral Daily  . cholecalciferol  1,000 Units Oral Daily  . famotidine  20 mg Oral BID  . feeding supplement  237 mL Oral BID WC  . mirtazapine  15 mg Oral QHS  . multivitamins ther. w/minerals  1 tablet Oral Daily  . oxyCODONE  5 mg Oral TID WC  . potassium chloride  40 mEq Oral BID  . senna  1 tablet Oral BID  . DISCONTD: Boost Plus  237 mL Oral TID WC   Continuous Infusions:   PRN Meds:.alum & mag hydroxide-simeth, bisacodyl, hydrALAZINE, HYDROcodone-acetaminophen, ondansetron Assessment/Plan: Patient Active Hospital Problem List:  Compression fracture of L1 lumbar vertebra (01/05/2011)   Assessment: Patient's pain seems to be markedly decreased and is doing well in terms of mobility using her brace post discharge this patient is for skilled nursing facility and social worker started that process. The patient will remain hospitalized until she's placed in a skilled nursing facility as returning to her home at this time will  put the patient on safe environment.   Fall (01/05/2011)   Assessment: Mechanical fall without any syncope. Physical therapy worked with patient increase functional mobility.   Hyponatremia (01/05/2011)   Assessment: Improved with hydration. Will continue to monitor. This appears to be chronic and is asymptomatic in nature- no further intervention.    Osteoporosis (01/05/2011)   Assessment: Will defer to outpatient physician but will recommend bisphosphonate at time of discharge.   Dizziness (01/09/2011)    Assessment: We'll check orthostatics on this patient. Patient had a CT scan within the last 48 hours which shows no acute intracranial abnormality    discharge plan : For discharge to [facility] skilled nursing facility on   edically stable for discharge  LOS: 5 days

## 2011-01-10 NOTE — Progress Notes (Signed)
Physical Therapy Treatment Patient Details Name: Dana Bush MRN: [id number] DOB: 1926/11/08 Today's Date: 01/10/2011 [id number] 1GT, 1TA  PT Assessment/Plan  PT - Assessment/Plan Comments on Treatment Session: Patient with improved tolerance today after scheuduled and PRN meds given for pain.  Still little dizzy when asked, but she did not initiate complaint for dizziness today.  Posterior bias with balance keeps patient at high risk for falls even with a device.  Agree with SNF level rehab at d/c. PT Plan: Discharge plan remains appropriate PT Frequency: Min 3X/week Follow Up Recommendations: Skilled nursing facility Equipment Recommended: Defer to next venue PT Goals  Acute Rehab PT Goals Pt will Roll Supine to Left Side: with modified independence;with rail PT Goal: Rolling Supine to Left Side - Progress: Progressing toward goal (to right today) Pt will go Supine/Side to Sit: with modified independence;with rail PT Goal: Supine/Side to Sit - Progress: Progressing toward goal Pt will go Sit to Stand: with supervision PT Goal: Sit to Stand - Progress: Progressing toward goal Pt will go Stand to Sit: with supervision PT Goal: Stand to Sit - Progress: Progressing toward goal Pt will Ambulate: 16 - 50 feet;with min assist;with rolling walker PT Goal: Ambulate - Progress: Progressing toward goal  PT Treatment Precautions/Restrictions  Precautions Precautions: Fall;Back Precaution Comments: fell prior to this admission and a couple of months ago Required Braces or Orthoses: Yes Spinal Brace: Lumbar corset;Applied in sitting position Restrictions Weight Bearing Restrictions: No Mobility (including Balance) Bed Mobility Rolling Right: 4: Min assist;With rail Rolling Right Details (indicate cue type and reason): cues for technique Right Sidelying to Sit: 4: Min assist;HOB flat;With rails Right Sidelying to Sit Details (indicate cue type and reason): cues for  technique Transfers Sit to Stand: 4: Min assist;From bed Sit to Stand Details (indicate cue type and reason): cues for safety Stand to Sit: 4: Min assist;To chair/3-in-1;With armrests Stand to Sit Details: cues for armrests Ambulation/Gait Ambulation/Gait Assistance: 3: Mod assist Ambulation/Gait Assistance Details (indicate cue type and reason): assit for safety due to fear of falling and posterior bias Ambulation Distance (Feet): 12 Feet Assistive device: Rolling walker Gait Pattern: Decreased stride length;Shuffle  Posture/Postural Control Posture/Postural Control: No significant limitations Balance Balance Assessed: Yes Static Standing Balance Static Standing - Balance Support: Bilateral upper extremity supported Static Standing - Level of Assistance: 3: Mod assist Static Standing - Comment/# of Minutes: gave pt cues and facilitation for forward weight shift and decrease reliance of legs on bed behind her (approx 1 minute total static before attempting gait) Exercise    End of Session PT - End of Session Equipment Utilized During Treatment: Back brace Activity Tolerance: Other (comment) (limited by fear) Patient left: in chair;with call bell in reach;with family/visitor present General Behavior During Session: [facility] for tasks performed (seems docile and a little fearful) Cognition: Impaired, at baseline  [facility] 01/10/2011, 11:34 AM

## 2011-01-11 MED ORDER — OXYCODONE HCL 5 MG PO TABS: 5.0000 mg | ORAL_TABLET | Freq: Three times a day (TID) | ORAL | Status: AC

## 2011-01-11 MED ORDER — POTASSIUM CHLORIDE CRYS ER 20 MEQ PO TBCR: 40.0000 meq | EXTENDED_RELEASE_TABLET | Freq: Two times a day (BID) | ORAL | Status: DC

## 2011-01-11 MED ORDER — ENSURE CLINICAL ST REVIGOR PO LIQD: 237.0000 mL | Freq: Two times a day (BID) | ORAL | Status: DC

## 2011-01-11 MED ORDER — OXYCODONE HCL 5 MG PO TABS: 5.0000 mg | ORAL_TABLET | Freq: Three times a day (TID) | ORAL | Status: DC

## 2011-01-11 NOTE — Progress Notes (Signed)
Patient discharge to skilled nursing facility. All discharge medications and instructions reviewed and copies to be sent to facility.

## 2011-01-11 NOTE — Discharge Summary (Signed)
Dana Bush MRN: [id number] DOB/AGE: 1926/04/21 77 y.o.  Admit date: 01/05/2011 Discharge date: 01/11/2011  Primary Care Physician:  Londell Moh, MD, MD   Discharge Diagnoses:   Patient Active Problem List  Diagnoses  . Compression fracture of L1 lumbar vertebra  . Fall  . Hyponatremia  . Osteoporosis  . Malnutrition of mild degree  . Dizziness    DISCHARGE MEDICATION: Current Discharge Medication List    START taking these medications   Details  feeding supplement (ENSURE CLINICAL STRENGTH) LIQD Take 237 mLs by mouth 2 (two) times daily with a meal.    oxyCODONE (OXY IR/ROXICODONE) 5 MG immediate release tablet Take 1 tablet (5 mg total) by mouth 3 (three) times daily with meals. Qty: 90 tablet, Refills: 0    potassium chloride SA (K-DUR,KLOR-CON) 20 MEQ tablet Take 2 tablets (40 mEq total) by mouth 2 (two) times daily.      CONTINUE these medications which have NOT CHANGED   Details  aspirin EC 81 MG tablet Take 81 mg by mouth daily.      cholecalciferol (VITAMIN D) 1000 UNITS tablet Take 1,000 Units by mouth daily.      HYDROcodone-acetaminophen (NORCO) 5-325 MG per tablet Take 1-2 tablets by mouth every 6 (six) hours as needed. For pain.     mirtazapine (REMERON) 15 MG tablet Take 15 mg by mouth at bedtime.      Multiple Vitamins-Minerals (MULTIVITAMINS THER. W/MINERALS) TABS Take 1 tablet by mouth daily.      naproxen (NAPROSYN) 500 MG tablet Take 500 mg by mouth 2 (two) times daily as needed. For pain.     traMADol (ULTRAM) 50 MG tablet Take 50 mg by mouth every 8 (eight) hours as needed. Maximum dose= 8 tablets per day; for pain.            Consults: Treatment Team:  Buena Irish, MD Ranee Gosselin, MD  SIGNIFICANT DIAGNOSTIC STUDIES:  Dg Chest 2 View  01/05/2011  *RADIOLOGY REPORT*  Clinical Data: Fall  CHEST - 2 VIEW  Comparison: None.  Findings: Prominent lung markings suggestive of chronic lung disease.  Negative for  infiltrate or effusion.  Negative for heart failure.  Heart size is mildly enlarged.  Compression fracture of L1.  See lumbar spine report.  IMPRESSION: No active cardiopulmonary disease.  Original Report Authenticated By: Camelia Phenes, M.D.   Dg Ribs Unilateral Right  01/05/2011  *RADIOLOGY REPORT*  Clinical Data: Fall.  Pain  RIGHT RIBS - 2 VIEW  Comparison: None.  Findings: Negative for rib fracture on the right.  No focal bony lesion.  IMPRESSION: Negative  Original Report Authenticated By: Camelia Phenes, M.D.   Dg Lumbar Spine Complete  01/05/2011  *RADIOLOGY REPORT*  Clinical Data: Fall.  Back pain  LUMBAR SPINE - COMPLETE 4+ VIEW  Comparison: 10/08/2010  Findings: Moderate to severe fracture of L1 has progressed in severity since the prior study.  This may be related to the recent fall.  No other fractures.  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  Grade 1 slip L4 on L5 with mild disc degeneration at L3-4 and L4-5. Atherosclerotic disease in the aorta.  IMPRESSION: Moderate to severe compression fracture of L1, with progression since 10/08/2010.  Per CMS PQRS reporting requirements (PQRS Measure 24): Given the patient's age of greater than 50 and the fracture site (hip, distal radius, or spine), the patient should be tested for osteoporosis using DXA, and  the appropriate treatment considered based on the DXA results.  Original Report Authenticated By: Camelia Phenes, M.D.   Dg Pelvis 1-2 Views  01/05/2011  *RADIOLOGY REPORT*  Clinical Data: Fall.  Pain  PELVIS - 1-2 VIEW  Comparison: 10/18/2010  Findings: Both hips are in normal alignment.  No significant hip degeneration.  Negative for pelvic or hip fracture. Atherosclerotic disease in the iliac arteries.  IMPRESSION: Negative for fracture.  Original Report Authenticated By: Camelia Phenes, M.D.   Ct Head Wo Contrast  01/05/2011  *RADIOLOGY REPORT*  Clinical Data:  Fall.  Head and neck  pain.  CT HEAD WITHOUT CONTRAST CT CERVICAL SPINE WITHOUT CONTRAST  Technique:  Multidetector CT imaging of the head and cervical spine was performed following the standard protocol without intravenous contrast.  Multiplanar CT image reconstructions of the cervical spine were also generated.  Comparison:  10/08/2010 and previous  CT HEAD  Findings: The brain shows generalized atrophy with extensive chronic small vessel disease throughout the white matter.  No sign of acute infarction, mass lesion, hemorrhage, hydrocephalus or extra-axial collection.  No skull fracture.  Sinuses, middle ears and mastoids are clear.  IMPRESSION: Atrophy and chronic small vessel disease.  No acute or traumatic finding.  No change since previous study.  CT CERVICAL SPINE  Findings: No evidence of fracture or traumatic malalignment.  No significant degenerative change either of the discs or facets.  No focal lesion.  IMPRESSION: Remarkably normal scan for person of this age.  No acute or traumatic finding.  Only minimal degenerative changes.  Original Report Authenticated By: Thomasenia Sales, M.D.   Ct Cervical Spine Wo Contrast  01/05/2011  *RADIOLOGY REPORT*  Clinical Data:  Fall.  Head and neck pain.  CT HEAD WITHOUT CONTRAST CT CERVICAL SPINE WITHOUT CONTRAST  Technique:  Multidetector CT imaging of the head and cervical spine was performed following the standard protocol without intravenous contrast.  Multiplanar CT image reconstructions of the cervical spine were also generated.  Comparison:  10/08/2010 and previous  CT HEAD  Findings: The brain shows generalized atrophy with extensive chronic small vessel disease throughout the white matter.  No sign of acute infarction, mass lesion, hemorrhage, hydrocephalus or extra-axial collection.  No skull fracture.  Sinuses, middle ears and mastoids are clear.  IMPRESSION: Atrophy and chronic small vessel disease.  No acute or traumatic finding.  No change since previous study.  CT  CERVICAL SPINE  Findings: No evidence of fracture or traumatic malalignment.  No significant degenerative change either of the discs or facets.  No focal lesion.  IMPRESSION: Remarkably normal scan for person of this age.  No acute or traumatic finding.  Only minimal degenerative changes.  Original Report Authenticated By: Thomasenia Sales, M.D.   Mr Lumbar Spine W Wo Contrast  01/06/2011  *RADIOLOGY REPORT*  Clinical Data: Evaluate compression fractures.  MRI LUMBAR SPINE WITHOUT AND WITH CONTRAST  Technique:  Multiplanar and multiecho pulse sequences of the lumbar spine were obtained without and with intravenous contrast.  Contrast: 10mL MULTIHANCE GADOBENATE DIMEGLUMINE 529 MG/ML IV SOLN  Comparison: 01/05/2011 plain film exam. 10/19/2008 CT.  No comparison MR.  Findings: Last fully open disc space is labeled L5-S1.  Present examination incorporates from upper T11 through the lower sacral region.  Conus L1-2 level.  Visualized paravertebral structures demonstrate adrenal hyperplasia otherwise unremarkable.  Heterogeneous bone marrow probably related to osteoporosis. Abnormal signal throughout the L3 vertebra greater to left midline. Appearance is most suggestive of hemangioma (when  combined with CT appearance).  Nonspecific rounded 8 mm lesion within the left aspect of the L2 vertebral body does not demonstrate characteristics of a hemangioma. Malignancy not excluded.  Stability of this can be confirmed on follow-up  Anterior wedge compression fracture of the L1 vertebral body with 90% loss of height centrally, 80% loss of height anteriorly and with retropulsion of the posterior-superior aspect of the compressed vertebra contributing to mild spinal stenosis greater on the left.  This approaches but does not compress the conus.  Mild paravertebral soft tissue prominence may represent hemorrhage.  No epidural extension.  T12 superior endplate compression fracture with 15 - 20% loss of height.  This extends into  the right pedicle.  Minimal retropulsion of the right posterior lateral aspect of the compressed superior endplate without significant spinal stenosis.  Tiny amount of epidural blood extends from the right T12 pedicle level superiorly to the T11-T12 disc space level.  Subtle L4 superior endplate compression fracture greater to the right of midline.  Per CMS PQRI reporting requirements (PQRI Measure 24): Given the patient's age of greater than 50 and the fracture site (hip, distal radius, or spine), the patient should be tested for osteoporosis using DXA, and the appropriate treatment considered based on the DXA results.  T11-12:  Mild facet joint degenerative changes.  T12-L1:  Mild spinal stenosis greater on the left as discussed above.  L1-2:  Minimal facet joint degenerative changes.  Minimal bulge.  L2-3:  Mild facet joint degenerative changes.  Mild bulge.  Mild ligamentum flavum hypertrophy.  Very mild spinal stenosis with a trefoil appearance.  L3-4:  Moderate bilateral facet joint degenerative changes.  3.8 mm anterior slip of L3.  Bulge/broad-based protrusion slightly greater right lateral position with mild encroachment upon the exiting right L3 nerve root which does not appear significantly compressed. Ligamentum flavum hypertrophy.  Multifactorial mild to slightly moderate spinal stenosis and lateral recess stenosis.  L4-5:  Moderate facet joint degenerative changes.  Ligamentum flavum hypertrophy greater on the left with slight impression upon the left lateral aspect of the thecal sac.  2.4 mm anterior slip of L4.  Bulge.  Mild to slightly moderate superior lateral recess stenosis.  Mild spinal stenosis.  L5-S1:  Facet joint degenerative changes.  Mild bulge.  No significant spinal stenosis or foraminal narrowing.  IMPRESSION: Compression fractures of T12, L1 and L4 as detailed above.  There is a small amount of epidural blood on the right extending from the T11-12 disc space to the right T12 pedicle  level without significant associated mass effect.  Probable hemangioma L3 vertebral body.  Nonspecific 8 mm lesion within the left aspect of the L2 vertebral body.  Stability can be confirmed on follow-up.  Scattered degenerative changes as detailed above with multifactorial spinal stenosis and lateral recess stenosis most prominent at the L3-4 level.  Original Report Authenticated By: Fuller Canada, M.D.       Recent Results (from the past 240 hour(s))  URINE CULTURE     Status: Normal   Collection Time   01/05/11  7:41 PM      Component Value Range Status Comment   Specimen Description URINE, CLEAN CATCH   Final    Special Requests NONE   Final    Setup Time [id number]   Final    Colony Count >=100,000 COLONIES/ML   Final    Culture     Final    Value: DIPHTHEROIDS(CORYNEBACTERIUM SPECIES)     Note: Standardized susceptibility testing  for this organism is not available.   Report Status 01/07/2011 FINAL   Final     BRIEF ADMITTING H & P: Pt was putting up christmas decorations, turned and fell. She denies dizziness or chest pain. She was feeling fine at the time. Lumbar spinal x-ray showed moderate to severe Fx of L-1 vertebra.   Hospital Course:  Present on Admission:  Compression fracture of L1 lumbar vertebra (01/05/2011) Pt had an MRI which showed compression Fx's of T12,L1 And L4. There was also probable hematoma at L3 vertebral body. Dr. Darrelyn Hillock from Ortho saw the patient in consultation and recommended a L-S corset for spinal immobilization, and PT. The pt is being discharged to SNF for short term rehab. Pt should wear her L-S corset with all activity- remove for sleep.  Hyponatremia(01/03/11) Assessment: Improved with hydration. Will continue to monitor. This appears to be chronic and is asymptomatic in nature- no further intervention.   Fall (01/05/2011) Assessment: Mechanical fall without any syncope. Physical therapy worked with patient increase functional  mobility.  Osteoporosis (01/05/2011) Assessment: Will defer to outpatient physician but will recommend bisphosphonate at time of discharge.   Dizziness (01/09/2011)  Assessment: resolved  Discharge plan : For discharge to [facility] skilled nursing facility.     Disposition and Follow-up:  F/U with Dr. Renne Crigler in one week. F/U with Dr. Darrelyn Hillock in office in 2 weeks.  Discharge Orders    Future Appointments: Provider: Department: Dept Phone: Center:   03/08/2011 10:30 AM Eli Hose, MD [facility] [phone] None      DISCHARGE EXAM:  General: Alert, awake, oriented x3, in no acute distress.  Blood pressure 106/67, pulse 93, temperature 98.4 F (36.9 C), temperature source Oral, resp. rate 16, height 5\' 1"  (1.549 m), weight 49.896 kg (110 lb), SpO2 95.00%. HEENT: [place]/AT PEERL, EOMI  Neck: Trachea midline, no masses, no thyromegal,y no JVD, no carotid bruit  OROPHARYNX: Moist, No exudate/ erythema/lesions.  Heart: Regular rate and rhythm, without murmurs, rubs, gallops, PMI non-displaced, no heaves or thrills on palpation.  Lungs: Clear to auscultation, no wheezing or rhonchi noted. No increased vocal fremitus resonant to percussion  Abdomen: Soft, nontender, nondistended, positive bowel sounds, no masses no hepatosplenomegaly noted..  Neuro: No focal neurological deficits noted cranial nerves II through XII grossly intact. DTRs 2+ bilaterally upper and lower extremities. Strength 3+/5 in bilateral upper and lower extremities.  Musculoskeletal: No warm swelling or erythema around joints, no spinal tenderness noted.  Psychiatric: Patient alert and oriented x3, good insight and cognition, good recent to remote recall.  Lymph node survey: No cervical axillary or inguinal lymphadenopathy noted.       Basename 01/09/11 0401  NA 132*  K 4.0  CL 100  CO2 24  GLUCOSE 101*  BUN 8  CREATININE 0.44*  CALCIUM 10.1  MG --  PHOS --   No results found for this basename:  AST:2,ALT:2,ALKPHOS:2,BILITOT:2,PROT:2,ALBUMIN:2 in the last 72 hours No results found for this basename: LIPASE:2,AMYLASE:2 in the last 72 hours No results found for this basename: WBC:2,NEUTROABS:2,HGB:2,HCT:2,MCV:2,PLT:2 in the last 72 hours    Total time for discharge process including face to face time 39 minutes. Signed: Jimmie Rueter A. 01/11/2011, 8:15 AM

## 2011-03-08 ENCOUNTER — Ambulatory Visit: Payer: Medicare Other | Admitting: Oncology

## 2011-07-28 ENCOUNTER — Encounter (HOSPITAL_COMMUNITY): Payer: Self-pay | Admitting: *Deleted

## 2011-07-28 ENCOUNTER — Emergency Department (HOSPITAL_COMMUNITY): Payer: Medicare Other

## 2011-07-28 ENCOUNTER — Emergency Department (HOSPITAL_COMMUNITY)
Admission: EM | Admit: 2011-07-28 | Discharge: 2011-07-28 | Disposition: A | Discharge status: Home / Self Care | Payer: Medicare Other | Attending: Emergency Medicine | Admitting: Emergency Medicine

## 2011-07-28 DIAGNOSIS — Z7982 Long term (current) use of aspirin: Secondary | ICD-10-CM | POA: Insufficient documentation

## 2011-07-28 DIAGNOSIS — J4489 Other specified chronic obstructive pulmonary disease: Secondary | ICD-10-CM | POA: Insufficient documentation

## 2011-07-28 DIAGNOSIS — E785 Hyperlipidemia, unspecified: Secondary | ICD-10-CM | POA: Insufficient documentation

## 2011-07-28 DIAGNOSIS — I700 Atherosclerosis of aorta: Secondary | ICD-10-CM | POA: Insufficient documentation

## 2011-07-28 DIAGNOSIS — I517 Cardiomegaly: Secondary | ICD-10-CM | POA: Insufficient documentation

## 2011-07-28 DIAGNOSIS — M549 Dorsalgia, unspecified: Secondary | ICD-10-CM | POA: Insufficient documentation

## 2011-07-28 DIAGNOSIS — R110 Nausea: Secondary | ICD-10-CM | POA: Insufficient documentation

## 2011-07-28 DIAGNOSIS — M545 Low back pain, unspecified: Secondary | ICD-10-CM | POA: Insufficient documentation

## 2011-07-28 DIAGNOSIS — I2510 Atherosclerotic heart disease of native coronary artery without angina pectoris: Secondary | ICD-10-CM | POA: Insufficient documentation

## 2011-07-28 DIAGNOSIS — K449 Diaphragmatic hernia without obstruction or gangrene: Secondary | ICD-10-CM | POA: Insufficient documentation

## 2011-07-28 DIAGNOSIS — R0789 Other chest pain: Secondary | ICD-10-CM | POA: Insufficient documentation

## 2011-07-28 DIAGNOSIS — J984 Other disorders of lung: Secondary | ICD-10-CM | POA: Insufficient documentation

## 2011-07-28 DIAGNOSIS — Z79899 Other long term (current) drug therapy: Secondary | ICD-10-CM | POA: Insufficient documentation

## 2011-07-28 DIAGNOSIS — R072 Precordial pain: Secondary | ICD-10-CM | POA: Insufficient documentation

## 2011-07-28 DIAGNOSIS — R079 Chest pain, unspecified: Secondary | ICD-10-CM

## 2011-07-28 DIAGNOSIS — M810 Age-related osteoporosis without current pathological fracture: Secondary | ICD-10-CM | POA: Insufficient documentation

## 2011-07-28 DIAGNOSIS — J449 Chronic obstructive pulmonary disease, unspecified: Secondary | ICD-10-CM | POA: Insufficient documentation

## 2011-07-28 DIAGNOSIS — D472 Monoclonal gammopathy: Secondary | ICD-10-CM | POA: Insufficient documentation

## 2011-07-28 LAB — TROPONIN I
Troponin I: <0.3 ng/mL (ref <0.30)
Troponin I: <0.3 ng/mL (ref <0.30)

## 2011-07-28 LAB — BASIC METABOLIC PANEL
BUN: 10 mg/dL (ref 6–23)
CO2: 25 mEq/L (ref 19–32)
Calcium: 10 mg/dL (ref 8.4–10.5)
Chloride: 94 mEq/L — ABNORMAL LOW (ref 96–112)
Creatinine, Ser: 0.48 mg/dL — ABNORMAL LOW (ref 0.50–1.10)
GFR calc Af Amer: >90 mL/min (ref >90)
GFR calc non Af Amer: 88 mL/min — ABNORMAL LOW (ref >90)
Glucose, Bld: 84 mg/dL (ref 70–99)
Potassium: 4.1 mEq/L (ref 3.5–5.1)
Sodium: 128 mEq/L — ABNORMAL LOW (ref 135–145)

## 2011-07-28 LAB — CBC
HCT: 35 % — ABNORMAL LOW (ref 36.0–46.0)
Hemoglobin: 12.5 g/dL (ref 12.0–15.0)
MCH: 32.1 pg (ref 26.0–34.0)
MCHC: 35.7 g/dL (ref 30.0–36.0)
MCV: 90 fL (ref 78.0–100.0)
Platelets: 234 10*3/uL (ref 150–400)
RBC: 3.89 MIL/uL (ref 3.87–5.11)
RDW: 12.7 % (ref 11.5–15.5)
WBC: 9 10*3/uL (ref 4.0–10.5)

## 2011-07-28 LAB — LIPASE, BLOOD: Lipase: 57 U/L (ref 11–59)

## 2011-07-28 MED ORDER — ONDANSETRON HCL 4 MG/2ML IJ SOLN
4.0000 mg | Freq: Once | INTRAMUSCULAR | Status: AC
  Administered 2011-07-28: 4 mg via INTRAVENOUS
  Filled 2011-07-28: qty 2

## 2011-07-28 MED ORDER — IOHEXOL 350 MG/ML SOLN
100.0000 mL | Freq: Once | INTRAVENOUS | Status: AC | PRN
  Administered 2011-07-28: 100 mL via INTRAVENOUS

## 2011-07-28 MED ORDER — SODIUM CHLORIDE 0.9 % IV BOLUS (SEPSIS)
1000.0000 mL | Freq: Once | INTRAVENOUS | Status: AC
  Administered 2011-07-28: 1000 mL via INTRAVENOUS

## 2011-07-28 MED ORDER — MORPHINE SULFATE 4 MG/ML IJ SOLN
4.0000 mg | Freq: Once | INTRAMUSCULAR | Status: AC
  Administered 2011-07-28: 4 mg via INTRAVENOUS
  Filled 2011-07-28: qty 1

## 2011-07-28 MED ORDER — OXYCODONE-ACETAMINOPHEN 5-325 MG PO TABS: 1.0000 | ORAL_TABLET | ORAL | Status: AC | PRN

## 2011-07-28 MED ORDER — GI COCKTAIL ~~LOC~~
30.0000 mL | Freq: Once | ORAL | Status: AC
  Administered 2011-07-28: 30 mL via ORAL
  Filled 2011-07-28: qty 30

## 2011-07-28 NOTE — ED Notes (Signed)
Pt placed on bedside commode.  

## 2011-07-28 NOTE — ED Notes (Signed)
Pt c/o chest pain that started around 1030am . States pain is mid sternal and radiates around towards back. Also states pain is worse when taking a deep breathe. Reports takes 2 baby Asprin, 2 Tums, and Tramadol 4 hours ago.

## 2011-07-28 NOTE — ED Provider Notes (Signed)
History    78 year old female with chest pain. Onset this morning around 10:30 while at rest. Pain is in the substernal area with radiation to her back. Pain is worse with respiration and movement. Mild nausea but no vomiting. No palpitations. No shortness of breath. No cough, fever or chills. No unusual leg pain or swelling. Denies trauma. Pain improved since onset and almost completely resolved currently.  CSN: [id number]  Arrival date & time 07/28/11  1516   First MD Initiated Contact with Patient 07/28/11 1526      Chief Complaint  Patient presents with  . Chest Pain    (Consider location/radiation/quality/duration/timing/severity/associated sxs/prior treatment) HPI  Past Medical History  Diagnosis Date  . Peptic ulcer disease   . Osteoporosis   . Depression   . Anxiety   . Hyperlipemia   . IgG monoclonal gammopathy   . Compression fracture of L2 lumbar vertebra     Past Surgical History  Procedure Date  . Cervical polyp removal   . Dilation and curettage of uterus   . Tonsillectomy     No family history on file.  History  Substance Use Topics  . Smoking status: Current Everyday Smoker -- 1.0 packs/day    Types: Cigarettes  . Smokeless tobacco: Never Used  . Alcohol Use: No    OB History    Grav Para Term Preterm Abortions TAB SAB Ect Mult Living                  Review of Systems   Review of symptoms negative unless otherwise noted in HPI.   Allergies  Review of patient's allergies indicates no known allergies.  Home Medications   Current Outpatient Rx  Name Route Sig Dispense Refill  . ASPIRIN 81 MG PO CHEW Oral Chew 162 mg by mouth daily.    Marland Kitchen CALCIUM CARBONATE ANTACID 500 MG PO CHEW Oral Chew 2 tablets by mouth daily.    . IBUPROFEN 200 MG PO TABS Oral Take 200 mg by mouth every 6 (six) hours as needed. Pain.    Carma Leaven M PLUS PO TABS Oral Take 1 tablet by mouth daily.      Marland Kitchen POTASSIUM CHLORIDE CRYS ER 20 MEQ PO TBCR Oral Take 40 mEq by mouth  2 (two) times daily.    . TRAMADOL HCL 50 MG PO TABS Oral Take 50 mg by mouth every 8 (eight) hours as needed. Maximum dose= 8 tablets per day; for pain.       BP 117/56  Pulse 72  Temp 97.6 F (36.4 C) (Oral)  Resp 18  Wt 110 lb (49.896 kg)  SpO2 95%  Physical Exam  Nursing note and vitals reviewed. Constitutional: She appears well-developed and well-nourished. No distress.       Laying in bed. No acute distress.  HENT:  Head: Normocephalic and atraumatic.  Eyes: Conjunctivae are normal. Right eye exhibits no discharge. Left eye exhibits no discharge.  Neck: Neck supple.  Cardiovascular: Normal rate, regular rhythm and normal heart sounds.  Exam reveals no gallop and no friction rub.   No murmur heard. Pulmonary/Chest: Effort normal and breath sounds normal. No respiratory distress. She exhibits no tenderness.       Chest pain is not reproducible  Abdominal: Soft. She exhibits no distension. There is no tenderness.  Musculoskeletal: She exhibits no edema and no tenderness.       Lower extremities symmetric as compared to each other. No calf tenderness. Negative Homan's. No palpable cords.  Neurological: She is alert.  Skin: Skin is warm and dry. She is not diaphoretic.  Psychiatric: She has a normal mood and affect. Her behavior is normal. Thought content normal.    ED Course  Procedures (including critical care time)  Labs Reviewed  CBC - Abnormal; Notable for the following:    HCT 35.0 (*)     All other components within normal limits  BASIC METABOLIC PANEL - Abnormal; Notable for the following:    Sodium 128 (*)     Chloride 94 (*)     Creatinine, Ser 0.48 (*)     GFR calc non Af Amer 88 (*)     All other components within normal limits  TROPONIN I  LIPASE, BLOOD  TROPONIN I   Dg Chest 2 View  07/28/2011  *RADIOLOGY REPORT*  Clinical Data: Chest and low back pain  CHEST - 2 VIEW  Comparison: Chest x-ray of 01/05/2011  Findings: There is linear atelectasis at the  left lung base.  No active infiltrate or effusion is seen. The lungs remain somewhat hyperaerated.  Mild cardiomegaly is stable.  The bones are osteopenic.  IMPRESSION: Mild left basilar atelectasis.  Stable cardiomegaly.  No active lung disease.  Original Report Authenticated By: Juline Patch, M.D.   Ct Angio Chest W/cm &/or Wo Cm  07/28/2011  *RADIOLOGY REPORT*  Clinical Data:  Chest pain. Evaluate for dissection.  CT ANGIOGRAPHY CHEST, ABDOMEN AND PELVIS  Technique:  Multidetector CT imaging through the chest, abdomen and pelvis was performed using the standard protocol during bolus administration of intravenous contrast.  Multiplanar reconstructed images including MIPs were obtained and reviewed to evaluate the vascular anatomy.  Contrast: OMNIPAQUE IOHEXOL 350 MG/ML SOLN  Comparison:   None.  CTA CHEST  Findings:  Mild calcified plaque in the descending thoracic aorta and aortic arch.  No evidence of aneurysm or dissection.  No filling defects in the pulmonary arteries to suggest pulmonary emboli.  Heart is normal size. No mediastinal, hilar, or axillary adenopathy.  Mild COPD changes in the lungs.  Linear densities in the lingula, right middle lobe and both lower lobes in the lung bases, likely scarring.  No effusions.  There is coronary artery calcifications are present. Small hiatal hernia.   Review of the MIP images confirms the above findings.  IMPRESSION: No evidence of aortic aneurysm or dissection.  No evidence of pulmonary embolus.  Coronary artery calcifications.  Bibasilar scarring.  Small hiatal hernia.  CTA ABDOMEN AND PELVIS  Findings:  Aorta and iliac vessels are heavily calcified.  No aneurysm or dissection.  Celiac artery, superior mesenteric artery, single renal arteries are widely patent.  Suspect stenosis at the origin of the inferior mesenteric artery.  Liver, spleen, gallbladder, and kidneys are unremarkable.  Mild fullness of the adrenal glands bilaterally compatible with  hyperplasia.  Moderate stool burden throughout the colon.  Small bowel is decompressed.  Uterus, adnexa and urinary bladder are unremarkable.  No acute bony abnormality.   Review of the MIP images confirms the above findings.  IMPRESSION: No evidence of aneurysm or dissection.  No acute findings in the abdomen or pelvis.  Original Report Authenticated By: Cyndie Chime, M.D.   Ct Angio Abd/pel W/ And/or W/o  07/28/2011  *RADIOLOGY REPORT*  Clinical Data:  Chest pain. Evaluate for dissection.  CT ANGIOGRAPHY CHEST, ABDOMEN AND PELVIS  Technique:  Multidetector CT imaging through the chest, abdomen and pelvis was performed using the standard protocol during bolus administration of  intravenous contrast.  Multiplanar reconstructed images including MIPs were obtained and reviewed to evaluate the vascular anatomy.  Contrast: OMNIPAQUE IOHEXOL 350 MG/ML SOLN  Comparison:   None.  CTA CHEST  Findings:  Mild calcified plaque in the descending thoracic aorta and aortic arch.  No evidence of aneurysm or dissection.  No filling defects in the pulmonary arteries to suggest pulmonary emboli.  Heart is normal size. No mediastinal, hilar, or axillary adenopathy.  Mild COPD changes in the lungs.  Linear densities in the lingula, right middle lobe and both lower lobes in the lung bases, likely scarring.  No effusions.  There is coronary artery calcifications are present. Small hiatal hernia.   Review of the MIP images confirms the above findings.  IMPRESSION: No evidence of aortic aneurysm or dissection.  No evidence of pulmonary embolus.  Coronary artery calcifications.  Bibasilar scarring.  Small hiatal hernia.  CTA ABDOMEN AND PELVIS  Findings:  Aorta and iliac vessels are heavily calcified.  No aneurysm or dissection.  Celiac artery, superior mesenteric artery, single renal arteries are widely patent.  Suspect stenosis at the origin of the inferior mesenteric artery.  Liver, spleen, gallbladder, and kidneys are  unremarkable.  Mild fullness of the adrenal glands bilaterally compatible with hyperplasia.  Moderate stool burden throughout the colon.  Small bowel is decompressed.  Uterus, adnexa and urinary bladder are unremarkable.  No acute bony abnormality.   Review of the MIP images confirms the above findings.  IMPRESSION: No evidence of aneurysm or dissection.  No acute findings in the abdomen or pelvis.  Original Report Authenticated By: Cyndie Chime, M.D.   EKG:  Rhythm: Normal sinus rhythm Rate: 76 Axis: Normal Intervals: Normal ST segments: Normal   1. Chest pain       MDM  85yF with CP. EKG nonprovocative and repeat stable. Trop x2 normal with 2nd drawn about 8 hours after symptom onset. CT with no evidence of dissection, PE, pneumothorax or pneumonia. Is hyponatremic but this appears chronic per review of records. Pt is HD stable. No respiratory distress. Feel she is safe for Dc at this time. Return precautions discussed.        Raeford Razor, MD 08/04/11 1039

## 2011-07-28 NOTE — ED Notes (Signed)
Pt's daughter states "her c/p really started this morning @ 1030"; pt c/o c/p with movement of right arm and worse pain with a deep breath into back.

## 2011-07-28 NOTE — ED Notes (Signed)
Assisted pt to bedside commode. Pt having a lot of pain and nausea.  MD notified.

## 2011-08-10 ENCOUNTER — Encounter (HOSPITAL_COMMUNITY): Payer: Self-pay | Admitting: Emergency Medicine

## 2011-08-10 ENCOUNTER — Emergency Department (HOSPITAL_COMMUNITY): Payer: Medicare Other

## 2011-08-10 ENCOUNTER — Emergency Department (HOSPITAL_COMMUNITY)
Admission: EM | Admit: 2011-08-10 | Discharge: 2011-08-10 | Disposition: A | Discharge status: Home / Self Care | Payer: Medicare Other | Attending: Emergency Medicine | Admitting: Emergency Medicine

## 2011-08-10 DIAGNOSIS — F17200 Nicotine dependence, unspecified, uncomplicated: Secondary | ICD-10-CM | POA: Insufficient documentation

## 2011-08-10 DIAGNOSIS — E785 Hyperlipidemia, unspecified: Secondary | ICD-10-CM | POA: Insufficient documentation

## 2011-08-10 DIAGNOSIS — K5900 Constipation, unspecified: Secondary | ICD-10-CM | POA: Insufficient documentation

## 2011-08-10 DIAGNOSIS — Z7982 Long term (current) use of aspirin: Secondary | ICD-10-CM | POA: Insufficient documentation

## 2011-08-10 DIAGNOSIS — F341 Dysthymic disorder: Secondary | ICD-10-CM | POA: Insufficient documentation

## 2011-08-10 DIAGNOSIS — R109 Unspecified abdominal pain: Secondary | ICD-10-CM | POA: Insufficient documentation

## 2011-08-10 DIAGNOSIS — Z79899 Other long term (current) drug therapy: Secondary | ICD-10-CM | POA: Insufficient documentation

## 2011-08-10 LAB — COMPREHENSIVE METABOLIC PANEL
ALT: 6 U/L (ref 0–35)
AST: 17 U/L (ref 0–37)
Alkaline Phosphatase: 76 U/L (ref 39–117)
CO2: 23 mEq/L (ref 19–32)
Chloride: 98 mEq/L (ref 96–112)
Creatinine, Ser: 0.57 mg/dL (ref 0.50–1.10)
GFR calc non Af Amer: 83 mL/min — ABNORMAL LOW (ref >90)
Total Bilirubin: 0.3 mg/dL (ref 0.3–1.2)

## 2011-08-10 LAB — CBC WITH DIFFERENTIAL/PLATELET
Basophils Absolute: 0 10*3/uL (ref 0.0–0.1)
HCT: 36 % (ref 36.0–46.0)
Hemoglobin: 12.8 g/dL (ref 12.0–15.0)
Lymphocytes Relative: 29 % (ref 12–46)
Monocytes Absolute: 0.4 10*3/uL (ref 0.1–1.0)
Neutro Abs: 3.1 10*3/uL (ref 1.7–7.7)
RDW: 12.4 % (ref 11.5–15.5)
WBC: 5 10*3/uL (ref 4.0–10.5)

## 2011-08-10 LAB — POCT I-STAT TROPONIN I

## 2011-08-10 LAB — URINALYSIS, ROUTINE W REFLEX MICROSCOPIC
Glucose, UA: NEGATIVE mg/dL
Leukocytes, UA: NEGATIVE
Protein, ur: NEGATIVE mg/dL
Specific Gravity, Urine: 1.017 (ref 1.005–1.030)
pH: 6.5 (ref 5.0–8.0)

## 2011-08-10 MED ORDER — MAGNESIUM CITRATE PO SOLN: 148.0000 mL | Freq: Once | ORAL | Status: AC

## 2011-08-10 MED ORDER — POLYETHYLENE GLYCOL 3350 17 GM/SCOOP PO POWD: 17.0000 g | Freq: Every day | ORAL | Status: AC

## 2011-08-10 NOTE — ED Notes (Signed)
Pt brought in by EMS c/o backpain hurting between the shoulders.was seen here on 7/4/20013 for same c/o

## 2011-08-10 NOTE — ED Provider Notes (Signed)
History     CSN: [id number]  Arrival date & time 08/10/11  1550   First MD Initiated Contact with Patient 08/10/11 1606      Chief Complaint  Patient presents with  . Back Pain   level V caveat due to dementia (Consider location/radiation/quality/duration/timing/severity/associated sxs/prior treatment) Patient is a 78 y.o. female presenting with back pain. The history is provided by the patient.  Back Pain  This is a recurrent problem.   patient was seen in the ER 13 days ago for chest back abdominal pain. She's back continued pain. The patient has some dementia. Her husband states that she now has some abdominal pain there were at about appendicitis. No fevers. Patient states the pain has been constant.  Past Medical History  Diagnosis Date  . Peptic ulcer disease   . Osteoporosis   . Depression   . Anxiety   . Hyperlipemia   . IgG monoclonal gammopathy   . Compression fracture of L2 lumbar vertebra     Past Surgical History  Procedure Date  . Cervical polyp removal   . Dilation and curettage of uterus   . Tonsillectomy     No family history on file.  History  Substance Use Topics  . Smoking status: Current Everyday Smoker -- 1.0 packs/day    Types: Cigarettes  . Smokeless tobacco: Never Used  . Alcohol Use: No    OB History    Grav Para Term Preterm Abortions TAB SAB Ect Mult Living                  Review of Systems  Unable to perform ROS Musculoskeletal: Positive for back pain.    Allergies  Review of patient's allergies indicates no known allergies.  Home Medications   Current Outpatient Rx  Name Route Sig Dispense Refill  . ASPIRIN 81 MG PO CHEW Oral Chew 162 mg by mouth daily.    Marland Kitchen CALCIUM CARBONATE ANTACID 500 MG PO CHEW Oral Chew 2 tablets by mouth daily.    . IBUPROFEN 200 MG PO TABS Oral Take 400 mg by mouth every 8 (eight) hours as needed. Pain.    Carma Leaven M PLUS PO TABS Oral Take 1 tablet by mouth daily.      Marland Kitchen POTASSIUM CHLORIDE CRYS  ER 20 MEQ PO TBCR Oral Take 40 mEq by mouth 2 (two) times daily.    . TRAMADOL HCL 50 MG PO TABS Oral Take 50 mg by mouth every 8 (eight) hours as needed. Maximum dose= 8 tablets per day; for pain.     Marland Kitchen MAGNESIUM CITRATE PO SOLN Oral Take 148 mLs by mouth once. May repeat the next day if no results 300 mL 0  . POLYETHYLENE GLYCOL 3350 PO POWD Oral Take 17 g by mouth daily. 255 g 0    BP 114/64  Pulse 72  Temp 98.6 F (37 C) (Oral)  Resp 20  SpO2 95%  Physical Exam  Constitutional: She appears well-developed and well-nourished.  Eyes: Pupils are equal, round, and reactive to light.  Neck: Neck supple.  Cardiovascular: Normal rate.   Pulmonary/Chest: Effort normal.  Abdominal: Soft. There is tenderness.       Is mild tenderness to right upper abdomen without rebound or guarding.  Musculoskeletal: Normal range of motion.  Neurological: She is alert.       Mild confusion   Skin: Skin is warm.    ED Course  Procedures (including critical care time)  Labs Reviewed  URINALYSIS,  ROUTINE W REFLEX MICROSCOPIC - Abnormal; Notable for the following:    APPearance CLOUDY (*)     Ketones, ur 40 (*)     All other components within normal limits  COMPREHENSIVE METABOLIC PANEL - Abnormal; Notable for the following:    Sodium 132 (*)     GFR calc non Af Amer 83 (*)     All other components within normal limits  CBC WITH DIFFERENTIAL  POCT I-STAT TROPONIN I   Dg Abd Acute W/chest  08/10/2011  *RADIOLOGY REPORT*  Clinical Data: Abdominal and chest pain, nausea, vomiting.  ACUTE ABDOMEN SERIES (ABDOMEN 2 VIEW & CHEST 1 VIEW)  Comparison: 07/28/2011 and earlier studies  Findings: Stable linear scarring or subsegmental atelectasis in the lung bases, left greater than right.  Heart size normal.  No effusion or focal infiltrate.  No free air on the erect film. Scattered gas filled nondilated small bowel loops in the mid abdomen.  Moderate fecal material in the proximal colon and rectum. Left  pelvic phleboliths.  Regional bones unremarkable. Atheromatous aorta.  IMPRESSION:  1.  Nonobstructive bowel gas pattern with moderate colonic fecal material. 2.  No acute cardiopulmonary disease.  Original Report Authenticated By: Thora Lance III, M.D.     1. Abdominal pain   2. Constipation     Date: 08/10/2011  Rate: 68  Rhythm: normal sinus rhythm  QRS Axis: normal  Intervals: normal  ST/T Wave abnormalities: normal  Conduction Disutrbances:none  Narrative Interpretation:   Old EKG Reviewed: unchanged      MDM   Patient with abdominal pain and pain to her back. Recently seen for the same and had negative CT angiogram at that time. EKG is reassuring. Enzymes are negative. Rather benign exam. X-ray shows nonobstructive bowel gas pattern with moderate colonic stool burden. Initial be discharged with treatment for constipation.       Juliet Rude. Rubin Payor, MD 08/10/11 1924

## 2011-08-10 NOTE — ED Notes (Signed)
YNW:[id number]<[id number]> Expected date:<BR> Expected time: 3:30 PM<BR> Means of arrival:Ambulance<BR> Comments:<BR> Back, body pain, recent d/c

## 2012-10-15 ENCOUNTER — Encounter (HOSPITAL_COMMUNITY): Payer: Self-pay | Admitting: Emergency Medicine

## 2012-10-15 ENCOUNTER — Emergency Department (HOSPITAL_COMMUNITY)
Admission: EM | Admit: 2012-10-15 | Discharge: 2012-10-15 | Disposition: A | Discharge status: Home / Self Care | Payer: Medicare Other | Attending: Emergency Medicine | Admitting: Emergency Medicine

## 2012-10-15 ENCOUNTER — Emergency Department (HOSPITAL_COMMUNITY): Payer: Medicare Other

## 2012-10-15 DIAGNOSIS — Y92009 Unspecified place in unspecified non-institutional (private) residence as the place of occurrence of the external cause: Secondary | ICD-10-CM | POA: Insufficient documentation

## 2012-10-15 DIAGNOSIS — F329 Major depressive disorder, single episode, unspecified: Secondary | ICD-10-CM | POA: Insufficient documentation

## 2012-10-15 DIAGNOSIS — Z8639 Personal history of other endocrine, nutritional and metabolic disease: Secondary | ICD-10-CM | POA: Insufficient documentation

## 2012-10-15 DIAGNOSIS — Z7982 Long term (current) use of aspirin: Secondary | ICD-10-CM | POA: Insufficient documentation

## 2012-10-15 DIAGNOSIS — Z79899 Other long term (current) drug therapy: Secondary | ICD-10-CM | POA: Insufficient documentation

## 2012-10-15 DIAGNOSIS — F411 Generalized anxiety disorder: Secondary | ICD-10-CM | POA: Insufficient documentation

## 2012-10-15 DIAGNOSIS — M810 Age-related osteoporosis without current pathological fracture: Secondary | ICD-10-CM | POA: Insufficient documentation

## 2012-10-15 DIAGNOSIS — Z87891 Personal history of nicotine dependence: Secondary | ICD-10-CM | POA: Insufficient documentation

## 2012-10-15 DIAGNOSIS — Z862 Personal history of diseases of the blood and blood-forming organs and certain disorders involving the immune mechanism: Secondary | ICD-10-CM | POA: Insufficient documentation

## 2012-10-15 DIAGNOSIS — S42213A Unspecified displaced fracture of surgical neck of unspecified humerus, initial encounter for closed fracture: Secondary | ICD-10-CM | POA: Insufficient documentation

## 2012-10-15 DIAGNOSIS — F3289 Other specified depressive episodes: Secondary | ICD-10-CM | POA: Insufficient documentation

## 2012-10-15 DIAGNOSIS — S42302A Unspecified fracture of shaft of humerus, left arm, initial encounter for closed fracture: Secondary | ICD-10-CM

## 2012-10-15 DIAGNOSIS — Z8711 Personal history of peptic ulcer disease: Secondary | ICD-10-CM | POA: Insufficient documentation

## 2012-10-15 DIAGNOSIS — Y939 Activity, unspecified: Secondary | ICD-10-CM | POA: Insufficient documentation

## 2012-10-15 DIAGNOSIS — R296 Repeated falls: Secondary | ICD-10-CM | POA: Insufficient documentation

## 2012-10-15 MED ORDER — HYDROCODONE-ACETAMINOPHEN 5-325 MG PO TABS: 1.0000 | ORAL_TABLET | Freq: Three times a day (TID) | ORAL | Status: DC | PRN

## 2012-10-15 NOTE — ED Notes (Signed)
Shoulder immobilizer placed on patient by Chanrorn Sok NT.

## 2012-10-15 NOTE — ED Notes (Signed)
Pt fell at home several days ago  EMS came out and did not transport pt at that time  Pt had an xray Monday a week ago and today they were told to bring her to the hospital because the xray showed a fracture  Pt is wearing a sling to her left arm

## 2012-10-15 NOTE — ED Notes (Signed)
Patient transported to X-ray 

## 2012-10-15 NOTE — Discharge Instructions (Signed)
Humerus Fracture, Treated with Immobilization  The humerus is the large bone in your upper arm. You have a broken (fractured) humerus. These fractures are easily diagnosed with X-rays.  TREATMENT   Simple fractures which will heal without disability are treated with simple immobilization. Immobilization means you will wear a cast, splint, or sling. You have a fracture which will do well with immobilization. The fracture will heal well simply by being held in a good position until it is stable enough to begin range of motion exercises. Do not take part in activities which would further injure your arm.   HOME CARE INSTRUCTIONS    Put ice on the injured area.   Put ice in a plastic bag.   Place a towel between your skin and the bag.   Leave the ice on for 15-20 minutes, 3-4 times a day.   If you have a cast:   Do not scratch the skin under the cast using sharp or pointed objects.   Check the skin around the cast every day. You may put lotion on any red or sore areas.   Keep your cast dry and clean.   If you have a splint:   Wear the splint as directed.   Keep your splint dry and clean.   You may loosen the elastic around the splint if your fingers become numb, tingle, or turn cold or blue.   If you have a sling:   Wear the sling as directed.   Do not put pressure on any part of your cast or splint until it is fully hardened.   Your cast or splint can be protected during bathing with a plastic bag. Do not lower the cast or splint into water.   Only take over-the-counter or prescription medicines for pain, discomfort, or fever as directed by your caregiver.   Do range of motion exercises as instructed by your caregiver.   Follow up as directed by your caregiver. This is very important in order to avoid permanent injury or disability and chronic pain.  SEEK IMMEDIATE MEDICAL CARE IF:    Your skin or nails in the injured arm turn blue or gray.   Your arm feels cold or numb.   You develop severe pain  in the injured arm.   You are having problems with the medicines you were given.  MAKE SURE YOU:    Understand these instructions.   Will watch your condition.   Will get help right away if you are not doing well or get worse.  Document Released: 04/18/2000 Document Revised: 04/04/2011 Document Reviewed: 02/24/2010  [employer] Patient Information 2014 [employer]

## 2012-10-16 NOTE — ED Provider Notes (Signed)
CSN: [id number]     Arrival date & time 10/15/12  1840 History   First MD Initiated Contact with Patient 10/15/12 2003     Chief Complaint  Patient presents with  . Arm Pain   (Consider location/radiation/quality/duration/timing/severity/associated sxs/prior Treatment) HPI Comments: Pt comes in with cc of fall. Pt had a fall about a week ago, had home health come in and do a Xray, which showed fracture, and patient was asked to come to the ED. Pt has pain to the left side, arm. Already has a sling. Pain is moderate. She has no f/u.  Patient is a 79 y.o. female presenting with arm pain. The history is provided by the patient.  Arm Pain Pertinent negatives include no chest pain, no abdominal pain and no headaches.    Past Medical History  Diagnosis Date  . Peptic ulcer disease   . Osteoporosis   . Depression   . Anxiety   . Hyperlipemia   . IgG monoclonal gammopathy   . Compression fracture of L2 lumbar vertebra    Past Surgical History  Procedure Laterality Date  . Cervical polyp removal    . Dilation and curettage of uterus    . Tonsillectomy     History reviewed. No pertinent family history. History  Substance Use Topics  . Smoking status: Former Smoker -- 1.00 packs/day  . Smokeless tobacco: Never Used  . Alcohol Use: No   OB History   Grav Para Term Preterm Abortions TAB SAB Ect Mult Living                 Review of Systems  Constitutional: Positive for activity change.  Cardiovascular: Negative for chest pain.  Gastrointestinal: Negative for nausea, vomiting and abdominal pain.  Musculoskeletal: Positive for arthralgias.  Neurological: Negative for headaches.    Allergies  Review of patient's allergies indicates no known allergies.  Home Medications   Current Outpatient Rx  Name  Route  Sig  Dispense  Refill  . aspirin 81 MG chewable tablet   Oral   Chew 162 mg by mouth daily.         . calcium carbonate (TUMS - DOSED IN MG ELEMENTAL CALCIUM) 500  MG chewable tablet   Oral   Chew 2 tablets by mouth daily.         . Cranberry 250 MG TABS   Oral   Take 1 tablet by mouth daily.         Marland Kitchen donepezil (ARICEPT) 5 MG tablet   Oral   Take 5 mg by mouth daily.          . fentaNYL (DURAGESIC - DOSED MCG/HR) 25 MCG/HR patch   Transdermal   Place 1 patch onto the skin every 3 (three) days.          Marland Kitchen HYDROcodone-acetaminophen (NORCO/VICODIN) 5-325 MG per tablet   Oral   Take 1 tablet by mouth every 4 (four) hours as needed for pain.          Marland Kitchen ibuprofen (ADVIL,MOTRIN) 200 MG tablet   Oral   Take 400 mg by mouth every 8 (eight) hours as needed. Pain.         . Multiple Vitamins-Minerals (MULTIVITAMINS THER. W/MINERALS) TABS   Oral   Take 1 tablet by mouth daily.           . ondansetron (ZOFRAN) 4 MG tablet   Oral   Take 4 mg by mouth every 8 (eight) hours as needed.          Marland Kitchen  potassium chloride SA (K-DUR,KLOR-CON) 20 MEQ tablet   Oral   Take 40 mEq by mouth 2 (two) times daily.         . sertraline (ZOLOFT) 25 MG tablet   Oral   Take 25 mg by mouth daily.          . traMADol (ULTRAM) 50 MG tablet   Oral   Take 50 mg by mouth every 8 (eight) hours as needed. Maximum dose= 8 tablets per day; for pain.          Marland Kitchen HYDROcodone-acetaminophen (NORCO/VICODIN) 5-325 MG per tablet   Oral   Take 1 tablet by mouth every 8 (eight) hours as needed for pain.   8 tablet   0    BP 102/54  Pulse 65  Temp(Src) 97.5 F (36.4 C) (Oral)  Resp 16  SpO2 95% Physical Exam  Vitals reviewed. Constitutional: She appears well-developed.  HENT:  Head: Normocephalic.  Eyes: Conjunctivae are normal.  Neck: Normal range of motion. Neck supple.  Cardiovascular: Normal rate.   Pulmonary/Chest: Effort normal and breath sounds normal.  Abdominal: Soft. There is no tenderness.  Musculoskeletal:  Left arm tenderness, with some ecchymoses. Elbow is intact, no deformity of the clavicle.    ED Course  Procedures  (including critical care time) Labs Review Labs Reviewed - No data to display Imaging Review Dg Shoulder Left  10/15/2012   CLINICAL DATA:  Fall 2 days ago. Left shoulder pain radiating to the elbow.  EXAM: LEFT SHOULDER - 2+ VIEW  COMPARISON:  None.  FINDINGS: Technically suboptimal examination due to inability to adequately position the patient. Frontal and scapular Y-view are submitted for interpretation. The left clavicle appears intact. Scapula appears intact. Moderate AC joint osteoarthritis is present.  The proximal left humerus demonstrates a nondisplaced fracture through the surgical neck. Minimal cortical step-off deformity is evident on the frontal view. Suspect hemarthrosis based on distention of the axillary pouch.  IMPRESSION: Nondisplaced proximal left humerus fracture through the surgical neck. CT may be useful for further evaluation.   Electronically Signed   By: Andreas Newport M.D.   On: 10/15/2012 21:11   Dg Humerus Left  10/15/2012   CLINICAL DATA:  Fall. Left arm pain.  EXAM: LEFT HUMERUS - 2+ VIEW  COMPARISON:  Shoulder radiographs today.  FINDINGS: Nondisplaced proximal left humerus fracture is present extending through the surgical neck. Distal humerus appears intact. Mild to moderate AC joint osteoarthritis on the left.  IMPRESSION: Nondisplaced proximal left humerus fracture. Distal humerus intact.   Electronically Signed   By: Andreas Newport M.D.   On: 10/15/2012 21:12    MDM   1. Humerus fracture, left, closed, initial encounter    Pt comes in with cc of fall, humeral fracture. Xray reveals the non displaced fracture - ortho follow up provided.  Derwood Kaplan, MD 10/16/12 1702

## 2016-06-17 ENCOUNTER — Other Ambulatory Visit: Payer: Self-pay

## 2016-06-17 ENCOUNTER — Observation Stay (HOSPITAL_BASED_OUTPATIENT_CLINIC_OR_DEPARTMENT_OTHER): Payer: Medicare Other

## 2016-06-17 ENCOUNTER — Observation Stay (HOSPITAL_COMMUNITY)
Admission: EM | Admit: 2016-06-17 | Discharge: 2016-06-19 | Disposition: A | Discharge status: Nursing Home (Includes ICF) | Payer: Medicare Other | Attending: Nephrology | Admitting: Nephrology

## 2016-06-17 ENCOUNTER — Emergency Department (HOSPITAL_COMMUNITY): Payer: Medicare Other

## 2016-06-17 ENCOUNTER — Encounter (HOSPITAL_COMMUNITY): Payer: Self-pay | Admitting: Emergency Medicine

## 2016-06-17 DIAGNOSIS — Z9889 Other specified postprocedural states: Secondary | ICD-10-CM | POA: Diagnosis not present

## 2016-06-17 DIAGNOSIS — M810 Age-related osteoporosis without current pathological fracture: Secondary | ICD-10-CM | POA: Insufficient documentation

## 2016-06-17 DIAGNOSIS — F0390 Unspecified dementia without behavioral disturbance: Secondary | ICD-10-CM | POA: Diagnosis not present

## 2016-06-17 DIAGNOSIS — I700 Atherosclerosis of aorta: Secondary | ICD-10-CM | POA: Diagnosis not present

## 2016-06-17 DIAGNOSIS — T50901A Poisoning by unspecified drugs, medicaments and biological substances, accidental (unintentional), initial encounter: Secondary | ICD-10-CM | POA: Diagnosis not present

## 2016-06-17 DIAGNOSIS — Z87891 Personal history of nicotine dependence: Secondary | ICD-10-CM | POA: Diagnosis not present

## 2016-06-17 DIAGNOSIS — R0789 Other chest pain: Secondary | ICD-10-CM | POA: Diagnosis not present

## 2016-06-17 DIAGNOSIS — R079 Chest pain, unspecified: Secondary | ICD-10-CM | POA: Diagnosis present

## 2016-06-17 DIAGNOSIS — F419 Anxiety disorder, unspecified: Secondary | ICD-10-CM | POA: Insufficient documentation

## 2016-06-17 DIAGNOSIS — G8929 Other chronic pain: Secondary | ICD-10-CM | POA: Insufficient documentation

## 2016-06-17 DIAGNOSIS — M7989 Other specified soft tissue disorders: Secondary | ICD-10-CM | POA: Diagnosis not present

## 2016-06-17 DIAGNOSIS — F329 Major depressive disorder, single episode, unspecified: Secondary | ICD-10-CM | POA: Diagnosis not present

## 2016-06-17 DIAGNOSIS — I209 Angina pectoris, unspecified: Secondary | ICD-10-CM

## 2016-06-17 DIAGNOSIS — E871 Hypo-osmolality and hyponatremia: Secondary | ICD-10-CM | POA: Diagnosis not present

## 2016-06-17 DIAGNOSIS — E785 Hyperlipidemia, unspecified: Secondary | ICD-10-CM | POA: Diagnosis not present

## 2016-06-17 DIAGNOSIS — Z7982 Long term (current) use of aspirin: Secondary | ICD-10-CM | POA: Insufficient documentation

## 2016-06-17 HISTORY — DX: Unspecified dementia, unspecified severity, without behavioral disturbance, psychotic disturbance, mood disturbance, and anxiety: F03.90

## 2016-06-17 LAB — COMPREHENSIVE METABOLIC PANEL
ALBUMIN: 3.2 g/dL — AB (ref 3.5–5.0)
ALK PHOS: 52 U/L (ref 38–126)
ALT: 11 U/L — ABNORMAL LOW (ref 14–54)
ANION GAP: 8 (ref 5–15)
AST: 22 U/L (ref 15–41)
BILIRUBIN TOTAL: 0.8 mg/dL (ref 0.3–1.2)
BUN: 14 mg/dL (ref 6–20)
CALCIUM: 9 mg/dL (ref 8.9–10.3)
CO2: 21 mmol/L — ABNORMAL LOW (ref 22–32)
Chloride: 104 mmol/L (ref 101–111)
Creatinine, Ser: 0.75 mg/dL (ref 0.44–1.00)
GFR calc non Af Amer: >60 mL/min (ref >60)
GLUCOSE: 97 mg/dL (ref 65–99)
Potassium: 3.8 mmol/L (ref 3.5–5.1)
Sodium: 133 mmol/L — ABNORMAL LOW (ref 135–145)
Total Protein: 6.4 g/dL — ABNORMAL LOW (ref 6.5–8.1)

## 2016-06-17 LAB — APTT: APTT: 31 s (ref 24–36)

## 2016-06-17 LAB — CBC
HEMATOCRIT: 32.3 % — AB (ref 36.0–46.0)
HEMOGLOBIN: 10.8 g/dL — AB (ref 12.0–15.0)
MCH: 31.3 pg (ref 26.0–34.0)
MCHC: 33.4 g/dL (ref 30.0–36.0)
MCV: 93.6 fL (ref 78.0–100.0)
Platelets: 249 10*3/uL (ref 150–400)
RBC: 3.45 MIL/uL — ABNORMAL LOW (ref 3.87–5.11)
RDW: 14.9 % (ref 11.5–15.5)
WBC: 7.5 10*3/uL (ref 4.0–10.5)

## 2016-06-17 LAB — PROTIME-INR
INR: 1
Prothrombin Time: 13.2 seconds (ref 11.4–15.2)

## 2016-06-17 LAB — TROPONIN I

## 2016-06-17 LAB — D-DIMER, QUANTITATIVE: D-Dimer, Quant: 0.42 ug/mL-FEU (ref 0.00–0.50)

## 2016-06-17 LAB — I-STAT TROPONIN, ED
Troponin i, poc: 0.01 ng/mL (ref 0.00–0.08)
Troponin i, poc: 0 ng/mL (ref 0.00–0.08)

## 2016-06-17 LAB — MRSA PCR SCREENING: MRSA by PCR: NEGATIVE

## 2016-06-17 MED ORDER — FENTANYL 25 MCG/HR TD PT72
25.0000 ug | MEDICATED_PATCH | TRANSDERMAL | Status: DC
  Filled 2016-06-17: qty 1

## 2016-06-17 MED ORDER — HYDROCODONE-ACETAMINOPHEN 5-325 MG PO TABS
1.0000 | ORAL_TABLET | Freq: Three times a day (TID) | ORAL | Status: DC | PRN
  Administered 2016-06-18: 1 via ORAL
  Filled 2016-06-17 (×2): qty 1

## 2016-06-17 MED ORDER — GI COCKTAIL ~~LOC~~: 30.0000 mL | Freq: Four times a day (QID) | ORAL | Status: DC | PRN

## 2016-06-17 MED ORDER — MORPHINE SULFATE (PF) 4 MG/ML IV SOLN: 1.0000 mg | INTRAVENOUS | Status: DC | PRN

## 2016-06-17 MED ORDER — PANTOPRAZOLE SODIUM 40 MG PO TBEC
40.0000 mg | DELAYED_RELEASE_TABLET | Freq: Two times a day (BID) | ORAL | Status: DC
  Administered 2016-06-17 (×2): 40 mg via ORAL
  Filled 2016-06-17 (×2): qty 1

## 2016-06-17 MED ORDER — SODIUM CHLORIDE 0.9 % IV SOLN
INTRAVENOUS | Status: DC
  Administered 2016-06-17 (×2): via INTRAVENOUS

## 2016-06-17 MED ORDER — ENOXAPARIN SODIUM 40 MG/0.4ML ~~LOC~~ SOLN
40.0000 mg | SUBCUTANEOUS | Status: DC
  Administered 2016-06-17 – 2016-06-19 (×3): 40 mg via SUBCUTANEOUS
  Filled 2016-06-17 (×4): qty 0.4

## 2016-06-17 MED ORDER — ACETAMINOPHEN 325 MG PO TABS: 650.0000 mg | ORAL_TABLET | ORAL | Status: DC | PRN

## 2016-06-17 MED ORDER — ASPIRIN 81 MG PO CHEW: 324.0000 mg | CHEWABLE_TABLET | Freq: Once | ORAL | Status: DC

## 2016-06-17 MED ORDER — DONEPEZIL HCL 5 MG PO TABS
5.0000 mg | ORAL_TABLET | Freq: Every day | ORAL | Status: DC
  Administered 2016-06-17 – 2016-06-19 (×3): 5 mg via ORAL
  Filled 2016-06-17 (×3): qty 1

## 2016-06-17 MED ORDER — PANTOPRAZOLE SODIUM 40 MG PO TBEC
40.0000 mg | DELAYED_RELEASE_TABLET | Freq: Two times a day (BID) | ORAL | Status: DC
  Administered 2016-06-18 – 2016-06-19 (×4): 40 mg via ORAL
  Filled 2016-06-17 (×4): qty 1

## 2016-06-17 MED ORDER — SERTRALINE HCL 25 MG PO TABS
25.0000 mg | ORAL_TABLET | Freq: Every day | ORAL | Status: DC
  Filled 2016-06-17: qty 1

## 2016-06-17 MED ORDER — ONDANSETRON HCL 4 MG/2ML IJ SOLN: 4.0000 mg | Freq: Four times a day (QID) | INTRAMUSCULAR | Status: DC | PRN

## 2016-06-17 NOTE — Progress Notes (Signed)
Patient was seen and examined at bedside. She was admitted earlier today. Please see H&P for detail. She was admitted from nursing facility for the evaluation of chest pain. Troponin and EKG unremarkable. Patient denied chest pain today. -Continue current management. Waiting echocardiogram and Doppler study. -Labs unremarkable -Chest x-ray with possible bronchitis with no sign of pneumonia. -D-dimer not elevated -Hyponatremia stable -Continue current medical and supportive treatment. Currently on aspirin, Protonix. -Social worker referral for safe discharge planning.  Blood pressure 144/61, heart rate 72, oxygen saturation 96% room air Elderly female lying on bed comfortable, not in distress Lungs clear bilateral Regular rate rhythm, S1-S2 normal No lower extremity edema  DVT prophylaxis with Lovenox subcutaneous

## 2016-06-17 NOTE — ED Notes (Signed)
Pt's family at the bedside, stated that pt's facility gave her fossamax x 4 days in a row instead of once a month. EDP aware.

## 2016-06-17 NOTE — ED Provider Notes (Signed)
[facility] DEPT Provider Note   CSN: [id number] Arrival date & time: 06/17/16  0006 By signing my name below, I, Janina Mayo, attest that this documentation has been prepared under the direction and in the presence of Varney Biles, MD . Electronically Signed: Janina Mayo, Scribe. 06/17/2016. 12:30 AM.  History   Chief Complaint Chief Complaint  Patient presents with  . Chest Pain    The history is provided by the patient. No language interpreter was used.   HPI Comments:  Dana Bush is a 83 y.o. female who presents to the Emergency Department complaining of intermittent, progressively improving central chest pain with radiation to her back which began last night while laying in bed. She reports that the pain lasted a few minutes before it went away. Pt reports associated symptoms of SOB and generalized weakness which began this afternoon. Chest pain was radiating to the back. Pt currently lives in a retirement home. She denies any history of chest pain, MI, DM, DVT or PE. Pt denies nausea, vomiting, fever, cough, diaphoresis,  abdominal pain, dysuria, generalized body aches, hematuria or rash.  Past Medical History:  Diagnosis Date  . Anxiety   . Compression fracture of L2 lumbar vertebra (HCC)   . Dementia   . Depression   . Hyperlipemia   . IgG monoclonal gammopathy   . Osteoporosis   . Peptic ulcer disease     Patient Active Problem List   Diagnosis Date Noted  . Chest pain 06/17/2016  . Left leg swelling 06/17/2016  . Drug overdose, accidental or unintentional, initial encounter 06/17/2016  . Malnutrition of mild degree ([place]) 01/09/2011  . Dizziness 01/09/2011  . Compression fracture of L1 lumbar vertebra ([place]) 01/05/2011  . Fall 01/05/2011  . Hyponatremia 01/05/2011  . Osteoporosis 01/05/2011    Past Surgical History:  Procedure Laterality Date  . cervical polyp removal    . DILATION AND CURETTAGE OF UTERUS    . TONSILLECTOMY      OB  History    No data available       Home Medications    Prior to Admission medications   Medication Sig Start Date End Date Taking? Authorizing Provider  alendronate (FOSAMAX) 70 MG tablet Take 70 mg by mouth once a week. Take with a full glass of water on an empty stomach.   Yes [provider]  aspirin-acetaminophen-caffeine (EXCEDRIN MIGRAINE) [id number] MG tablet Take 1-2 tablets by mouth every 6 (six) hours as needed for headache.   Yes [provider]  Calcium Carb-Cholecalciferol 600-400 MG-UNIT TABS Take 1 tablet by mouth 2 (two) times daily.   Yes [provider]  clonazePAM (KLONOPIN) 0.25 MG disintegrating tablet Take 0.25 mg by mouth 2 (two) times daily as needed (anxiety).   Yes [provider]  donepezil (ARICEPT) 5 MG tablet Take 10 mg by mouth daily.  09/17/12  Yes [provider]  HYDROcodone-acetaminophen (NORCO/VICODIN) 5-325 MG per tablet Take 1 tablet by mouth every 8 (eight) hours as needed for pain. 10/15/12  Yes Varney Biles, MD  HYDROcodone-acetaminophen (NORCO/VICODIN) 5-325 MG tablet Take 1 tablet by mouth 4 (four) times daily.   Yes [provider]    Family History No family history on file.  Social History Social History  Substance Use Topics  . Smoking status: Former Smoker    Packs/day: 1.00  . Smokeless tobacco: Never Used  . Alcohol use No     Allergies   Patient has no known allergies.   Review  of Systems Review of Systems  Constitutional: Negative for diaphoresis and fever.  Respiratory: Positive for shortness of breath. Negative for cough.   Cardiovascular: Positive for chest pain.  Gastrointestinal: Negative for abdominal pain, nausea and vomiting.  Genitourinary: Negative for dysuria and hematuria.  Musculoskeletal: Negative for myalgias.  Neurological: Positive for weakness.  All other systems reviewed and are negative.  Physical Exam Updated Vital Signs BP (!) 141/68    Pulse 66   Temp 97.6 F (36.4 C)   Resp 18   Ht 5\' 2"  (1.575 m)   Wt 55.1 kg (121 lb 6.4 oz)   SpO2 96%   BMI 22.20 kg/m   Physical Exam  Constitutional: She is oriented to person, place, and time. She appears well-developed and well-nourished. No distress.  HENT:  Head: Normocephalic and atraumatic.  Eyes: Conjunctivae are normal.  Neck: No JVD present.  Cardiovascular: Normal rate and regular rhythm.   Pulmonary/Chest: Effort normal. She has rales.  Right sided rales 95% of O2 saturation   Abdominal: She exhibits no distension.  Musculoskeletal: She exhibits edema and tenderness.  LLE with unilateral calf swelling. +left calf tenderness  Neurological: She is alert and oriented to person, place, and time.  Skin: Skin is warm and dry.  Nursing note and vitals reviewed.   ED Treatments / Results  DIAGNOSTIC STUDIES:  Oxygen Saturation is 93% on room air, low by my interpretation.    COORDINATION OF CARE:  12:45 AM Discussed treatment plan with pt at bedside and pt agreed to plan.  Labs (all labs ordered are listed, but only abnormal results are displayed) Labs Reviewed  CBC - Abnormal; Notable for the following:       Result Value   RBC 3.45 (*)    Hemoglobin 10.8 (*)    HCT 32.3 (*)    All other components within normal limits  COMPREHENSIVE METABOLIC PANEL - Abnormal; Notable for the following:    Sodium 133 (*)    CO2 21 (*)    Total Protein 6.4 (*)    Albumin 3.2 (*)    ALT 11 (*)    All other components within normal limits  MRSA PCR SCREENING  PROTIME-INR  APTT  D-DIMER, QUANTITATIVE (NOT AT [facility])  TROPONIN I  TROPONIN I  TROPONIN I  I-STAT TROPOININ, ED  I-STAT TROPOININ, ED    EKG  EKG Interpretation  Date/Time:  Friday Jun 17 2016 02:55:17 EDT Ventricular Rate:  67 PR Interval:    QRS Duration: 86 QT Interval:  395 QTC Calculation: 417 R Axis:   61 Text Interpretation:  Sinus rhythm No acute changes No significant change since last  tracing Confirmed by Varney Biles [phone]) on 06/17/2016 3:24:14 AM       Radiology Dg Chest 2 View  Result Date: 06/17/2016 CLINICAL DATA:  Initial evaluation for acute chest pain, right-sided rales. EXAM: CHEST  2 VIEW COMPARISON:  Prior radiograph and CT from 07/28/2011. FINDINGS: Transverse heart size at the upper limits of normal, stable. Mediastinal silhouette within normal limits. Aortic atherosclerosis. Lungs are hypoinflated. Linear opacity at the left lung base most consistent with atelectasis and/ or scarring. Scattered diffuse peribronchial thickening is present, which may reflect acute bronchiolitis. No consolidative opacity to suggest alveolar pneumonia. No pulmonary edema or pleural effusion. No pneumothorax. No acute osseus abnormality. Compression deformities at the thoracolumbar junction appear chronic in nature. Diffuse osteopenia. IMPRESSION: 1. Mild scattered peribronchial thickening, suggesting possible bronchiolitis. No radiographic evidence for alveolar pneumonia. 2. Mild left  basilar atelectasis and/or scarring. 3. Aortic atherosclerosis. Electronically Signed   By: Jeannine Boga M.D.   On: 06/17/2016 01:44    Procedures Procedures (including critical care time)  Medications Ordered in ED Medications  aspirin chewable tablet 324 mg (324 mg Oral Not Given 06/17/16 0114)  donepezil (ARICEPT) tablet 5 mg (not administered)  HYDROcodone-acetaminophen (NORCO/VICODIN) 5-325 MG per tablet 1 tablet (not administered)  acetaminophen (TYLENOL) tablet 650 mg (not administered)  ondansetron (ZOFRAN) injection 4 mg (not administered)  enoxaparin (LOVENOX) injection 40 mg (not administered)  morphine 4 MG/ML injection 1 mg (not administered)  gi cocktail (Maalox,Lidocaine,Donnatal) (not administered)  0.9 %  sodium chloride infusion ( Intravenous New Bag/Given 06/17/16 0504)  pantoprazole (PROTONIX) EC tablet 40 mg (40 mg Oral Given 06/17/16 4680)     Initial Impression /  Assessment and Plan / ED Course  I have reviewed the triage vital signs and the nursing notes.  Pertinent labs & imaging results that were available during my care of the patient were reviewed by me and considered in my medical decision making (see chart for details).    Atypical chest pain - trops x 2 ordered. Hx is not clear as to the cause of the chest pain. Pt has high HEAR score due to her age. I think it would be best to admit for serial trops.  Pt also has leg swelling. DVT study ordered. Dimer neg, and pretest probability for PE was already low, so no need for PE workup in my opinion.  Pt has weakness. No flu like symptoms. Will hydrate. ER workup is normal. No indication for ct head.  Final Clinical Impressions(s) / ED Diagnoses   Final diagnoses:  Atypical chest pain  Angina pectoris (HCC)  Swelling of left lower extremity    New Prescriptions Current Discharge Medication List     I personally performed the services described in this documentation, which was scribed in my presence. The recorded information has been reviewed and is accurate.      Varney Biles, MD 06/17/16 [phone]

## 2016-06-17 NOTE — H&P (Signed)
History and Physical    Dana Bush EQA:[id number] DOB: [birth year] DOA: 06/17/2016  Referring MD/NP/PA: Dr. Kathrynn Humble PCP: Deland Pretty, MD  Patient coming from: [name]  Chief Complaint: Chest  HPI: Dana Bush is a 83 y.o. female with medical history significant of HLD, dementia, anxiety, depression, chronic back pain, and osteoporosis; who presents with complaints of chest pain from the nursing facility. Patient somewhat of a poor historian due to history of dementia, but family is present at bedside to help. Patient was reported to have an acute onset of centralized, but more so left-sided chest pain. Pain was reported to radiate to her back and she had complained of palpitations. Associated symptoms included some shortness of breath. There was no report of nausea, vomiting, or diaphoresis. Family members make note that thepatient had accidentally been given Fosamax daily for 4 days then the last week when it issupposed to be a once weekly pill. They questioned if symptoms could be related to an overdose of Fosamax. En route with MS patient was given 324 mg of aspirin one sublingual nitroglycerin with relief of symptoms. Patient denies having any cough, wheeze, or recent sick contacts.  ED Course: Upon admission to the emergency department patient was was noted to have also asked within normal limits.  Labs revealed hemoglobin 10.8, sodium 133, troponin 0, d-dimer 0.42. EKG revealed normal sinus rhythm. Chest x-ray showed signs of bronchiolitis. TRH called to admit for chest pain rule out.   Review of Systems: As per HPI otherwise 10 point review of systems negative.   Past Medical History:  Diagnosis Date  . Anxiety   . Compression fracture of L2 lumbar vertebra (HCC)   . Dementia   . Depression   . Hyperlipemia   . IgG monoclonal gammopathy   . Osteoporosis   . Peptic ulcer disease     Past Surgical History:  Procedure Laterality Date  . cervical polyp  removal    . DILATION AND CURETTAGE OF UTERUS    . TONSILLECTOMY       reports that she has quit smoking. She smoked 1.00 pack per day. She has never used smokeless tobacco. She reports that she does not drink alcohol or use drugs.  No Known Allergies  No family history on file.  Prior to Admission medications   Medication Sig Start Date End Date Taking? Authorizing Provider  aspirin 81 MG chewable tablet Chew 162 mg by mouth daily.    [provider]  calcium carbonate (TUMS - DOSED IN MG ELEMENTAL CALCIUM) 500 MG chewable tablet Chew 2 tablets by mouth daily.    [provider]  Cranberry 250 MG TABS Take 1 tablet by mouth daily.    [provider]  donepezil (ARICEPT) 5 MG tablet Take 5 mg by mouth daily.  09/17/12   [provider]  fentaNYL (DURAGESIC - DOSED MCG/HR) 25 MCG/HR patch Place 1 patch onto the skin every 3 (three) days.  10/08/12   [provider]  HYDROcodone-acetaminophen (NORCO/VICODIN) 5-325 MG per tablet Take 1 tablet by mouth every 4 (four) hours as needed for pain.  08/19/12   [provider]  HYDROcodone-acetaminophen (NORCO/VICODIN) 5-325 MG per tablet Take 1 tablet by mouth every 8 (eight) hours as needed for pain. 10/15/12   Varney Biles, MD  ibuprofen (ADVIL,MOTRIN) 200 MG tablet Take 400 mg by mouth every 8 (eight) hours as needed. Pain.    [provider]  Multiple Vitamins-Minerals (MULTIVITAMINS THER. W/MINERALS) TABS  Take 1 tablet by mouth daily.      [provider]  ondansetron (ZOFRAN) 4 MG tablet Take 4 mg by mouth every 8 (eight) hours as needed.  08/19/12   [provider]  potassium chloride SA (K-DUR,KLOR-CON) 20 MEQ tablet Take 40 mEq by mouth 2 (two) times daily.    [provider]  sertraline (ZOLOFT) 25 MG tablet Take 25 mg by mouth daily.  08/19/12   [provider]  traMADol (ULTRAM) 50 MG tablet Take 50 mg by mouth every 8 (eight) hours as needed.  Maximum dose= 8 tablets per day; for pain.     [provider]    Physical Exam: Constitutional: Elderly female in NAD, calm, comfortable Vitals:   06/17/16 0145 06/17/16 0150 06/17/16 0200 06/17/16 0201  BP: 107/66  129/60   Pulse: 66  66   Resp: 15  14   Temp:      TempSrc:      SpO2: 92% 94% 93% 93%   Eyes: PERRL, lids and conjunctivae normal ENMT: Mucous membranes are moist. Posterior pharynx clear of any exudate or lesions.  Neck: normal, supple, no masses, no thyromegaly Respiratory: Basilar crackles appreciated. Normal respiratory effort. No accessory muscle use.  Cardiovascular: Regular rate and rhythm, no murmurs / rubs / gallops. Left lower extremity swelling appreciated when compared to the right lower extremity. 2+ pedal pulses. No carotid bruits.  Abdomen: no tenderness, no masses palpated. No hepatosplenomegaly. Bowel sounds positive.  Musculoskeletal: no clubbing / cyanosis. No joint deformity upper and lower extremities. Good ROM, no contractures. Normal muscle tone.  Skin: no rashes, lesions, ulcers. No induration Neurologic: CN 2-12 grossly intact. Sensation intact, DTR normal. Strength 5/5 in all 4.  Psychiatric: Normal judgment and insight. Alert and oriented x 3. Normal mood.     Labs on Admission: I have personally reviewed following labs and imaging studies  CBC:  Recent Labs Lab 06/17/16 0113  WBC 7.5  HGB 10.8*  HCT 32.3*  MCV 93.6  PLT 876   Basic Metabolic Panel:  Recent Labs Lab 06/17/16 0113  NA 133*  K 3.8  CL 104  CO2 21*  GLUCOSE 97  BUN 14  CREATININE 0.75  CALCIUM 9.0   GFR: CrCl cannot be calculated (Unknown ideal weight.). Liver Function Tests:  Recent Labs Lab 06/17/16 0113  AST 22  ALT 11*  ALKPHOS 52  BILITOT 0.8  PROT 6.4*  ALBUMIN 3.2*   No results for input(s): LIPASE, AMYLASE in the last 168 hours. No results for input(s): AMMONIA in the last 168 hours. Coagulation Profile:  Recent Labs Lab  06/17/16 0113  INR 1.00   Cardiac Enzymes: No results for input(s): CKTOTAL, CKMB, CKMBINDEX, TROPONINI in the last 168 hours. BNP (last 3 results) No results for input(s): PROBNP in the last 8760 hours. HbA1C: No results for input(s): HGBA1C in the last 72 hours. CBG: No results for input(s): GLUCAP in the last 168 hours. Lipid Profile: No results for input(s): CHOL, HDL, LDLCALC, TRIG, CHOLHDL, LDLDIRECT in the last 72 hours. Thyroid Function Tests: No results for input(s): TSH, T4TOTAL, FREET4, T3FREE, THYROIDAB in the last 72 hours. Anemia Panel: No results for input(s): VITAMINB12, FOLATE, FERRITIN, TIBC, IRON, RETICCTPCT in the last 72 hours. Urine analysis:    Component Value Date/Time   COLORURINE YELLOW 08/10/2011 1650   APPEARANCEUR CLOUDY (A) 08/10/2011 1650   LABSPEC 1.017 08/10/2011 1650   PHURINE 6.5 08/10/2011 1650   GLUCOSEU NEGATIVE 08/10/2011 1650  HGBUR NEGATIVE 08/10/2011 1650   BILIRUBINUR NEGATIVE 08/10/2011 1650   KETONESUR 40 (A) 08/10/2011 1650   PROTEINUR NEGATIVE 08/10/2011 1650   UROBILINOGEN 0.2 08/10/2011 1650   NITRITE NEGATIVE 08/10/2011 1650   LEUKOCYTESUR NEGATIVE 08/10/2011 1650   Sepsis Labs: No results found for this or any previous visit (from the past 240 hour(s)).   Radiological Exams on Admission: Dg Chest 2 View  Result Date: 06/17/2016 CLINICAL DATA:  Initial evaluation for acute chest pain, right-sided rales. EXAM: CHEST  2 VIEW COMPARISON:  Prior radiograph and CT from 07/28/2011. FINDINGS: Transverse heart size at the upper limits of normal, stable. Mediastinal silhouette within normal limits. Aortic atherosclerosis. Lungs are hypoinflated. Linear opacity at the left lung base most consistent with atelectasis and/ or scarring. Scattered diffuse peribronchial thickening is present, which may reflect acute bronchiolitis. No consolidative opacity to suggest alveolar pneumonia. No pulmonary edema or pleural effusion. No pneumothorax.  No acute osseus abnormality. Compression deformities at the thoracolumbar junction appear chronic in nature. Diffuse osteopenia. IMPRESSION: 1. Mild scattered peribronchial thickening, suggesting possible bronchiolitis. No radiographic evidence for alveolar pneumonia. 2. Mild left basilar atelectasis and/or scarring. 3. Aortic atherosclerosis. Electronically Signed   By: Jeannine Boga M.D.   On: 06/17/2016 01:44    EKG: Independently reviewed. Normal sinus rhythm  Assessment/Plan Chest pain: Acute. Patient presents with complaints of acute onset of chest pain. Initial troponin negative and EKG showing no significant ischemic changes. Question of symptoms likely secondary to Fosamax. - Admit to telemetry bed  - Trend cardiac enzymes - Recheck EKG in a.m. - ASA  - Check echocardiogram in a.m.  - Determine if formal cardiology consultation is warranted  Possible bronchitis: Patient without reports of cough. Chest x-ray shows signs of bronchiolitis.   - DuoNeb prn shortness of breath or wheezing  Question Fosamax overdose: Patient was given 70mg  of fosamax daily from 5/18-5/21. - Discussed with pharmacy who recommended PPI - Protonix po  Left lower extremity swelling: D-dimer negative at 0.47. ED physician ordered a vascular duplex ultrasound as patient was not have significant left lower extremity swelling. - Follow-up vascular duplex ultrasound lower extremity   Normocytic normochromic anemia: Hemoglobin initially 10.8 - Continue monitoring  Hyponatremia: Sodium 133 on admission.  H/O osteoporosis with previous history of compression fractures.  - Restart Fosamax medically appropriate  Dementia: Stable - continue Aricept   DVT prophylaxis: Lovenox  Code Status: Full Family Communication:   discuss plan of care with the patient family present at bedside  Disposition Plan:  Likely discharge back to nursing facility tomorrow, if cardiac workup negative. Consults called:  None Admission status: Inpatient   Norval Morton MD [facility] Pager [phone]  If 7PM-7AM, please contact night-coverage [email or web address] Password [facility]  06/17/2016, 3:32 AM

## 2016-06-17 NOTE — ED Notes (Signed)
Patient transported to X-ray 

## 2016-06-17 NOTE — Care Management Note (Signed)
Case Management Note  Patient Details  Name: Dana Bush MRN: [id number] Date of Birth: 12/14/1926  Subjective/Objective:          Patient in obs for CP. From [name]. Placed CSW consult for patient admitted from facility.          Action/Plan:  Will continue to follow.  Expected Discharge Date:                  Expected Discharge Plan:  Skilled Nursing Facility  In-House Referral:  Clinical Social Work  Discharge planning Services  CM Consult  Post Acute Care Choice:    Choice offered to:     DME Arranged:    DME Agency:     HH Arranged:    [place] Agency:     Status of Service:  In process, will continue to follow  If discussed at Long Length of Stay Meetings, dates discussed:    Additional Comments:  Carles Collet, RN 06/17/2016, 2:53 PM

## 2016-06-17 NOTE — ED Triage Notes (Signed)
Patient is from [facility], patient started having chest wall pain in the center of her chest, radiating to her back and with palpation, comes and goes.  No nausea or vomiting or diaphoresis per [facility].  Patient was given 324mg  ASA, one sl nitro en route to ED.  Pain has subsided per EMS.  20G RW.

## 2016-06-17 NOTE — Progress Notes (Signed)
Call received from/returned to [facility] [place].  Confirmed that pt did in fact receive fosamax four days in a row, versus the once weekly dosing prescribed. Pt reported as stable to [facility] staff, and plan of care relayed as 'cardiac work-up in progress, sx not worsening'.

## 2016-06-17 NOTE — Progress Notes (Signed)
Patient arrived in the unit accompanied by NT via stretcher. Family members at bedside. Orientation to the unit given. Patient and family members  verbalizes understanding

## 2016-06-17 NOTE — Progress Notes (Signed)
*  PRELIMINARY RESULTS* Vascular Ultrasound Left lower extremity venous duplex has been completed.  Preliminary findings: No evidence of DVT or baker's cyst.   Landry Mellow, RDMS, RVT  06/17/2016, 3:52 PM

## 2016-06-18 ENCOUNTER — Observation Stay (HOSPITAL_COMMUNITY): Payer: Medicare Other

## 2016-06-18 ENCOUNTER — Observation Stay (HOSPITAL_BASED_OUTPATIENT_CLINIC_OR_DEPARTMENT_OTHER): Payer: Medicare Other

## 2016-06-18 DIAGNOSIS — M810 Age-related osteoporosis without current pathological fracture: Secondary | ICD-10-CM | POA: Diagnosis not present

## 2016-06-18 DIAGNOSIS — M7989 Other specified soft tissue disorders: Secondary | ICD-10-CM | POA: Diagnosis not present

## 2016-06-18 DIAGNOSIS — I340 Nonrheumatic mitral (valve) insufficiency: Secondary | ICD-10-CM | POA: Diagnosis not present

## 2016-06-18 DIAGNOSIS — R079 Chest pain, unspecified: Secondary | ICD-10-CM | POA: Diagnosis not present

## 2016-06-18 DIAGNOSIS — R0789 Other chest pain: Secondary | ICD-10-CM | POA: Diagnosis not present

## 2016-06-18 DIAGNOSIS — E871 Hypo-osmolality and hyponatremia: Secondary | ICD-10-CM | POA: Diagnosis not present

## 2016-06-18 LAB — ECHOCARDIOGRAM COMPLETE
Height: 62 in
Weight: 1961.6 oz

## 2016-06-18 NOTE — Progress Notes (Addendum)
PROGRESS NOTE    Dana Bush  JGO:[id number] DOB: Nov 24, 1926 DOA: 06/17/2016 PCP: Deland Pretty, MD   Brief Narrative: 83 y.o. female with medical history significant of HLD, dementia, anxiety, depression, chronic back pain, and osteoporosis; who presents with complaints of chest pain from the nursing facility.  Assessment & Plan:  # Chest pain:  -Troponin and EKG unremarkable. Patient denied chest pain or shortness of breath today. History is unremarkable. Still waiting for echocardiogram. -Chest x-ray with possible bronchitis with no sign of pneumonia -D-dimer not elevated. Doppler ultrasound negative for DVT. -Continue supportive care - on aspirin a - Unknown if pain is related with acid reflux to have her continue Protonix and GI cocktail.  #Possible dementia without behavioral issue: Order PT OT evaluation. Social worker consulted for safe discharge planning. Continue supportive care. Lower IV fluids today. Encourage oral intake.  # Chronic hyponatremia: Stable.  Principal Problem:   Chest pain Active Problems:   Hyponatremia   Osteoporosis   Left leg swelling   DVT prophylaxis: Lovenox Code Status: Full code Family Communication: No family at bedside Disposition Plan: Likely discharge to [facility] in 1-2 days. Awaiting echo, PT/ OT evaluation, waiting social worker evaluation.    Consultants:   None  Procedures: Doppler ultrasound Antimicrobials: None  Subjective: Seen and examined at bedside. Denied headache, dizziness, chest pain, shortness of breath, nocturnal pain.  Objective: Vitals:   06/17/16 1620 06/17/16 2044 06/18/16 0350 06/18/16 0426  BP: (!) 148/55 (!) 167/65  (!) 153/54  Pulse: 71 71  65  Resp: 16 17  16   Temp: 98.4 F (36.9 C) 99 F (37.2 C)  98.3 F (36.8 C)  TempSrc: Oral Oral  Oral  SpO2: 98% 98%  95%  Weight:   55.6 kg (122 lb 9.6 oz)   Height:        Intake/Output Summary (Last 24 hours) at 06/18/16 1158 Last data filed  at 06/18/16 1054  Gross per 24 hour  Intake             2085 ml  Output             2076 ml  Net                9 ml   Filed Weights   06/17/16 0459 06/18/16 0350  Weight: 55.1 kg (121 lb 6.4 oz) 55.6 kg (122 lb 9.6 oz)    Examination:  General exam: Pleasant elderly female lying on bed comfortable, not in distress Respiratory system: Clear to auscultation. Respiratory effort normal. No wheezing or crackle Cardiovascular system: S1 & S2 heard, RRR.  No pedal edema. Gastrointestinal system: Abdomen is nondistended, soft and nontender. Normal bowel sounds heard. Central nervous system: Alert awake and following commands Extremities: Symmetric 5 x 5 power. Skin: No rashes, lesions or ulcers   Data Reviewed: I have personally reviewed following labs and imaging studies  CBC:  Recent Labs Lab 06/17/16 0113  WBC 7.5  HGB 10.8*  HCT 32.3*  MCV 93.6  PLT 559   Basic Metabolic Panel:  Recent Labs Lab 06/17/16 0113  NA 133*  K 3.8  CL 104  CO2 21*  GLUCOSE 97  BUN 14  CREATININE 0.75  CALCIUM 9.0   GFR: Estimated Creatinine Clearance: 38.4 mL/min (by C-G formula based on SCr of 0.75 mg/dL). Liver Function Tests:  Recent Labs Lab 06/17/16 0113  AST 22  ALT 11*  ALKPHOS 52  BILITOT 0.8  PROT 6.4*  ALBUMIN 3.2*  No results for input(s): LIPASE, AMYLASE in the last 168 hours. No results for input(s): AMMONIA in the last 168 hours. Coagulation Profile:  Recent Labs Lab 06/17/16 0113  INR 1.00   Cardiac Enzymes:  Recent Labs Lab 06/17/16 0430 06/17/16 0716 06/17/16 0756  TROPONINI <0.03 <0.03 <0.03   BNP (last 3 results) No results for input(s): PROBNP in the last 8760 hours. HbA1C: No results for input(s): HGBA1C in the last 72 hours. CBG: No results for input(s): GLUCAP in the last 168 hours. Lipid Profile: No results for input(s): CHOL, HDL, LDLCALC, TRIG, CHOLHDL, LDLDIRECT in the last 72 hours. Thyroid Function Tests: No results for  input(s): TSH, T4TOTAL, FREET4, T3FREE, THYROIDAB in the last 72 hours. Anemia Panel: No results for input(s): VITAMINB12, FOLATE, FERRITIN, TIBC, IRON, RETICCTPCT in the last 72 hours. Sepsis Labs: No results for input(s): PROCALCITON, LATICACIDVEN in the last 168 hours.  Recent Results (from the past 240 hour(s))  MRSA PCR Screening     Status: None   Collection Time: 06/17/16  5:11 AM  Result Value Ref Range Status   MRSA by PCR NEGATIVE NEGATIVE Final    Comment:        The GeneXpert MRSA Assay (FDA approved for NASAL specimens only), is one component of a comprehensive MRSA colonization surveillance program. It is not intended to diagnose MRSA infection nor to guide or monitor treatment for MRSA infections.          Radiology Studies: Dg Chest 2 View  Result Date: 06/17/2016 CLINICAL DATA:  Initial evaluation for acute chest pain, right-sided rales. EXAM: CHEST  2 VIEW COMPARISON:  Prior radiograph and CT from 07/28/2011. FINDINGS: Transverse heart size at the upper limits of normal, stable. Mediastinal silhouette within normal limits. Aortic atherosclerosis. Lungs are hypoinflated. Linear opacity at the left lung base most consistent with atelectasis and/ or scarring. Scattered diffuse peribronchial thickening is present, which may reflect acute bronchiolitis. No consolidative opacity to suggest alveolar pneumonia. No pulmonary edema or pleural effusion. No pneumothorax. No acute osseus abnormality. Compression deformities at the thoracolumbar junction appear chronic in nature. Diffuse osteopenia. IMPRESSION: 1. Mild scattered peribronchial thickening, suggesting possible bronchiolitis. No radiographic evidence for alveolar pneumonia. 2. Mild left basilar atelectasis and/or scarring. 3. Aortic atherosclerosis. Electronically Signed   By: Jeannine Boga M.D.   On: 06/17/2016 01:44        Scheduled Meds: . aspirin  324 mg Oral Once  . donepezil  5 mg Oral Daily  .  enoxaparin (LOVENOX) injection  40 mg Subcutaneous Q24H  . pantoprazole  40 mg Oral BID WC   Continuous Infusions: . sodium chloride 75 mL/hr at 06/17/16 1849     LOS: 0 days    Pattiann Solanki Tanna Furry, MD [facility] Pager [phone]  If 7PM-7AM, please contact night-coverage [email or web address] Password [email or web address] 06/18/2016, 11:58 AM

## 2016-06-19 DIAGNOSIS — E871 Hypo-osmolality and hyponatremia: Secondary | ICD-10-CM | POA: Diagnosis not present

## 2016-06-19 DIAGNOSIS — R079 Chest pain, unspecified: Secondary | ICD-10-CM | POA: Diagnosis not present

## 2016-06-19 MED ORDER — PANTOPRAZOLE SODIUM 40 MG PO TBEC: 40.0000 mg | DELAYED_RELEASE_TABLET | Freq: Every day | ORAL | Status: DC

## 2016-06-19 MED ORDER — CLONAZEPAM 0.25 MG PO TBDP: 0.2500 mg | ORAL_TABLET | Freq: Two times a day (BID) | ORAL | 0 refills | Status: DC | PRN

## 2016-06-19 NOTE — Evaluation (Signed)
Physical Therapy Evaluation Patient Details Name: Dana Bush MRN: [id number] DOB: [birth year] Today's Date: 06/19/2016   History of Present Illness  83 y.o.femalewho presents with complaints of chest pain from the nursing facility. Medical history significant ofHLD, dementia, anxiety, depression, chronic back pain, and osteoporosis.  Clinical Impression  Pt presents with the above diagnosis and below deficits for therapy evaluation. Prior to admission, pt was living at [facility] and had 24hr cg supervision. Pt reports that she was staying at home with her husband. Pt is able to perform mobility with Min guard to supervision this session. Pt will benefit from continued rehab at [facility] in order to maximize her functional mobility.     Follow Up Recommendations Home health PT;Supervision/Assistance - 24 hour;Other (comment) (At [facility])    Equipment Recommendations  None recommended by PT    Recommendations for Other Services       Precautions / Restrictions Precautions Precautions: Fall Restrictions Weight Bearing Restrictions: No      Mobility  Bed Mobility Overal bed mobility: Needs Assistance Bed Mobility: Supine to Sit     Supine to sit: Supervision     General bed mobility comments: supervision for safety with use of railings to get to EOB  Transfers Overall transfer level: Needs assistance Equipment used: Rolling walker (2 wheeled) Transfers: Sit to/from Stand Sit to Stand: Min guard         General transfer comment: Min guard for safety from EOB  Ambulation/Gait Ambulation/Gait assistance: Min guard Ambulation Distance (Feet): 20 Feet Assistive device: Rolling walker (2 wheeled) Gait Pattern/deviations: Step-through pattern;Decreased step length - right;Decreased step length - left     General Gait Details: cues for proximity to RW and to maneuver around obstacles in room  Stairs            Wheelchair Mobility     Modified Rankin (Stroke Patients Only)       Balance Overall balance assessment: Needs assistance Sitting-balance support: Feet supported;No upper extremity supported Sitting balance-Leahy Scale: Good     Standing balance support: During functional activity;Bilateral upper extremity supported Standing balance-Leahy Scale: Fair Standing balance comment: Requires at least single UE support in standing as seen during functional mobility and grooming at sink                             Pertinent Vitals/Pain Pain Assessment: No/denies pain    Home Living Family/patient expects to be discharged to:: Private residence Living Arrangements: Spouse/significant other Available Help at Discharge: Family;Available 24 hours/day Type of Home: House Home Access: Stairs to enter Entrance Stairs-Rails: Right Entrance Stairs-Number of Steps: 4 Home Layout: One level Home Equipment: Grab bars - tub/shower Additional Comments: all information collected from pt. Per chart, pt from [name]    Prior Function Level of Independence: Needs assistance   Gait / Transfers Assistance Needed: per pt doesn't use an AD  ADL's / Homemaking Assistance Needed: requires assistance for meal prep and IADLs        Hand Dominance   Dominant Hand: Right    Extremity/Trunk Assessment   Upper Extremity Assessment Upper Extremity Assessment: Defer to OT evaluation    Lower Extremity Assessment Lower Extremity Assessment: Generalized weakness    Cervical / Trunk Assessment Cervical / Trunk Assessment: Normal  Communication   Communication: No difficulties  Cognition Arousal/Alertness: Awake/alert Behavior During Therapy: WFL for tasks assessed/performed Overall Cognitive Status: No family/caregiver present to determine baseline cognitive  functioning                                 General Comments: Pt oriented to self and place, not to time. Able to recall season.        General Comments      Exercises     Assessment/Plan    PT Assessment All further PT needs can be met in the next venue of care  PT Problem List Decreased activity tolerance;Decreased balance;Decreased mobility       PT Treatment Interventions      PT Goals (Current goals can be found in the Care Plan section)  Acute Rehab PT Goals Patient Stated Goal: Unstated PT Goal Formulation: With patient Time For Goal Achievement: 06/26/16 Potential to Achieve Goals: Good    Frequency     Barriers to discharge        Co-evaluation PT/OT/SLP Co-Evaluation/Treatment: Yes Reason for Co-Treatment: To address functional/ADL transfers PT goals addressed during session: Mobility/safety with mobility         AM-PAC PT "6 Clicks" Daily Activity  Outcome Measure Difficulty turning over in bed (including adjusting bedclothes, sheets and blankets)?: None Difficulty moving from lying on back to sitting on the side of the bed? : None Difficulty sitting down on and standing up from a chair with arms (e.g., wheelchair, bedside commode, etc,.)?: A Little Help needed moving to and from a bed to chair (including a wheelchair)?: A Little Help needed walking in hospital room?: A Little Help needed climbing 3-5 steps with a railing? : A Little 6 Click Score: 20    End of Session Equipment Utilized During Treatment: Gait belt Activity Tolerance: Patient tolerated treatment well Patient left: in chair;with call bell/phone within reach;with chair alarm set Nurse Communication: Mobility status PT Visit Diagnosis: Other abnormalities of gait and mobility (R26.89)    Time: 0802-0823 PT Time Calculation (min) (ACUTE ONLY): 21 min   Charges:   PT Evaluation $PT Eval Low Complexity: 1 Procedure     PT G Codes:   PT G-Codes **NOT FOR INPATIENT CLASS** Functional Assessment Tool Used: AM-PAC 6 Clicks Basic Mobility;Clinical judgement Functional Limitation: Mobility: Walking and moving  around Mobility: Walking and Moving Around Current Status (L9532): At least 20 percent but less than 40 percent impaired, limited or restricted Mobility: Walking and Moving Around Goal Status [phone]): At least 20 percent but less than 40 percent impaired, limited or restricted Mobility: Walking and Moving Around Discharge Status [phone]): At least 20 percent but less than 40 percent impaired, limited or restricted    Dana Bush PT, DPT  [phone]   Shanon Rosser 06/19/2016, 12:53 PM

## 2016-06-19 NOTE — Progress Notes (Signed)
Patient report given to nurse Caren Griffins at [facility]. Marcille Blanco, RN

## 2016-06-19 NOTE — Progress Notes (Signed)
Patient discharged to [facility] with all her belongings. [facility] here to pick up.  No concerns voiced at the time of discharge. Marcille Blanco, RN

## 2016-06-19 NOTE — Progress Notes (Signed)
Pt accepted back to [facility] today.  CSW spoke with husband and he is agreeable to [facility] transporting pt.  Ambulance transport arranged.  Creta Levin, LCSW Weekend Coverage [phone]

## 2016-06-19 NOTE — NC FL2 (Signed)
[place] MEDICAID FL2 LEVEL OF CARE SCREENING TOOL     IDENTIFICATION  Patient Name: Dana Bush Birthdate: 1926/02/04 Sex: female Admission Date (Current Location): 06/17/2016  [facility] and [place] Number:  Herbalist and Address:  [facility]. [facility], [place] [address], [place], [place] [address]      Provider Number: [id number]  Attending Physician Name and Address:  Dana Fire, MD  Relative Name and Phone Number:       Current Level of Care: Hospital Recommended Level of Care: [place] Prior Approval Number:    Date Approved/Denied:   PASRR Number:    Discharge Plan: Other (Comment) ([facility])    Current Diagnoses: Patient Active Problem List   Diagnosis Date Noted  . Chest pain 06/17/2016  . Left leg swelling 06/17/2016  . Drug overdose, accidental or unintentional, initial encounter 06/17/2016  . Malnutrition of mild degree ([place]) 01/09/2011  . Dizziness 01/09/2011  . Compression fracture of L1 lumbar vertebra ([place]) 01/05/2011  . Fall 01/05/2011  . Hyponatremia 01/05/2011  . Osteoporosis 01/05/2011    Orientation RESPIRATION BLADDER Height & Weight     Self, Situation, Place  Normal Continent Weight: 121 lb 11.2 oz (55.2 kg) (Scale B) Height:  5\' 2"  (157.5 cm)  BEHAVIORAL SYMPTOMS/MOOD NEUROLOGICAL BOWEL NUTRITION STATUS      Continent Diet (low sodium heart healthy)  AMBULATORY STATUS COMMUNICATION OF NEEDS Skin   Limited Assist Verbally Normal                       Personal Care Assistance Level of Assistance  Bathing, Feeding, Dressing Bathing Assistance: Limited assistance Feeding assistance: Limited assistance Dressing Assistance: Limited assistance     Functional Limitations Info  Sight, Hearing, Speech Sight Info: Adequate Hearing Info: Adequate Speech Info: Adequate    SPECIAL CARE FACTORS FREQUENCY  PT (By licensed PT), OT (By licensed OT)     PT Frequency:  (home  health eval and treat) OT Frequency:  (home health eval and treat)            Contractures Contractures Info: Not present    Additional Factors Info  Code Status (full)               Current Medications (06/19/2016):  This is the current hospital active medication list Current Facility-Administered Medications  Medication Dose Route Frequency Provider Last Rate Last Dose  . acetaminophen (TYLENOL) tablet 650 mg  650 mg Oral Q4H PRN Smith, Rondell A, MD      . aspirin chewable tablet 324 mg  324 mg Oral Once Nanavati, Ankit, MD      . donepezil (ARICEPT) tablet 5 mg  5 mg Oral Daily Tamala Julian, Rondell A, MD   5 mg at 06/19/16 0941  . enoxaparin (LOVENOX) injection 40 mg  40 mg Subcutaneous Q24H Tamala Julian, Rondell A, MD   40 mg at 06/19/16 0941  . gi cocktail (Maalox,Lidocaine,Donnatal)  30 mL Oral QID PRN Fuller Plan A, MD      . HYDROcodone-acetaminophen (NORCO/VICODIN) 5-325 MG per tablet 1 tablet  1 tablet Oral Q8H PRN Norval Morton, MD   1 tablet at 06/18/16 2024  . ondansetron (ZOFRAN) injection 4 mg  4 mg Intravenous Q6H PRN Smith, Rondell A, MD      . pantoprazole (PROTONIX) EC tablet 40 mg  40 mg Oral BID WC Dana Fire, MD   40 mg at 06/19/16 [phone]  Discharge Medications: Please see discharge summary for a list of discharge medications.  Relevant Imaging Results:  Relevant Lab Results:   Additional Information    [name], LCSW

## 2016-06-19 NOTE — Evaluation (Signed)
Occupational Therapy Evaluation Patient Details Name: Dana Bush MRN: [id number] DOB: Oct 15, 1926 Today's Date: 06/19/2016    History of Present Illness 83 y.o.femalewho presents with complaints of chest pain from the nursing facility. Medical history significant ofHLD, dementia, anxiety, depression, chronic back pain, and osteoporosis.   Clinical Impression   Due to decreased cognition, pt is a poor historian for collecting PLOF and home living. Pt is very pleasant and willing to participate in therapy. Per chart, pt from [name] PTA. Pt reports that she was performing ADLs and living at home with her husband. Currently, pt performs ADLs and functional mobility with RW and Min guard for safety. Feel pt would benefit from skilled OT while admitted to facilitate safe dc. Recommend dc back to ALF with 24 hour supervision.     Follow Up Recommendations  Supervision/Assistance - 24 hour;Other (comment) (Return to prior living situation; ALF/SNF)    Equipment Recommendations  None recommended by OT    Recommendations for Other Services       Precautions / Restrictions Precautions Precautions: Fall Restrictions Weight Bearing Restrictions: No      Mobility Bed Mobility Overal bed mobility: Needs Assistance Bed Mobility: Supine to Sit     Supine to sit: Supervision     General bed mobility comments: supervision for safety with use of railings to get to EOB  Transfers Overall transfer level: Needs assistance Equipment used: Rolling walker (2 wheeled) Transfers: Sit to/from Stand Sit to Stand: Min guard         General transfer comment: Min guard for safety from EOB    Balance Overall balance assessment: Needs assistance Sitting-balance support: Feet supported;No upper extremity supported Sitting balance-Leahy Scale: Good     Standing balance support: During functional activity;Bilateral upper extremity supported Standing balance-Leahy Scale:  Fair Standing balance comment: Requires at least single UE support in standing as seen during functional mobility and grooming at sink                           ADL either performed or assessed with clinical judgement   ADL Overall ADL's : Needs assistance/impaired Eating/Feeding: Set up;Sitting   Grooming: Brushing hair;Wash/dry hands;Wash/dry face;Supervision/safety;Set up;Standing   Upper Body Bathing: Supervision/ safety;Sitting   Lower Body Bathing: Min guard;Sit to/from stand   Upper Body Dressing : Supervision/safety;Set up;Sitting   Lower Body Dressing: Min guard;Sit to/from stand Lower Body Dressing Details (indicate cue type and reason): Pt demonstrates good ROM to adjust socks and is able to flex knee up to chest while in bed Toilet Transfer: Min guard;Regular Toilet;RW;Ambulation Toilet Transfer Details (indicate cue type and reason): Pt performed toileting at regular toilet; demonstrates steady balance and safe technique Toileting- Clothing Manipulation and Hygiene: Min guard;Sit to/from stand       Functional mobility during ADLs: Min guard;Rolling walker General ADL Comments: Pt is performing ADLs and functional mobility at [place] gaurd level with RW.      Vision         Perception     Praxis      Pertinent Vitals/Pain Pain Assessment: No/denies pain     Hand Dominance Right   Extremity/Trunk Assessment Upper Extremity Assessment Upper Extremity Assessment: Generalized weakness   Lower Extremity Assessment Lower Extremity Assessment: Generalized weakness   Cervical / Trunk Assessment Cervical / Trunk Assessment: Normal   Communication Communication Communication: No difficulties   Cognition Arousal/Alertness: Awake/alert Behavior During Therapy: WFL for tasks assessed/performed Overall Cognitive Status:  No family/caregiver present to determine baseline cognitive functioning                                 General  Comments: Pt oriented to self and place, not to time. Able to recall season.    General Comments       Exercises     Shoulder Instructions      Home Living Family/patient expects to be discharged to:: Private residence Living Arrangements: Spouse/significant other Available Help at Discharge: Family;Available 24 hours/day Type of Home: House Home Access: Stairs to enter [employer] of Steps: 4 Entrance Stairs-Rails: Right Home Layout: One level     Bathroom Shower/Tub: Teacher, early years/pre: Standard     Home Equipment: Grab bars - tub/shower   Additional Comments: all information collected from pt. Per chart, pt from [name]      Prior Functioning/Environment Level of Independence: Needs assistance  Gait / Transfers Assistance Needed: per pt doesn't use an AD ADL's / Homemaking Assistance Needed: requires assistance for meal prep and IADLs            OT Problem List: Decreased strength;Decreased range of motion;Decreased activity tolerance;Impaired balance (sitting and/or standing);Decreased cognition;Decreased safety awareness;Decreased knowledge of use of DME or AE;Decreased knowledge of precautions      OT Treatment/Interventions: Self-care/ADL training;Therapeutic exercise;Energy conservation;DME and/or AE instruction;Therapeutic activities;Cognitive remediation/compensation;Patient/family education    OT Goals(Current goals can be found in the care plan section) Acute Rehab OT Goals Patient Stated Goal: Unstated ADL Goals Pt Will Perform Grooming: with supervision;standing Pt Will Perform Upper Body Dressing: sitting;with modified independence Pt Will Perform Lower Body Dressing: with supervision;sit to/from stand Pt Will Transfer to Toilet: with supervision;ambulating;bedside commode Pt Will Perform Tub/Shower Transfer: Tub transfer;3 in 1;ambulating;with min guard assist;rolling walker  OT Frequency: Min 2X/week   Barriers  to D/C:            Co-evaluation PT/OT/SLP Co-Evaluation/Treatment: Yes Reason for Co-Treatment: To address functional/ADL transfers PT goals addressed during session: Mobility/safety with mobility OT goals addressed during session: ADL's and self-care      AM-PAC PT "6 Clicks" Daily Activity     Outcome Measure Help from another person eating meals?: None Help from another person taking care of personal grooming?: A Little Help from another person toileting, which includes using toliet, bedpan, or urinal?: A Little Help from another person bathing (including washing, rinsing, drying)?: A Little Help from another person to put on and taking off regular upper body clothing?: A Little Help from another person to put on and taking off regular lower body clothing?: A Little 6 Click Score: 19   End of Session Equipment Utilized During Treatment: Gait belt;Rolling walker Nurse Communication: Mobility status  Activity Tolerance: Patient tolerated treatment well Patient left: in chair;with call bell/phone within reach;with chair alarm set  OT Visit Diagnosis: Unsteadiness on feet (R26.81);Muscle weakness (generalized) (M62.81);Other abnormalities of gait and mobility (R26.89);History of falling (Z91.81);Other symptoms and signs involving cognitive function                Time: 0802-0823 OT Time Calculation (min): 21 min Charges:  OT General Charges $OT Visit: 1 Procedure OT Evaluation $OT Eval Low Complexity: 1 Procedure G-Codes: OT G-codes **NOT FOR INPATIENT CLASS** Functional Assessment Tool Used: Clinical judgement Functional Limitation: Self care Self Care Current Status (X7353): At least 1 percent but less than 20 percent impaired, limited or restricted  Self Care Goal Status [phone]): At least 1 percent but less than 20 percent impaired, limited or restricted   [facility], OTR/L [place] 06/19/2016, 10:08 AM

## 2016-06-19 NOTE — Discharge Summary (Signed)
Physician Discharge Summary  Dana Bush:[id number] DOB: 03/09/1926 DOA: 06/17/2016  PCP: Deland Pretty, MD  Admit date: 06/17/2016 Discharge date: 06/19/2016  Admitted From:SNF Disposition:SNF  Recommendations for Outpatient Follow-up:  1. Follow up with PCP in 1-2 weeks 2. Please obtain BMP/CBC in one week  Home Health:no Equipment/Devices:no Discharge Condition:stable CODE STATUS:full Diet recommendation:heart healthy  Brief/Interim Summary: 83 y.o. female with medical history significant of HLD, dementia, anxiety, depression, chronic back pain, and osteoporosis; who presents with complaints of chest pain from the nursing facility.Troponin and EKG unremarkable. Patient has no further chest pain in the hospital. Echocardiogram unremarkable with good systolic function. Doppler ultrasound of lower extremity weakness and no DVTs. Labs unremarkable, x-ray with possible bronchitis with no sign of pneumonia. D-dimer not elevated. Hyponatremia on admission is stable. Patient is clinically improved. Unknown if her pain was related with acid reflux. Started on PPI.  Today, patient denied headache, dizziness, nausea, vomiting, chest pain or shortness of breath. She is comfortable sitting on chair. Patient will be discharged to skilled nursing facility in a stable condition with outpatient follow-up with PCP.  Discharge Diagnoses:  Principal Problem:   Chest pain Active Problems:   Hyponatremia   Osteoporosis   Left leg swelling     Discharge Instructions  Discharge Instructions    Call MD for:  difficulty breathing, headache or visual disturbances    Complete by:  As directed    Call MD for:  extreme fatigue    Complete by:  As directed    Call MD for:  hives    Complete by:  As directed    Call MD for:  persistant dizziness or light-headedness    Complete by:  As directed    Call MD for:  persistant nausea and vomiting    Complete by:  As directed    Call MD for:   severe uncontrolled pain    Complete by:  As directed    Call MD for:  temperature >100.4    Complete by:  As directed    Diet - low sodium heart healthy    Complete by:  As directed    Increase activity slowly    Complete by:  As directed      Allergies as of 06/19/2016   No Known Allergies     Medication List    TAKE these medications   alendronate 70 MG tablet Commonly known as:  FOSAMAX Take 70 mg by mouth once a week. Take with a full glass of water on an empty stomach.   aspirin-acetaminophen-caffeine 250-250-65 MG tablet Commonly known as:  EXCEDRIN MIGRAINE Take 1-2 tablets by mouth every 6 (six) hours as needed for headache.   Calcium Carb-Cholecalciferol 600-400 MG-UNIT Tabs Take 1 tablet by mouth 2 (two) times daily.   clonazePAM 0.25 MG disintegrating tablet Commonly known as:  KLONOPIN Take 0.25 mg by mouth 2 (two) times daily as needed (anxiety).   donepezil 5 MG tablet Commonly known as:  ARICEPT Take 10 mg by mouth daily.   HYDROcodone-acetaminophen 5-325 MG tablet Commonly known as:  NORCO/VICODIN Take 1 tablet by mouth every 8 (eight) hours as needed for pain. What changed:  Another medication with the same name was removed. Continue taking this medication, and follow the directions you see here.   pantoprazole 40 MG tablet Commonly known as:  PROTONIX Take 1 tablet (40 mg total) by mouth daily.      Follow-up Information    Deland Pretty, MD. Schedule an  appointment as soon as possible for a visit in 1 week(s).   Specialty:  Internal Medicine Contact information: [address] [place] [address] [phone]          No Known Allergies  Consultations: None  Procedures/Studies: Echo  Subjective: Seen and examined at bedside. Sitting on chair. Her comfortable. Denied headache, dizziness, nausea, vomiting, chest pain or shortness of breath. No abdominal pain.  Discharge Exam: Vitals:   06/18/16 2025 06/19/16  0500  BP: (!) 162/78 (!) 159/80  Pulse: 69 73  Resp: 19 19  Temp: 98.4 F (36.9 C) 97.8 F (36.6 C)   Vitals:   06/18/16 0426 06/18/16 1220 06/18/16 2025 06/19/16 0500  BP: (!) 153/54 (!) 135/57 (!) 162/78 (!) 159/80  Pulse: 65 66 69 73  Resp: 16 20 19 19   Temp: 98.3 F (36.8 C) 98.5 F (36.9 C) 98.4 F (36.9 C) 97.8 F (36.6 C)  TempSrc: Oral Oral Oral Oral  SpO2: 95% 96% 100% 98%  Weight:    55.2 kg (121 lb 11.2 oz)  Height:        General: Pt is alert, awake, not in acute distress Cardiovascular: RRR, S1/S2 +, no rubs, no gallops Respiratory: CTA bilaterally, no wheezing, no rhonchi Abdominal: Soft, NT, ND, bowel sounds + Extremities: no edema, no cyanosis    The results of significant diagnostics from this hospitalization (including imaging, microbiology, ancillary and laboratory) are listed below for reference.     Microbiology: Recent Results (from the past 240 hour(s))  MRSA PCR Screening     Status: None   Collection Time: 06/17/16  5:11 AM  Result Value Ref Range Status   MRSA by PCR NEGATIVE NEGATIVE Final    Comment:        The GeneXpert MRSA Assay (FDA approved for NASAL specimens only), is one component of a comprehensive MRSA colonization surveillance program. It is not intended to diagnose MRSA infection nor to guide or monitor treatment for MRSA infections.      Labs: BNP (last 3 results) No results for input(s): BNP in the last 8760 hours. Basic Metabolic Panel:  Recent Labs Lab 06/17/16 0113  NA 133*  K 3.8  CL 104  CO2 21*  GLUCOSE 97  BUN 14  CREATININE 0.75  CALCIUM 9.0   Liver Function Tests:  Recent Labs Lab 06/17/16 0113  AST 22  ALT 11*  ALKPHOS 52  BILITOT 0.8  PROT 6.4*  ALBUMIN 3.2*   No results for input(s): LIPASE, AMYLASE in the last 168 hours. No results for input(s): AMMONIA in the last 168 hours. CBC:  Recent Labs Lab 06/17/16 0113  WBC 7.5  HGB 10.8*  HCT 32.3*  MCV 93.6  PLT 249    Cardiac Enzymes:  Recent Labs Lab 06/17/16 0430 06/17/16 0716 06/17/16 0756  TROPONINI <0.03 <0.03 <0.03   BNP: Invalid input(s): POCBNP CBG: No results for input(s): GLUCAP in the last 168 hours. D-Dimer  Recent Labs  06/17/16 0113  DDIMER 0.42   Hgb A1c No results for input(s): HGBA1C in the last 72 hours. Lipid Profile No results for input(s): CHOL, HDL, LDLCALC, TRIG, CHOLHDL, LDLDIRECT in the last 72 hours. Thyroid function studies No results for input(s): TSH, T4TOTAL, T3FREE, THYROIDAB in the last 72 hours.  Invalid input(s): FREET3 Anemia work up No results for input(s): VITAMINB12, FOLATE, FERRITIN, TIBC, IRON, RETICCTPCT in the last 72 hours. Urinalysis    Component Value Date/Time   COLORURINE YELLOW 08/10/2011 1650  APPEARANCEUR CLOUDY (A) 08/10/2011 1650   LABSPEC 1.017 08/10/2011 1650   PHURINE 6.5 08/10/2011 1650   GLUCOSEU NEGATIVE 08/10/2011 1650   HGBUR NEGATIVE 08/10/2011 1650   BILIRUBINUR NEGATIVE 08/10/2011 1650   KETONESUR 40 (A) 08/10/2011 1650   PROTEINUR NEGATIVE 08/10/2011 1650   UROBILINOGEN 0.2 08/10/2011 1650   NITRITE NEGATIVE 08/10/2011 1650   LEUKOCYTESUR NEGATIVE 08/10/2011 1650   Sepsis Labs Invalid input(s): PROCALCITONIN,  WBC,  LACTICIDVEN Microbiology Recent Results (from the past 240 hour(s))  MRSA PCR Screening     Status: None   Collection Time: 06/17/16  5:11 AM  Result Value Ref Range Status   MRSA by PCR NEGATIVE NEGATIVE Final    Comment:        The GeneXpert MRSA Assay (FDA approved for NASAL specimens only), is one component of a comprehensive MRSA colonization surveillance program. It is not intended to diagnose MRSA infection nor to guide or monitor treatment for MRSA infections.      Time coordinating discharge: 28 minutes  SIGNED:   Rosita Fire, MD  [facility] 06/19/2016, 11:00 AM  If 7PM-7AM, please contact night-coverage [email or web address] Password [email or web address]

## 2017-04-13 ENCOUNTER — Emergency Department (HOSPITAL_COMMUNITY): Payer: Medicare Other

## 2017-04-13 ENCOUNTER — Inpatient Hospital Stay (HOSPITAL_COMMUNITY)
Admission: EM | Admit: 2017-04-13 | Discharge: 2017-04-17 | DRG: 871 | Disposition: A | Discharge status: Nursing Home (Includes ICF) | Payer: Medicare Other | Attending: Internal Medicine | Admitting: Internal Medicine

## 2017-04-13 ENCOUNTER — Encounter (HOSPITAL_COMMUNITY): Payer: Self-pay

## 2017-04-13 ENCOUNTER — Other Ambulatory Visit: Payer: Self-pay

## 2017-04-13 DIAGNOSIS — J1008 Influenza due to other identified influenza virus with other specified pneumonia: Secondary | ICD-10-CM | POA: Diagnosis present

## 2017-04-13 DIAGNOSIS — A4189 Other specified sepsis: Principal | ICD-10-CM | POA: Diagnosis present

## 2017-04-13 DIAGNOSIS — Z7983 Long term (current) use of bisphosphonates: Secondary | ICD-10-CM

## 2017-04-13 DIAGNOSIS — Z79899 Other long term (current) drug therapy: Secondary | ICD-10-CM | POA: Diagnosis not present

## 2017-04-13 DIAGNOSIS — Z87891 Personal history of nicotine dependence: Secondary | ICD-10-CM

## 2017-04-13 DIAGNOSIS — R739 Hyperglycemia, unspecified: Secondary | ICD-10-CM | POA: Diagnosis present

## 2017-04-13 DIAGNOSIS — J189 Pneumonia, unspecified organism: Secondary | ICD-10-CM

## 2017-04-13 DIAGNOSIS — E440 Moderate protein-calorie malnutrition: Secondary | ICD-10-CM | POA: Diagnosis present

## 2017-04-13 DIAGNOSIS — F0390 Unspecified dementia without behavioral disturbance: Secondary | ICD-10-CM | POA: Diagnosis present

## 2017-04-13 DIAGNOSIS — B9620 Unspecified Escherichia coli [E. coli] as the cause of diseases classified elsewhere: Secondary | ICD-10-CM | POA: Diagnosis present

## 2017-04-13 DIAGNOSIS — J181 Lobar pneumonia, unspecified organism: Secondary | ICD-10-CM

## 2017-04-13 DIAGNOSIS — Z66 Do not resuscitate: Secondary | ICD-10-CM | POA: Diagnosis present

## 2017-04-13 DIAGNOSIS — M810 Age-related osteoporosis without current pathological fracture: Secondary | ICD-10-CM | POA: Diagnosis present

## 2017-04-13 DIAGNOSIS — R509 Fever, unspecified: Secondary | ICD-10-CM | POA: Diagnosis present

## 2017-04-13 DIAGNOSIS — Z6821 Body mass index (BMI) 21.0-21.9, adult: Secondary | ICD-10-CM | POA: Diagnosis not present

## 2017-04-13 DIAGNOSIS — J101 Influenza due to other identified influenza virus with other respiratory manifestations: Secondary | ICD-10-CM | POA: Diagnosis present

## 2017-04-13 DIAGNOSIS — A419 Sepsis, unspecified organism: Secondary | ICD-10-CM | POA: Diagnosis present

## 2017-04-13 DIAGNOSIS — E785 Hyperlipidemia, unspecified: Secondary | ICD-10-CM | POA: Diagnosis present

## 2017-04-13 DIAGNOSIS — J13 Pneumonia due to Streptococcus pneumoniae: Secondary | ICD-10-CM | POA: Diagnosis present

## 2017-04-13 DIAGNOSIS — J9601 Acute respiratory failure with hypoxia: Secondary | ICD-10-CM | POA: Diagnosis present

## 2017-04-13 DIAGNOSIS — N390 Urinary tract infection, site not specified: Secondary | ICD-10-CM | POA: Diagnosis present

## 2017-04-13 LAB — URINALYSIS, ROUTINE W REFLEX MICROSCOPIC
Bilirubin Urine: NEGATIVE
GLUCOSE, UA: NEGATIVE mg/dL
Ketones, ur: 20 mg/dL — AB
NITRITE: NEGATIVE
PROTEIN: 100 mg/dL — AB
SPECIFIC GRAVITY, URINE: 1.013 (ref 1.005–1.030)
Squamous Epithelial / LPF: NONE SEEN
pH: 6 (ref 5.0–8.0)

## 2017-04-13 LAB — BRAIN NATRIURETIC PEPTIDE: B NATRIURETIC PEPTIDE 5: 67.5 pg/mL (ref 0.0–100.0)

## 2017-04-13 LAB — CBC WITH DIFFERENTIAL/PLATELET
BASOS ABS: 0 10*3/uL (ref 0.0–0.1)
BASOS PCT: 0 %
EOS ABS: 0 10*3/uL (ref 0.0–0.7)
EOS PCT: 0 %
HEMATOCRIT: 39.7 % (ref 36.0–46.0)
Hemoglobin: 12.9 g/dL (ref 12.0–15.0)
Lymphocytes Relative: 10 %
Lymphs Abs: 1.1 10*3/uL (ref 0.7–4.0)
MCH: 31.4 pg (ref 26.0–34.0)
MCHC: 32.5 g/dL (ref 30.0–36.0)
MCV: 96.6 fL (ref 78.0–100.0)
MONO ABS: 0.8 10*3/uL (ref 0.1–1.0)
MONOS PCT: 7 %
Neutro Abs: 9.7 10*3/uL — ABNORMAL HIGH (ref 1.7–7.7)
Neutrophils Relative %: 83 %
PLATELETS: 192 10*3/uL (ref 150–400)
RBC: 4.11 MIL/uL (ref 3.87–5.11)
RDW: 13.6 % (ref 11.5–15.5)
WBC: 11.7 10*3/uL — ABNORMAL HIGH (ref 4.0–10.5)

## 2017-04-13 LAB — COMPREHENSIVE METABOLIC PANEL
ALK PHOS: 44 U/L (ref 38–126)
ALT: 17 U/L (ref 14–54)
AST: 37 U/L (ref 15–41)
Albumin: 3.8 g/dL (ref 3.5–5.0)
Anion gap: 13 (ref 5–15)
BILIRUBIN TOTAL: 0.5 mg/dL (ref 0.3–1.2)
BUN: 16 mg/dL (ref 6–20)
CALCIUM: 9.5 mg/dL (ref 8.9–10.3)
CHLORIDE: 100 mmol/L — AB (ref 101–111)
CO2: 24 mmol/L (ref 22–32)
CREATININE: 0.88 mg/dL (ref 0.44–1.00)
GFR, EST NON AFRICAN AMERICAN: 57 mL/min — AB (ref >60)
Glucose, Bld: 165 mg/dL — ABNORMAL HIGH (ref 65–99)
Potassium: 3.5 mmol/L (ref 3.5–5.1)
Sodium: 137 mmol/L (ref 135–145)
TOTAL PROTEIN: 8.6 g/dL — AB (ref 6.5–8.1)

## 2017-04-13 LAB — BLOOD GAS, VENOUS
Acid-base deficit: 0.3 mmol/L (ref 0.0–2.0)
Bicarbonate: 24.2 mmol/L (ref 20.0–28.0)
O2 Saturation: 66.3 %
PCO2 VEN: 41.6 mmHg — AB (ref 44.0–60.0)
PH VEN: 7.384 (ref 7.250–7.430)
Patient temperature: 98.6
pO2, Ven: 37.5 mmHg (ref 32.0–45.0)

## 2017-04-13 LAB — I-STAT TROPONIN, ED: Troponin i, poc: 0.01 ng/mL (ref 0.00–0.08)

## 2017-04-13 LAB — I-STAT CG4 LACTIC ACID, ED
LACTIC ACID, VENOUS: 2.08 mmol/L — AB (ref 0.5–1.9)
Lactic Acid, Venous: 2.73 mmol/L (ref 0.5–1.9)

## 2017-04-13 LAB — PROCALCITONIN: PROCALCITONIN: 3.33 ng/mL

## 2017-04-13 LAB — MRSA PCR SCREENING: MRSA by PCR: NEGATIVE

## 2017-04-13 LAB — INFLUENZA PANEL BY PCR (TYPE A & B)
INFLAPCR: POSITIVE — AB
INFLBPCR: NEGATIVE

## 2017-04-13 LAB — LACTIC ACID, PLASMA: LACTIC ACID, VENOUS: 1.4 mmol/L (ref 0.5–1.9)

## 2017-04-13 MED ORDER — OSELTAMIVIR PHOSPHATE 30 MG PO CAPS
30.0000 mg | ORAL_CAPSULE | Freq: Two times a day (BID) | ORAL | Status: DC
  Administered 2017-04-14 – 2017-04-16 (×5): 30 mg via ORAL
  Filled 2017-04-13 (×6): qty 1

## 2017-04-13 MED ORDER — SODIUM CHLORIDE 0.9 % IV SOLN: 1250.0000 mg | INTRAVENOUS | Status: DC

## 2017-04-13 MED ORDER — DONEPEZIL HCL 10 MG PO TABS
10.0000 mg | ORAL_TABLET | Freq: Every day | ORAL | Status: DC
  Administered 2017-04-14 – 2017-04-16 (×3): 10 mg via ORAL
  Filled 2017-04-13 (×3): qty 1

## 2017-04-13 MED ORDER — SODIUM CHLORIDE 0.9 % IV SOLN
1.0000 g | INTRAVENOUS | Status: DC
  Administered 2017-04-13: 1 g via INTRAVENOUS
  Filled 2017-04-13: qty 1

## 2017-04-13 MED ORDER — ACETAMINOPHEN 650 MG RE SUPP
650.0000 mg | Freq: Once | RECTAL | Status: AC
  Administered 2017-04-13: 650 mg via RECTAL
  Filled 2017-04-13: qty 1

## 2017-04-13 MED ORDER — VANCOMYCIN HCL IN DEXTROSE 1-5 GM/200ML-% IV SOLN
1000.0000 mg | Freq: Once | INTRAVENOUS | Status: AC
  Administered 2017-04-13: 1000 mg via INTRAVENOUS
  Filled 2017-04-13: qty 200

## 2017-04-13 MED ORDER — ACETAMINOPHEN 325 MG PO TABS
650.0000 mg | ORAL_TABLET | Freq: Once | ORAL | Status: DC
  Filled 2017-04-13: qty 2

## 2017-04-13 MED ORDER — IPRATROPIUM-ALBUTEROL 0.5-2.5 (3) MG/3ML IN SOLN
3.0000 mL | Freq: Once | RESPIRATORY_TRACT | Status: AC
  Administered 2017-04-13: 3 mL via RESPIRATORY_TRACT
  Filled 2017-04-13: qty 3

## 2017-04-13 MED ORDER — POLYETHYLENE GLYCOL 3350 17 G PO PACK
17.0000 g | PACK | Freq: Every day | ORAL | Status: DC
  Administered 2017-04-14 – 2017-04-17 (×4): 17 g via ORAL
  Filled 2017-04-13 (×4): qty 1

## 2017-04-13 MED ORDER — FAMOTIDINE IN NACL 20-0.9 MG/50ML-% IV SOLN
20.0000 mg | Freq: Two times a day (BID) | INTRAVENOUS | Status: DC
  Administered 2017-04-13 – 2017-04-15 (×3): 20 mg via INTRAVENOUS
  Filled 2017-04-13 (×4): qty 50

## 2017-04-13 MED ORDER — PIPERACILLIN-TAZOBACTAM 3.375 G IVPB 30 MIN
3.3750 g | Freq: Once | INTRAVENOUS | Status: AC
  Administered 2017-04-13: 3.375 g via INTRAVENOUS
  Filled 2017-04-13: qty 50

## 2017-04-13 MED ORDER — ENOXAPARIN SODIUM 30 MG/0.3ML ~~LOC~~ SOLN
30.0000 mg | Freq: Every day | SUBCUTANEOUS | Status: DC
  Administered 2017-04-13 – 2017-04-16 (×4): 30 mg via SUBCUTANEOUS
  Filled 2017-04-13 (×4): qty 0.3

## 2017-04-13 MED ORDER — SODIUM CHLORIDE 0.9 % IV SOLN
INTRAVENOUS | Status: DC
  Administered 2017-04-13 – 2017-04-16 (×2): via INTRAVENOUS

## 2017-04-13 MED ORDER — SODIUM CHLORIDE 0.9 % IV BOLUS (SEPSIS)
1000.0000 mL | Freq: Once | INTRAVENOUS | Status: AC
  Administered 2017-04-13: 1000 mL via INTRAVENOUS

## 2017-04-13 MED ORDER — OSELTAMIVIR PHOSPHATE 75 MG PO CAPS
75.0000 mg | ORAL_CAPSULE | Freq: Once | ORAL | Status: DC
  Filled 2017-04-13: qty 1

## 2017-04-13 NOTE — ED Notes (Signed)
Bed: YT11 Expected date:  Expected time:  Means of arrival:  Comments: EMS-weak

## 2017-04-13 NOTE — ED Notes (Signed)
Respiratory in room.

## 2017-04-13 NOTE — ED Triage Notes (Signed)
Per EMS- Patient is a resident of of [facility]. Staff reported that the patient had a fever yesterday of 100.8 orally and was given Tamiflu. Today, the patient has weakness, altered LOC. Staff denies any N/v/d. Staff states, she is usually joking. Patient has a history of alzheimer's.

## 2017-04-13 NOTE — ED Notes (Signed)
ED TO INPATIENT HANDOFF REPORT  Name/Age/Gender Dana Bush 84 y.o. female  Code Status Code Status History    Date Active Date Inactive Code Status Order ID Comments User Context   06/17/2016 0355 06/19/2016 2145 Full Code [id number]  Norval Morton, MD ED   01/11/2011 0812 01/11/2011 1823 Full Code [id number]  Leana Gamer, MD Inpatient      Home/SNF/Other Skilled nursing facility  Chief Complaint weakness  Level of Care/Admitting Diagnosis ED Disposition    ED Disposition Condition [place]: [facility] [[id number]]  Level of Care: Stepdown [14]  Admit to SDU based on following criteria: Respiratory Distress:  Frequent assessment and/or intervention to maintain adequate ventilation/respiration, pulmonary toilet, and respiratory treatment.  Diagnosis: Sepsis due to pneumonia [facility]) [[id number]]  Admitting Physician: Karmen Bongo [[id number]]  Attending Physician: Karmen Bongo [[id number]]  Estimated length of stay: 3 - 4 days  Certification:: I certify this patient will need inpatient services for at least 2 midnights  PT Class (Do Not Modify): Inpatient [101]  PT Acc Code (Do Not Modify): Private [1]       Medical History Past Medical History:  Diagnosis Date  . Anxiety   . Compression fracture of L2 lumbar vertebra (HCC)   . Dementia   . Depression   . Hyperlipemia   . IgG monoclonal gammopathy   . Osteoporosis   . Peptic ulcer disease     Allergies No Known Allergies  IV Location/Drains/Wounds Patient Lines/Drains/Airways Status   Active Line/Drains/Airways    Name:   Placement date:   Placement time:   Site:   Days:   Peripheral IV 04/13/17 Left Antecubital   04/13/17    1402    Antecubital   less than 1   Peripheral IV 04/13/17 Right Antecubital   04/13/17    1454    Antecubital   less than 1          Labs/Imaging Results for orders placed or performed during the hospital encounter of 04/13/17 (from the  past 48 hour(s))  I-stat troponin, ED (not at [facility])     Status: None   Collection Time: 04/13/17  2:10 PM  Result Value Ref Range   Troponin i, poc 0.01 0.00 - 0.08 ng/mL   Comment 3            Comment: Due to the release kinetics of cTnI, a negative result within the first hours of the onset of symptoms does not rule out myocardial infarction with certainty. If myocardial infarction is still suspected, repeat the test at appropriate intervals.   I-Stat CG4 Lactic Acid, ED  (not at  [facility])     Status: Abnormal   Collection Time: 04/13/17  2:11 PM  Result Value Ref Range   Lactic Acid, Venous 2.73 (HH) 0.5 - 1.9 mmol/L   Comment NOTIFIED PHYSICIAN   Comprehensive metabolic panel     Status: Abnormal   Collection Time: 04/13/17  2:11 PM  Result Value Ref Range   Sodium 137 135 - 145 mmol/L   Potassium 3.5 3.5 - 5.1 mmol/L   Chloride 100 (L) 101 - 111 mmol/L   CO2 24 22 - 32 mmol/L   Glucose, Bld 165 (H) 65 - 99 mg/dL   BUN 16 6 - 20 mg/dL   Creatinine, Ser 0.88 0.44 - 1.00 mg/dL   Calcium 9.5 8.9 - 10.3 mg/dL   Total Protein 8.6 (H) 6.5 - 8.1 g/dL  Albumin 3.8 3.5 - 5.0 g/dL   AST 37 15 - 41 U/L   ALT 17 14 - 54 U/L   Alkaline Phosphatase 44 38 - 126 U/L   Total Bilirubin 0.5 0.3 - 1.2 mg/dL   GFR calc non Af Amer 57 (L) >60 mL/min   GFR calc Af Amer >60 >60 mL/min    Comment: (NOTE) The eGFR has been calculated using the CKD EPI equation. This calculation has not been validated in all clinical situations. eGFR's persistently <60 mL/min signify possible Chronic Kidney Disease.    Anion gap 13 5 - 15    Comment: Performed at [facility], [place] [address], [place], [place] [address]  Influenza panel by PCR (type A & B)     Status: Abnormal   Collection Time: 04/13/17  2:13 PM  Result Value Ref Range   Influenza A By PCR POSITIVE (A) NEGATIVE   Influenza B By PCR NEGATIVE NEGATIVE    Comment: (NOTE) The Xpert Xpress Flu assay is intended as an aid  in the diagnosis of  influenza and should not be used as a sole basis for treatment.  This  assay is FDA approved for nasopharyngeal swab specimens only. Nasal  washings and aspirates are unacceptable for Xpert Xpress Flu testing. Performed at [facility], [place] [address] [place], [place] [address]   Blood gas, venous ([facility], [facility], [facility])     Status: Abnormal   Collection Time: 04/13/17  2:25 PM  Result Value Ref Range   pH, Ven 7.384 7.250 - 7.430   pCO2, Ven 41.6 (L) 44.0 - 60.0 mmHg   pO2, Ven 37.5 32.0 - 45.0 mmHg   Bicarbonate 24.2 20.0 - 28.0 mmol/L   Acid-base deficit 0.3 0.0 - 2.0 mmol/L   O2 Saturation 66.3 %   Patient temperature 98.6    Collection site VEIN    Drawn by COLLECTED BY LABORATORY    Sample type VEIN     Comment: Performed at [place] [address], [place], [place] [address]  Urinalysis, Routine w reflex microscopic     Status: Abnormal   Collection Time: 04/13/17  2:45 PM  Result Value Ref Range   Color, Urine YELLOW YELLOW   APPearance HAZY (A) CLEAR   Specific Gravity, Urine 1.013 1.005 - 1.030   pH 6.0 5.0 - 8.0   Glucose, UA NEGATIVE NEGATIVE mg/dL   Hgb urine dipstick LARGE (A) NEGATIVE   Bilirubin Urine NEGATIVE NEGATIVE   Ketones, ur 20 (A) NEGATIVE mg/dL   Protein, ur 100 (A) NEGATIVE mg/dL   Nitrite NEGATIVE NEGATIVE   Leukocytes, UA SMALL (A) NEGATIVE   RBC / HPF 0-5 0 - 5 RBC/hpf   WBC, UA 6-30 0 - 5 WBC/hpf   Bacteria, UA MANY (A) NONE SEEN   Squamous Epithelial / LPF NONE SEEN NONE SEEN   Mucus PRESENT     Comment: Performed at [facility], [place] [address], [place], [place] [address]  CBC with Differential/Platelet     Status: Abnormal   Collection Time: 04/13/17  3:00 PM  Result Value Ref Range   WBC 11.7 (H) 4.0 - 10.5 K/uL   RBC 4.11 3.87 - 5.11 MIL/uL   Hemoglobin 12.9 12.0 - 15.0 g/dL   HCT 39.7 36.0 - 46.0 %   MCV 96.6 78.0 - 100.0 fL   MCH 31.4 26.0 - 34.0 pg   MCHC  32.5 30.0 - 36.0 g/dL   RDW  13.6 11.5 - 15.5 %   Platelets 192 150 - 400 K/uL   Neutrophils Relative % 83 %   Neutro Abs 9.7 (H) 1.7 - 7.7 K/uL   Lymphocytes Relative 10 %   Lymphs Abs 1.1 0.7 - 4.0 K/uL   Monocytes Relative 7 %   Monocytes Absolute 0.8 0.1 - 1.0 K/uL   Eosinophils Relative 0 %   Eosinophils Absolute 0.0 0.0 - 0.7 K/uL   Basophils Relative 0 %   Basophils Absolute 0.0 0.0 - 0.1 K/uL    Comment: Performed at [facility], [place] [address] [place], [place] [address]  Brain natriuretic peptide     Status: None   Collection Time: 04/13/17  3:00 PM  Result Value Ref Range   B Natriuretic Peptide 67.5 0.0 - 100.0 pg/mL    Comment: Performed at [facility], [place] [address], [place], [place] [address]  I-Stat CG4 Lactic Acid, ED  (not at  [facility])     Status: Abnormal   Collection Time: 04/13/17  5:19 PM  Result Value Ref Range   Lactic Acid, Venous 2.08 (HH) 0.5 - 1.9 mmol/L   Comment NOTIFIED PHYSICIAN    Dg Chest 2 View  Result Date: 04/13/2017 CLINICAL DATA:  Weakness and shortness of breath EXAM: CHEST - 2 VIEW COMPARISON:  Chest radiograph 06/17/2016 FINDINGS: Shallow lung inflation with left basilar consolidation or atelectasis. No pleural effusion or pneumothorax. Multiple chronic thoracic compression fractures. Slight progression of height loss of the most superior fracture. IMPRESSION: 1. Left basilar consolidation and/or atelectasis. 2. Multiple chronic thoracic compression deformities. Slight progression of height loss at the more superior midthoracic compression deformity. Electronically Signed   By: Ulyses Jarred M.D.   On: 04/13/2017 15:47    Pending Labs Unresulted Labs (From admission, onward)   Start     Ordered   04/13/17 2031  MRSA PCR Screening  Once,   R     04/13/17 2030   04/13/17 1318  CBC WITH DIFFERENTIAL  STAT,   STAT     04/13/17 1318   04/13/17 1318  Blood Culture (routine x 2)  BLOOD CULTURE X 2,   STAT      04/13/17 1318   04/13/17 1318  Urine culture  STAT,   STAT     04/13/17 1318   Signed and Held  Lactic acid, plasma  STAT Now then every 3 hours,   STAT     Signed and Held   Signed and Held  Procalcitonin  STAT,   R     Signed and Held   Signed and Held  Culture, sputum-assessment  Once,   R    Question:  Patient immune status  Answer:  Normal   Signed and Held   Signed and Held  Gram stain  Once,   R    Question:  Patient immune status  Answer:  Normal   Signed and Held   Signed and Held  Strep pneumoniae urinary antigen  Once,   R     Signed and Held   Visual merchandiser and Held  Basic metabolic panel  Tomorrow morning,   R     Signed and Held   Signed and Held  CBC WITH DIFFERENTIAL  Tomorrow morning,   R     Signed and Held      Vitals/Pain Today's Vitals   04/13/17 1830 04/13/17 1900 04/13/17 1950 04/13/17 2000  BP: (!) 105/53 (!) 100/55 (!) 100/53   Pulse:  72 74 75   Resp: (!) 21 (!) 21 (!) 28   Temp:      TempSrc:      SpO2: 96% 97% 90%   Weight:      Height:    '5\' 2"'  (1.575 m)  PainSc:        Isolation Precautions Droplet precaution  Medications Medications  ceFEPIme (MAXIPIME) 1 g in sodium chloride 0.9 % 100 mL IVPB (has no administration in time range)  vancomycin (VANCOCIN) 1,250 mg in sodium chloride 0.9 % 250 mL IVPB (has no administration in time range)  oseltamivir (TAMIFLU) capsule 30 mg (has no administration in time range)  piperacillin-tazobactam (ZOSYN) IVPB 3.375 g (0 g Intravenous Stopped 04/13/17 1517)  vancomycin (VANCOCIN) IVPB 1000 mg/200 mL premix (0 mg Intravenous Stopped 04/13/17 1618)  sodium chloride 0.9 % bolus 1,000 mL (0 mLs Intravenous Stopped 04/13/17 1518)  ipratropium-albuterol (DUONEB) 0.5-2.5 (3) MG/3ML nebulizer solution 3 mL (3 mLs Nebulization Given 04/13/17 1641)  acetaminophen (TYLENOL) suppository 650 mg (650 mg Rectal Given 04/13/17 1735)    Mobility non-ambulatory

## 2017-04-13 NOTE — ED Notes (Signed)
Patient placed on droplet precautions.

## 2017-04-13 NOTE — ED Provider Notes (Addendum)
[facility] DEPT Provider Note   CSN: [id number] Arrival date & time: 04/13/17  1219     History   Chief Complaint Chief Complaint  Patient presents with  . Fever  . Weakness  . Altered Mental Status    HPI Dana Bush is a 84 y.o. female with history of hyperlipidemia, dementia here from [facility] for evaluation of fever and change in behavior noted yesterday by facility staff. History is limited due to baseline dementia, patient able to tell me her name but cannot follow commands.  She denies pain anywhere. Level V caveat applies.  HPI  Past Medical History:  Diagnosis Date  . Anxiety   . Compression fracture of L2 lumbar vertebra (HCC)   . Dementia   . Depression   . Hyperlipemia   . IgG monoclonal gammopathy   . Osteoporosis   . Peptic ulcer disease     Patient Active Problem List   Diagnosis Date Noted  . Chest pain 06/17/2016  . Left leg swelling 06/17/2016  . Drug overdose, accidental or unintentional, initial encounter 06/17/2016  . Malnutrition of mild degree ([place]) 01/09/2011  . Dizziness 01/09/2011  . Compression fracture of L1 lumbar vertebra ([place]) 01/05/2011  . Fall 01/05/2011  . Hyponatremia 01/05/2011  . Osteoporosis 01/05/2011    Past Surgical History:  Procedure Laterality Date  . cervical polyp removal    . DILATION AND CURETTAGE OF UTERUS    . TONSILLECTOMY      OB History   None      Home Medications    Prior to Admission medications   Medication Sig Start Date End Date Taking? Authorizing Provider  alendronate (FOSAMAX) 70 MG tablet Take 70 mg by mouth every Sunday. Take with a full glass of water on an empty stomach.    Yes [provider]  Calcium Carb-Cholecalciferol 600-400 MG-UNIT TABS Take 1 tablet by mouth 2 (two) times daily.   Yes [provider]  donepezil (ARICEPT) 10 MG tablet Take 10 mg by mouth at bedtime.   Yes [provider]  HYDROcodone-acetaminophen  (NORCO/VICODIN) 5-325 MG per tablet Take 1 tablet by mouth every 8 (eight) hours as needed for pain. 10/15/12  Yes Varney Biles, MD  oseltamivir (TAMIFLU) 75 MG capsule Take 75 mg by mouth 2 (two) times daily. Started 03/21 for 7 days   Yes [provider]  polyethylene glycol (MIRALAX / GLYCOLAX) packet Take 17 g by mouth daily.   Yes [provider]  ranitidine (ZANTAC) 150 MG tablet Take 150 mg by mouth at bedtime.   Yes [provider]  clonazePAM (KLONOPIN) 0.25 MG disintegrating tablet Take 1 tablet (0.25 mg total) by mouth 2 (two) times daily as needed (anxiety). Patient not taking: Reported on 04/13/2017 06/19/16   Rosita Fire, MD  pantoprazole (PROTONIX) 40 MG tablet Take 1 tablet (40 mg total) by mouth daily. Patient not taking: Reported on 04/13/2017 06/19/16   Rosita Fire, MD    Family History No family history on file.  Social History Social History   Tobacco Use  . Smoking status: Former Smoker    Packs/day: 1.00  . Smokeless tobacco: Never Used  Substance Use Topics  . Alcohol use: No  . Drug use: No     Allergies   Patient has no known allergies.   Review of Systems Review of Systems  Unable to perform ROS: Dementia (ROS unreliable given h/o dementia)  Constitutional: Positive for activity change and fever.  Physical Exam Updated Vital Signs BP (!) 119/53   Pulse 97   Temp (!) 103.6 F (39.8 C) (Rectal)   Resp (!) 26   Wt 54.4 kg (120 lb)   SpO2 95%   BMI 21.95 kg/m   Physical Exam  Constitutional: She appears well-developed and well-nourished. She appears lethargic. No distress.  Somnolent but alert to voice.   HENT:  Head: Normocephalic and atraumatic.  Nose: Nose normal.  Dry MM. Gurgling with inspiration to back of throat. Controlling oral secretions.   Eyes: Pupils are equal, round, and reactive to light. Conjunctivae and EOM are normal.  Neck: Normal range of motion.  Cardiovascular:  Regular rhythm and intact distal pulses. Tachycardia present.  No murmur heard. 2+ DP and radial pulses bilaterally. No LE edema.   Pulmonary/Chest: Effort normal. Tachypnea noted. She has decreased breath sounds in the right middle field and the left middle field. She has no wheezes. She has rales in the right lower field and the left lower field.  Hypoxic requiring 2L [place]. RR 25-30 at rest. Speaking in one word sentences.   Abdominal: Soft. Bowel sounds are normal. There is no tenderness.  No G/R/R. No suprapubic tenderness.   Musculoskeletal: Normal range of motion. She exhibits no deformity.  Neurological: She appears lethargic. She is disoriented.  Constricted by reactive pupils, symmetric. Alert to voice, oriented to self only. Appears to spontaneously move all four extremities during examination. Cannot follow simple commands.   Skin: Skin is warm and dry. Capillary refill takes less than 2 seconds.  Psychiatric: She has a normal mood and affect. Her behavior is normal. Judgment and thought content normal.  Nursing note and vitals reviewed.    ED Treatments / Results  Labs (all labs ordered are listed, but only abnormal results are displayed) Labs Reviewed  COMPREHENSIVE METABOLIC PANEL - Abnormal; Notable for the following components:      Result Value   Chloride 100 (*)    Glucose, Bld 165 (*)    Total Protein 8.6 (*)    GFR calc non Af Amer 57 (*)    All other components within normal limits  URINALYSIS, ROUTINE W REFLEX MICROSCOPIC - Abnormal; Notable for the following components:   APPearance HAZY (*)    Hgb urine dipstick LARGE (*)    Ketones, ur 20 (*)    Protein, ur 100 (*)    Leukocytes, UA SMALL (*)    Bacteria, UA MANY (*)    All other components within normal limits  BLOOD GAS, VENOUS - Abnormal; Notable for the following components:   pCO2, Ven 41.6 (*)    All other components within normal limits  INFLUENZA PANEL BY PCR (TYPE A & B) - Abnormal; Notable for  the following components:   Influenza A By PCR POSITIVE (*)    All other components within normal limits  CBC WITH DIFFERENTIAL/PLATELET - Abnormal; Notable for the following components:   WBC 11.7 (*)    Neutro Abs 9.7 (*)    All other components within normal limits  I-STAT CG4 LACTIC ACID, ED - Abnormal; Notable for the following components:   Lactic Acid, Venous 2.73 (*)    All other components within normal limits  CULTURE, BLOOD (ROUTINE X 2)  CULTURE, BLOOD (ROUTINE X 2)  URINE CULTURE  BRAIN NATRIURETIC PEPTIDE  CBC WITH DIFFERENTIAL/PLATELET  I-STAT TROPONIN, ED  I-STAT CG4 LACTIC ACID, ED    EKG  EKG Interpretation None       Radiology  Dg Chest 2 View  Result Date: 04/13/2017 CLINICAL DATA:  Weakness and shortness of breath EXAM: CHEST - 2 VIEW COMPARISON:  Chest radiograph 06/17/2016 FINDINGS: Shallow lung inflation with left basilar consolidation or atelectasis. No pleural effusion or pneumothorax. Multiple chronic thoracic compression fractures. Slight progression of height loss of the most superior fracture. IMPRESSION: 1. Left basilar consolidation and/or atelectasis. 2. Multiple chronic thoracic compression deformities. Slight progression of height loss at the more superior midthoracic compression deformity. Electronically Signed   By: Ulyses Jarred M.D.   On: 04/13/2017 15:47    Procedures Procedures (including critical care time)  CRITICAL CARE Performed by: Kinnie Feil   Total critical care time: 30 minutes  Critical care time was exclusive of separately billable procedures and treating other patients.  Critical care was necessary to treat or prevent imminent or life-threatening deterioration.  Critical care was time spent personally by me on the following activities: development of treatment plan with patient and/or surrogate as well as nursing, discussions with consultants, evaluation of patient's response to treatment, examination of patient,  obtaining history from patient or surrogate, ordering and performing treatments and interventions, ordering and review of laboratory studies, ordering and review of radiographic studies, pulse oximetry and re-evaluation of patient's condition.  Medications Ordered in ED Medications  acetaminophen (TYLENOL) tablet 650 mg (has no administration in time range)  piperacillin-tazobactam (ZOSYN) IVPB 3.375 g (0 g Intravenous Stopped 04/13/17 1517)  vancomycin (VANCOCIN) IVPB 1000 mg/200 mL premix (0 mg Intravenous Stopped 04/13/17 1618)  sodium chloride 0.9 % bolus 1,000 mL (0 mLs Intravenous Stopped 04/13/17 1518)  ipratropium-albuterol (DUONEB) 0.5-2.5 (3) MG/3ML nebulizer solution 3 mL (3 mLs Nebulization Given 04/13/17 1641)     Initial Impression / Assessment and Plan / ED Course  I have reviewed the triage vital signs and the nursing notes.  Pertinent labs & imaging results that were available during my care of the patient were reviewed by me and considered in my medical decision making (see chart for details).  Clinical Course as of Apr 13 1701  Thu Apr 13, 2017  1437 Lactic Acid, Venous(!!): 2.73 [CG]  1530 Re-evaluated pt, RN placed on NR due to hypoxia on 6L [place] at 1400.    [CG]  1615 IMPRESSION: 1. Left basilar consolidation and/or atelectasis. 2. Multiple chronic thoracic compression deformities. Slight progression of height loss at the more superior midthoracic compression deformity.  DG Chest 2 View [CG]  1648 B Natriuretic Peptide: 67.5 [CG]  1648 WBC(!): 11.7 [CG]    Clinical Course User Index [CG] Kinnie Feil, PA-C   1330: patient meets SIRS criteria with fever, tachycardia, tachypnea and the oxygen requirements. Abnormal lung sounds, respiratory etiology, and differential. Code sepsis activated, pending labs. Broad spectrum antibiotics and IV fluid boluses ordered. Last EF normal.  1645: patient has required transition from 6 L nasal cannula to nonrebreather, will  request BiPAP. Chest x-ray as above shows consolidation. BNP within normal limits. We'll request admission to stepdown unit for sepsis 2/2 community-acquired pneumonia. Patient shared with Dr. Ellender Hose. Attempting to contact family for code status.   1700: Spoke to patient's son Armya Westerhoff [facility]) [phone] who states he knows mother's wishes and she would not want intubation or chest compressions. No DNR paperwork at bedside.   Final Clinical Impressions(s) / ED Diagnoses   Final diagnoses:  Community acquired pneumonia of left lower lobe of lung [facility])    ED Discharge Orders    None  Kinnie Feil, PA-C 04/13/17 1703    Duffy Bruce, MD 04/13/17 [phone]

## 2017-04-13 NOTE — ED Notes (Signed)
Dana Bush Yow/daughter [phone]) [phone] (cell)

## 2017-04-13 NOTE — H&P (Signed)
History and Physical    Dana Bush KZS:[id number] DOB: [birth year] DOA: 04/13/2017  PCP: Deland Pretty, MD  Patient coming from: [place]; NOK: Dana Bush, son, [phone] (of note, there is family strife and there may be disagreement about patient wishes, but he is the current POA)  Chief Complaint:  Fever, weakness, AMS  HPI: Dana Bush is a 84 y.o. female with medical history significant of dementia, depression, and IgG monoclonal gammopathy presenting with AMS.   The patient has dementia and is on BIPAP and is unable to provide information. Her daughter was present at the bedside and also did not provide significant history.   ED Course:  Sepsis from PNA.  Lives at [facility], noted to have behavioral changes.  SIRS criteria, +PNA on CXR.  Given broad-spectrum abx and IVF.  6L -> NRB -> BIPAP.   Son reports she is DNR.  Review of Systems: Unable to perform  PMH, PSH, SH, and FH reviewed in Epic but unable to review with patient  Past Medical History:  Diagnosis Date  . Anxiety   . Compression fracture of L2 lumbar vertebra (HCC)   . Dementia   . Depression   . Hyperlipemia   . IgG monoclonal gammopathy   . Osteoporosis   . Peptic ulcer disease     Past Surgical History:  Procedure Laterality Date  . cervical polyp removal    . DILATION AND CURETTAGE OF UTERUS    . TONSILLECTOMY      Social History   Socioeconomic History  . Marital status: Single    Spouse name: Not on file  . Number of children: Not on file  . Years of education: Not on file  . Highest education level: Not on file  Occupational History  . Not on file  Social Needs  . Financial resource strain: Not on file  . Food insecurity:    Worry: Not on file    Inability: Not on file  . Transportation needs:    Medical: Not on file    Non-medical: Not on file  Tobacco Use  . Smoking status: Former Smoker    Packs/day: 1.00  . Smokeless tobacco: Never  Used  Substance and Sexual Activity  . Alcohol use: No  . Drug use: No  . Sexual activity: Never  Lifestyle  . Physical activity:    Days per week: Not on file    Minutes per session: Not on file  . Stress: Not on file  Relationships  . Social connections:    Talks on phone: Not on file    Gets together: Not on file    Attends religious service: Not on file    Active member of club or organization: Not on file    Attends meetings of clubs or organizations: Not on file    Relationship status: Not on file  . Intimate partner violence:    Fear of current or ex partner: Not on file    Emotionally abused: Not on file    Physically abused: Not on file    Forced sexual activity: Not on file  Other Topics Concern  . Not on file  Social History Narrative  . Not on file    No Known Allergies  No family history on file.  Prior to Admission medications   Medication Sig Start Date End Date Taking? Authorizing Provider  alendronate (FOSAMAX) 70 MG tablet Take 70 mg by mouth every Sunday. Take with a full glass of  water on an empty stomach.    Yes [provider]  Calcium Carb-Cholecalciferol 600-400 MG-UNIT TABS Take 1 tablet by mouth 2 (two) times daily.   Yes [provider]  donepezil (ARICEPT) 10 MG tablet Take 10 mg by mouth at bedtime.   Yes [provider]  HYDROcodone-acetaminophen (NORCO/VICODIN) 5-325 MG per tablet Take 1 tablet by mouth every 8 (eight) hours as needed for pain. 10/15/12  Yes Varney Biles, MD  oseltamivir (TAMIFLU) 75 MG capsule Take 75 mg by mouth 2 (two) times daily. Started 03/21 for 7 days   Yes [provider]  polyethylene glycol (MIRALAX / GLYCOLAX) packet Take 17 g by mouth daily.   Yes [provider]  ranitidine (ZANTAC) 150 MG tablet Take 150 mg by mouth at bedtime.   Yes [provider]  clonazePAM (KLONOPIN) 0.25 MG disintegrating tablet Take 1 tablet (0.25 mg total) by mouth 2 (two) times  daily as needed (anxiety). Patient not taking: Reported on 04/13/2017 06/19/16   Rosita Fire, MD  pantoprazole (PROTONIX) 40 MG tablet Take 1 tablet (40 mg total) by mouth daily. Patient not taking: Reported on 04/13/2017 06/19/16   Rosita Fire, MD    Physical Exam: Vitals:   04/13/17 2000 04/13/17 2222 04/13/17 2238 04/13/17 2330  BP:  108/66    Pulse:   73   Resp:   (!) 22   Temp:  (!) 101 F (38.3 C)  99.2 F (37.3 C)  TempSrc:  Rectal  Axillary  SpO2:   93%   Weight:  57.4 kg (126 lb 8.7 oz)    Height: 5\' 2"  (1.575 m) 5\' 2"  (1.575 m)       General:  Appears calm and comfortable and is NAD, on BIPAP Eyes:  PERRL, EOMI, normal lids, iris ENT:  Hard of hearing, on BIPAP Neck:  no LAD, masses or thyromegaly Cardiovascular:  RRR, no m/r/g. No LE edema.  Respiratory:   CTA bilaterally with no wheezes/rales/rhonchi.  Mildly increased respiratory effort. Abdomen:  soft, NT, ND, NABS Skin:  no rash or induration seen on limited exam Musculoskeletal:  grossly normal tone BUE/BLE, good ROM, no bony abnormality Lower extremity:  No LE edema.  Limited foot exam with no ulcerations.  2+ distal pulses. Psychiatric:  Opens eyes to voice, denies pain Neurologic:  Unable to assess    Radiological Exams on Admission: Dg Chest 2 View  Result Date: 04/13/2017 CLINICAL DATA:  Weakness and shortness of breath EXAM: CHEST - 2 VIEW COMPARISON:  Chest radiograph 06/17/2016 FINDINGS: Shallow lung inflation with left basilar consolidation or atelectasis. No pleural effusion or pneumothorax. Multiple chronic thoracic compression fractures. Slight progression of height loss of the most superior fracture. IMPRESSION: 1. Left basilar consolidation and/or atelectasis. 2. Multiple chronic thoracic compression deformities. Slight progression of height loss at the more superior midthoracic compression deformity. Electronically Signed   By: Ulyses Jarred M.D.   On: 04/13/2017 15:47    EKG:  Independently reviewed.  NSR with rate 88; nonspecific ST changes with no evidence of acute ischemia   Labs on Admission: I have personally reviewed the available labs and imaging studies at the time of the admission.  Pertinent labs:   Lactate 2.73, 2.08 VBG: 7.384/41.6/37.5/24.2 WBC 11.7 UA: large hgb, 20 ketones, 100 protein, many bacteria Influenza A positive BNP 67.5 Glucose 165 Troponin 0.01 Blood and urine cultures pending  Assessment/Plan Principal Problem:   Sepsis due to pneumonia [facility]) Active Problems:   Influenza A  Dementia   Hyperglycemia   Sepsis due to PNA, influenza -Elevated WBC count, fever, tachycardia, tachypnea, and hypoxia with elevated lactate and borderline hypotension -While awaiting blood cultures, this appears to be a preseptic condition. -Sepsis protocol initiated -Patient tested positive for influenza A; it appears that she was started on Tamiflu at her facility on 3/21 but h/o influenza was not reported at the time of presentation -Given fever to 103, decreased oxygen saturation requiring BIPAP, and infiltrate in left lower lobe on chest x-ray, most likely pneumonia.  -There is debate about whether assisted living-associated infection constitutes HCAP; given the severity of her infection, if it reasonable to treat it as such. -Pneumonia Severity Index (PSI) is Class 4, 9% mortality. -Corticosteroids have been to shown to low overall mortality rate; risk of ARDS; and need for mechanical ventilation.  This is particularly true in severe PNA (class 3+ PSI).  Will add Solumedrol 60 mg IV BID. -Will treat with Cefepime and Vancomycin. -Continue BIPAP, but will not escalate -NS @ 75cc/hr -Fever control -Repeat CBC in am -Sputum cultures -Strep pneumo testing -albuterol PRN -Will admit to SDU -Procalcitonin is >3.  Antibiotics would not be indicated for PCT <0.1 and probably should not be used for < 0.25.  >0.5 indicates infection and >>0.5  indicates more serious disease.  As the procalcitonin level normalizes, it will be reasonable to consider de-escalation of antibiotic coverage.  Dementia -Continue Aricept  Hyperglycemia -May be stress response -Will follow with fasting AM labs -It is unlikely that she will need acute or chronic treatment for this issue  DVT prophylaxis:  Lovenox Code Status: DNR - confirmed with HCPOA by ER PA Family Communication: Daughter present throughout evaluation  Disposition Plan: To be determined Consults called: None Admission status: Admit - It is my clinical opinion that admission to INPATIENT is reasonable and necessary because of the expectation that this patient will require hospital care that crosses at least 2 midnights to treat this condition based on the medical complexity of the problems presented.  Given the aforementioned information, the predictability of an adverse outcome is felt to be significant.    Karmen Bongo MD [facility]  If note is complete, please contact covering daytime or nighttime physician. [email or web address] Password [facility]  04/14/2017, 12:56 AM

## 2017-04-13 NOTE — Progress Notes (Signed)
A consult was received from an ED physician for vancomycin and zosyn per pharmacy dosing.  The patient's profile has been reviewed for ht/wt/allergies/indication/available labs.   A one time order has been placed for vancomycin 1g and zosyn 3.375g.    Further antibiotics/pharmacy consults should be ordered by admitting physician if indicated.                       Thank you, Peggyann Juba, PharmD, BCPS 04/13/2017  1:45 PM

## 2017-04-13 NOTE — ED Notes (Signed)
Patient placed on a NRB. Sats 85 on O2 6L/min via [place]. sats increased to 98% while on NRB

## 2017-04-13 NOTE — Progress Notes (Addendum)
Pharmacy Antibiotic Note  Dana Bush is a 84 y.o. female admitted on 04/13/2017 with pneumonia.  Pharmacy has been consulted for Vancomycin & Cefepime dosing. Initial doses started in ED.   Plan: Cefepime 1gm IV q24h Vancomycin 1250mg  IV q48h Change Tamiflu to 30mg  po BID for CrCl 30-47ml/min Check Vancomycin levels as needed Monitor renal function and cx data  Consider check MRSA PCR  Height: 5\' 2"  (157.5 cm) Weight: 120 lb (54.4 kg) IBW/kg (Calculated) : 50.1  Temp (24hrs), Avg:102 F (38.9 C), Min:100.3 F (37.9 C), Max:103.6 F (39.8 C)  Recent Labs  Lab 04/13/17 1411 04/13/17 1500 04/13/17 1719  WBC  --  11.7*  --   CREATININE 0.88  --   --   LATICACIDVEN 2.73*  --  2.08*    Estimated Creatinine Clearance: 34.9 mL/min (by C-G formula based on SCr of 0.88 mg/dL).    No Known Allergies  Antimicrobials this admission: 3/21 Zosyn x1 in ED 3/21 Cefepime >>  3/21 Vanc >>  3/21 Tamiflu>>  Dose adjustments this admission:  Microbiology results: 3/21 BCx:  3/21 UCx:   3/21 Influenza PCR: A+ MRSA PCR:   Thank you for allowing pharmacy to be a part of this patient's care.  Biagio Borg 04/13/2017 8:13 PM

## 2017-04-14 DIAGNOSIS — F0390 Unspecified dementia without behavioral disturbance: Secondary | ICD-10-CM | POA: Diagnosis present

## 2017-04-14 DIAGNOSIS — R739 Hyperglycemia, unspecified: Secondary | ICD-10-CM | POA: Diagnosis present

## 2017-04-14 DIAGNOSIS — E440 Moderate protein-calorie malnutrition: Secondary | ICD-10-CM

## 2017-04-14 DIAGNOSIS — J101 Influenza due to other identified influenza virus with other respiratory manifestations: Secondary | ICD-10-CM | POA: Diagnosis present

## 2017-04-14 DIAGNOSIS — J181 Lobar pneumonia, unspecified organism: Secondary | ICD-10-CM

## 2017-04-14 DIAGNOSIS — N390 Urinary tract infection, site not specified: Secondary | ICD-10-CM

## 2017-04-14 LAB — LACTIC ACID, PLASMA: LACTIC ACID, VENOUS: 1.2 mmol/L (ref 0.5–1.9)

## 2017-04-14 LAB — CBC WITH DIFFERENTIAL/PLATELET
BASOS PCT: 0 %
Basophils Absolute: 0 10*3/uL (ref 0.0–0.1)
EOS ABS: 0 10*3/uL (ref 0.0–0.7)
Eosinophils Relative: 0 %
HEMATOCRIT: 36.7 % (ref 36.0–46.0)
HEMOGLOBIN: 12.2 g/dL (ref 12.0–15.0)
Lymphocytes Relative: 7 %
Lymphs Abs: 0.8 10*3/uL (ref 0.7–4.0)
MCH: 31.2 pg (ref 26.0–34.0)
MCHC: 33.2 g/dL (ref 30.0–36.0)
MCV: 93.9 fL (ref 78.0–100.0)
Monocytes Absolute: 0.8 10*3/uL (ref 0.1–1.0)
Monocytes Relative: 7 %
NEUTROS ABS: 9.9 10*3/uL — AB (ref 1.7–7.7)
NEUTROS PCT: 86 %
PLATELETS: 164 10*3/uL (ref 150–400)
RBC: 3.91 MIL/uL (ref 3.87–5.11)
RDW: 13.5 % (ref 11.5–15.5)
WBC: 11.5 10*3/uL — ABNORMAL HIGH (ref 4.0–10.5)

## 2017-04-14 LAB — BASIC METABOLIC PANEL
ANION GAP: 11 (ref 5–15)
BUN: 18 mg/dL (ref 6–20)
CALCIUM: 8.5 mg/dL — AB (ref 8.9–10.3)
CO2: 20 mmol/L — ABNORMAL LOW (ref 22–32)
CREATININE: 0.69 mg/dL (ref 0.44–1.00)
Chloride: 109 mmol/L (ref 101–111)
Glucose, Bld: 143 mg/dL — ABNORMAL HIGH (ref 65–99)
Potassium: 2.8 mmol/L — ABNORMAL LOW (ref 3.5–5.1)
Sodium: 140 mmol/L (ref 135–145)

## 2017-04-14 LAB — STREP PNEUMONIAE URINARY ANTIGEN: Strep Pneumo Urinary Antigen: POSITIVE — AB

## 2017-04-14 MED ORDER — ADULT MULTIVITAMIN W/MINERALS CH
1.0000 | ORAL_TABLET | Freq: Every day | ORAL | Status: DC
  Administered 2017-04-14 – 2017-04-17 (×4): 1 via ORAL
  Filled 2017-04-14 (×4): qty 1

## 2017-04-14 MED ORDER — POTASSIUM CHLORIDE CRYS ER 20 MEQ PO TBCR
40.0000 meq | EXTENDED_RELEASE_TABLET | ORAL | Status: AC
  Administered 2017-04-14 (×3): 40 meq via ORAL
  Filled 2017-04-14 (×3): qty 2

## 2017-04-14 MED ORDER — ALBUTEROL SULFATE (2.5 MG/3ML) 0.083% IN NEBU
2.5000 mg | INHALATION_SOLUTION | RESPIRATORY_TRACT | Status: DC | PRN
  Administered 2017-04-15: 2.5 mg via RESPIRATORY_TRACT
  Filled 2017-04-14: qty 3

## 2017-04-14 MED ORDER — SODIUM CHLORIDE 0.9 % IV SOLN
1.0000 g | Freq: Two times a day (BID) | INTRAVENOUS | Status: DC
  Administered 2017-04-14 (×2): 1 g via INTRAVENOUS
  Filled 2017-04-14 (×3): qty 1

## 2017-04-14 MED ORDER — METHYLPREDNISOLONE SODIUM SUCC 125 MG IJ SOLR
60.0000 mg | Freq: Two times a day (BID) | INTRAMUSCULAR | Status: DC
  Administered 2017-04-14 – 2017-04-15 (×5): 60 mg via INTRAVENOUS
  Filled 2017-04-14 (×5): qty 2

## 2017-04-14 NOTE — Progress Notes (Addendum)
Pharmacy Antibiotic Note  Dana Bush is a 84 y.o. female admitted on 04/13/2017 with pneumonia.  Pharmacy has been consulted for Cefepime dosing. 04/14/2017  MRSA PCR neg and Strep Ag positive, Flu A positive. Urine and blood cultures pending.  WBC 11.5, AF since admission T max 103.6   Plan: Increase Cefepime to 1gm IV q12h Dc vancomycin Continue tamiflu 30mg  po BID for CrCl 30-58ml/min F/u cx results: may be able to de-escalate to Rocephin  Height: 5\' 2"  (157.5 cm) Weight: 126 lb 8.7 oz (57.4 kg) IBW/kg (Calculated) : 50.1  Temp (24hrs), Avg:99.9 F (37.7 C), Min:97.3 F (36.3 C), Max:103.6 F (39.8 C)  Recent Labs  Lab 04/13/17 1411 04/13/17 1500 04/13/17 1719 04/13/17 2250 04/14/17 0341  WBC  --  11.7*  --   --  11.5*  CREATININE 0.88  --   --   --  0.69  LATICACIDVEN 2.73*  --  2.08* 1.4 1.2    Estimated Creatinine Clearance: 38.4 mL/min (by C-G formula based on SCr of 0.69 mg/dL).    No Known Allergies  Antimicrobials this admission:  3/21 Vanc >>3/22 3/21 Zosyn x 1 3/21 Cefepime>> 3/21 Tamiflu>>  Dose adjustments this admission:  3/22 incr cefepime 1 gm q12  Microbiology results:  3/21 BCx: sent 3/21 UCx: sent 3/21 Influenza panel: +Influenza A 3/21 MRSA PCR: neg 3/21 strep pneum Uag > positive  Thank you for allowing pharmacy to be a part of this patient's care. Eudelia Bunch, Pharm.D. [phone] 04/14/2017 11:06 AM

## 2017-04-14 NOTE — Progress Notes (Signed)
PROGRESS NOTE    Dana Bush  SNK:[id number] DOB: [birth year] DOA: 04/13/2017 PCP: Deland Pretty, MD  Outpatient Specialists:   Brief Narrative:  Patient is an 84 y.o. female, resident in an assisted living facility, with medical history significant of dementia, depression, and IgG monoclonal gammopathy.  Patient was admitted with fever, weakness and altered mental status.  Patient is still not able to give any significant history.  Patient was initially on BiPAP, but not of BiPAP.  Urine Streptococcus pneumonia has come back positive.  Influenza A by PCR is also positive.  Chest x-ray abdominal CT revealed left basilar consolidation.  No sauce swab for MRSA is negative.  Will discontinue vancomycin.  We will continue cefepime for now.  Urinalysis revealed many bacteria, large hemoglobin, ketones of 20 and urine specific gravity of 1.013.  Urine culture is growing E. coli.  Potassium of 2.8 is noted.  Assessment & Plan:   Principal Problem:   Sepsis due to pneumonia [facility]) Active Problems:   Influenza A   Dementia   Hyperglycemia  Sepsis due to PNA, influenza A, and UTI sepsis secondary toE. coli: -Elevated WBC count, fever, tachycardia, tachypnea, and hypoxia with elevated lactate and borderline hypotension -While awaiting blood cultures, this appears to be a preseptic condition. -Sepsis protocol initiated -Patient tested positive for influenza A; it appears that she was started on Tamiflu at her facility on 3/21 but h/o influenza was not reported at the time of presentation -Given fever to 103, decreased oxygen saturation requiring BIPAP, and infiltrate in left lower lobe on chest x-ray, most likely pneumonia. -There is debate about whether assisted living-associated infection constitutes HCAP; given the severity of her infection, if it reasonable to treat it as such. -Pneumonia Severity Index (PSI) is Class 4, 9% mortality. -Corticosteroids have been to shown to low overall  mortality rate; risk of ARDS; and need for mechanical ventilation.  This is particularly true in severe PNA (class 3+ PSI).  Will add Solumedrol 60 mg IV BID. -Will treat with Cefepime and Vancomycin. -Continue BIPAP, but will not escalate -NS @ 75cc/hr -Fever control -Repeat CBC in am -Sputum cultures -Strep pneumo testing -albuterol PRN -Will admit to SDU -Procalcitonin is >3.  Antibiotics would not be indicated for PCT <0.1 and probably should not be used for < 0.25.  >0.5 indicates infection and >>0.5 indicates more serious disease.  As the procalcitonin level normalizes, it will be reasonable to consider de-escalation of antibiotic coverage.  04/14/2017-follow final cultures.  Discontinue vancomycin.  Continue cefepime.  Pneumonia, likely secondary to Streptococcus pneumonia: -Continue cefepime. -Continue other supportive care.  UTI sepsis secondary to E. coli: -Follow final cultures. -Continue cefepime.  Influenza A: -Continue Tamiflu 30 and the p.o. twice daily.  Complete course.  Dementia -Continue Aricept  Hyperglycemia -May be stress response -Will follow with fasting AM labs -It is unlikely that she will need acute or chronic treatment for this issue  DVT prophylaxis:  Lovenox Code Status: DNR - confirmed with HCPOA by ER PA Family Communication: Daughter present throughout evaluation  Disposition Plan: To be determined none  Consultants:   None  Procedures:   None discontinue  Antimicrobials:   Discontinue vancomycin.  Continue cefepime.  Continue Tamiflu for influenza A.   Subjective: Patient is unable to give any significant history.  Objective: Vitals:   04/14/17 0116 04/14/17 0328 04/14/17 0341 04/14/17 0509  BP: 115/87   (!) 96/49  Pulse: 83  86 91  Resp: (!) 21  12 19  Temp:  98.1 F (36.7 C)    TempSrc:  Axillary    SpO2: 93%  96% 94%  Weight:      Height:        Intake/Output Summary (Last 24 hours) at 04/14/2017 0803 Last  data filed at 04/14/2017 4403 Gross per 24 hour  Intake 1926.25 ml  Output -  Net 1926.25 ml   Filed Weights   04/13/17 1258 04/13/17 2222  Weight: 54.4 kg (120 lb) 57.4 kg (126 lb 8.7 oz)    Examination:  General exam: Patient is awake, Respiratory system: Decreased air entry.  Cardiovascular system: S1 & S2.  Mild edema of the ankle.   Gastrointestinal system: Abdomen is nondistended, soft and nontender. No organomegaly or masses felt. Normal bowel sounds heard. Central nervous system: Awake, moves all limbs.  Patient is demented.   Extremities: Symmetric 5 x 5 power.  Mild pedal edema.   Data Reviewed: I have personally reviewed following labs and imaging studies  CBC: Recent Labs  Lab 04/13/17 1500 04/14/17 0341  WBC 11.7* 11.5*  NEUTROABS 9.7* 9.9*  HGB 12.9 12.2  HCT 39.7 36.7  MCV 96.6 93.9  PLT 192 474   Basic Metabolic Panel: Recent Labs  Lab 04/13/17 1411 04/14/17 0341  NA 137 140  K 3.5 2.8*  CL 100* 109  CO2 24 20*  GLUCOSE 165* 143*  BUN 16 18  CREATININE 0.88 0.69  CALCIUM 9.5 8.5*   GFR: Estimated Creatinine Clearance: 38.4 mL/min (by C-G formula based on SCr of 0.69 mg/dL). Liver Function Tests: Recent Labs  Lab 04/13/17 1411  AST 37  ALT 17  ALKPHOS 44  BILITOT 0.5  PROT 8.6*  ALBUMIN 3.8   No results for input(s): LIPASE, AMYLASE in the last 168 hours. No results for input(s): AMMONIA in the last 168 hours. Coagulation Profile: No results for input(s): INR, PROTIME in the last 168 hours. Cardiac Enzymes: No results for input(s): CKTOTAL, CKMB, CKMBINDEX, TROPONINI in the last 168 hours. BNP (last 3 results) No results for input(s): PROBNP in the last 8760 hours. HbA1C: No results for input(s): HGBA1C in the last 72 hours. CBG: No results for input(s): GLUCAP in the last 168 hours. Lipid Profile: No results for input(s): CHOL, HDL, LDLCALC, TRIG, CHOLHDL, LDLDIRECT in the last 72 hours. Thyroid Function Tests: No results for  input(s): TSH, T4TOTAL, FREET4, T3FREE, THYROIDAB in the last 72 hours. Anemia Panel: No results for input(s): VITAMINB12, FOLATE, FERRITIN, TIBC, IRON, RETICCTPCT in the last 72 hours. Urine analysis:    Component Value Date/Time   COLORURINE YELLOW 04/13/2017 1445   APPEARANCEUR HAZY (A) 04/13/2017 1445   LABSPEC 1.013 04/13/2017 1445   PHURINE 6.0 04/13/2017 1445   GLUCOSEU NEGATIVE 04/13/2017 1445   HGBUR LARGE (A) 04/13/2017 1445   BILIRUBINUR NEGATIVE 04/13/2017 1445   KETONESUR 20 (A) 04/13/2017 1445   PROTEINUR 100 (A) 04/13/2017 1445   UROBILINOGEN 0.2 08/10/2011 1650   NITRITE NEGATIVE 04/13/2017 1445   LEUKOCYTESUR SMALL (A) 04/13/2017 1445   Sepsis Labs: @LABRCNTIP (procalcitonin:4,lacticidven:4)  ) Recent Results (from the past 240 hour(s))  MRSA PCR Screening     Status: None   Collection Time: 04/13/17 10:30 PM  Result Value Ref Range Status   MRSA by PCR NEGATIVE NEGATIVE Final    Comment:        The GeneXpert MRSA Assay (FDA approved for NASAL specimens only), is one component of a comprehensive MRSA colonization surveillance program. It is not intended to diagnose MRSA infection  nor to guide or monitor treatment for MRSA infections. Performed at [facility], [place] [address] [place], [place] [address]          Radiology Studies: Dg Chest 2 View  Result Date: 04/13/2017 CLINICAL DATA:  Weakness and shortness of breath EXAM: CHEST - 2 VIEW COMPARISON:  Chest radiograph 06/17/2016 FINDINGS: Shallow lung inflation with left basilar consolidation or atelectasis. No pleural effusion or pneumothorax. Multiple chronic thoracic compression fractures. Slight progression of height loss of the most superior fracture. IMPRESSION: 1. Left basilar consolidation and/or atelectasis. 2. Multiple chronic thoracic compression deformities. Slight progression of height loss at the more superior midthoracic compression deformity. Electronically Signed    By: Ulyses Jarred M.D.   On: 04/13/2017 15:47        Scheduled Meds: . donepezil  10 mg Oral QHS  . enoxaparin (LOVENOX) injection  30 mg Subcutaneous QHS  . methylPREDNISolone (SOLU-MEDROL) injection  60 mg Intravenous Q12H  . oseltamivir  30 mg Oral BID  . polyethylene glycol  17 g Oral Daily  . potassium chloride  40 mEq Oral Q4H   Continuous Infusions: . sodium chloride 75 mL/hr at 04/13/17 2251  . ceFEPime (MAXIPIME) IV Stopped (04/13/17 2350)  . famotidine (PEPCID) IV Stopped (04/13/17 2351)  . [START ON 04/15/2017] vancomycin       LOS: 1 day    Time spent: 35 minutes    Dana Allan, MD  [facility] Pager #: [phone] 7PM-7AM contact night coverage as above

## 2017-04-14 NOTE — Progress Notes (Signed)
Initial Nutrition Assessment  DOCUMENTATION CODES:   Non-severe (moderate) malnutrition in context of chronic illness  INTERVENTION:   - Once diet advanced, RD to order Ensure Enlive po BID, each supplement provides 350 kcal and 20 grams of protein  - MVI with minerals  NUTRITION DIAGNOSIS:   Moderate Malnutrition related to chronic illness(dementia) as evidenced by mild fat depletion, moderate muscle depletion.  GOAL:   Patient will meet greater than or equal to 90% of their needs  MONITOR:   Diet advancement, Labs, Skin  REASON FOR ASSESSMENT:   Low Braden   ASSESSMENT:   84 year old female who is a resident of [name]. Pt presented to ED with fever, SOB, and weakness. Pt with PMH significant for HLD, dementia, anxiety, depression, and osteoporosis. Pt admitted for treatment sepsis due to PNA and influenza.  Spoke with pt at bedside. Student RN entering room to give pt medications.  Pt is a poor historian regarding intake and appetite over past several weeks and no family present at time of visit. Pt states she usually eats 3 meals daily. Breakfast usually consists of raisin bran cereal with milk. Lunch is some type of sandwich. Dinner varies.  Pt denies any issues chewing or swallowing.  Pt states her UBW is 114 lbs and that she does not know if she has lost weight. RD noted admission weight 6 lbs higher than reported UBW.  Medications reviewed and include: Miralax daily, 40 mEq potassium chloride every 4 hours, 20 mg IV Pepcid  Labs reviewed: potassium 2.8  NUTRITION - FOCUSED PHYSICAL EXAM:    Most Recent Value  Orbital Region  No depletion  Upper Arm Region  Mild depletion  Thoracic and Lumbar Region  Mild depletion  Buccal Region  No depletion  Temple Region  Mild depletion  Clavicle Bone Region  Mild depletion  Clavicle and Acromion Bone Region  Mild depletion  Scapular Bone Region  Unable to assess  Dorsal Hand  Mild depletion  Patellar Region   Moderate depletion  Anterior Thigh Region  Moderate depletion  Posterior Calf Region  Moderate depletion  Edema (RD Assessment)  None  Hair  Reviewed  Eyes  Reviewed  Mouth  Reviewed  Skin  Reviewed  Nails  Reviewed       Diet Order:  Diet NPO time specified  EDUCATION NEEDS:   Not appropriate for education at this time  Skin:  Skin Assessment: Reviewed RN Assessment(scattered ecchymosis)  Last BM:  04/13/17  Height:   Ht Readings from Last 1 Encounters:  04/13/17 5\' 2"  (1.575 m)    Weight:   Wt Readings from Last 1 Encounters:  04/13/17 126 lb 8.7 oz (57.4 kg)    Ideal Body Weight:  50 kg  BMI:  Body mass index is 23.15 kg/m.  Estimated Nutritional Needs:   Kcal:  1400-1600 kcal/day  Protein:  70-85 grams/day  Fluid:  1.4-1.6 L/day    Gaynell Face, MS, RD, LDN Pager: [phone] Weekend/After Hours: [phone]

## 2017-04-15 DIAGNOSIS — J189 Pneumonia, unspecified organism: Secondary | ICD-10-CM

## 2017-04-15 DIAGNOSIS — A419 Sepsis, unspecified organism: Secondary | ICD-10-CM

## 2017-04-15 LAB — CBC WITH DIFFERENTIAL/PLATELET
Basophils Absolute: 0 10*3/uL (ref 0.0–0.1)
Basophils Relative: 0 %
Eosinophils Absolute: 0 10*3/uL (ref 0.0–0.7)
Eosinophils Relative: 0 %
HCT: 34.7 % — ABNORMAL LOW (ref 36.0–46.0)
Hemoglobin: 11.4 g/dL — ABNORMAL LOW (ref 12.0–15.0)
Lymphocytes Relative: 7 %
Lymphs Abs: 1 10*3/uL (ref 0.7–4.0)
MCH: 31.2 pg (ref 26.0–34.0)
MCHC: 32.9 g/dL (ref 30.0–36.0)
MCV: 95.1 fL (ref 78.0–100.0)
Monocytes Absolute: 0.6 10*3/uL (ref 0.1–1.0)
Monocytes Relative: 4 %
Neutro Abs: 11.8 10*3/uL — ABNORMAL HIGH (ref 1.7–7.7)
Neutrophils Relative %: 89 %
Platelets: 168 10*3/uL (ref 150–400)
RBC: 3.65 MIL/uL — ABNORMAL LOW (ref 3.87–5.11)
RDW: 13.9 % (ref 11.5–15.5)
WBC: 13.3 10*3/uL — ABNORMAL HIGH (ref 4.0–10.5)

## 2017-04-15 LAB — RENAL FUNCTION PANEL
Albumin: 2.6 g/dL — ABNORMAL LOW (ref 3.5–5.0)
Anion gap: 6 (ref 5–15)
BUN: 25 mg/dL — ABNORMAL HIGH (ref 6–20)
CO2: 19 mmol/L — ABNORMAL LOW (ref 22–32)
Calcium: 8.4 mg/dL — ABNORMAL LOW (ref 8.9–10.3)
Chloride: 114 mmol/L — ABNORMAL HIGH (ref 101–111)
Creatinine, Ser: 0.68 mg/dL (ref 0.44–1.00)
GFR calc Af Amer: >60 mL/min (ref >60)
GFR calc non Af Amer: >60 mL/min (ref >60)
Glucose, Bld: 132 mg/dL — ABNORMAL HIGH (ref 65–99)
Phosphorus: 1.1 mg/dL — ABNORMAL LOW (ref 2.5–4.6)
Potassium: 4.6 mmol/L (ref 3.5–5.1)
Sodium: 139 mmol/L (ref 135–145)

## 2017-04-15 LAB — URINE CULTURE

## 2017-04-15 LAB — MAGNESIUM: Magnesium: 2.1 mg/dL (ref 1.7–2.4)

## 2017-04-15 MED ORDER — HALOPERIDOL LACTATE 5 MG/ML IJ SOLN
5.0000 mg | Freq: Four times a day (QID) | INTRAMUSCULAR | Status: DC | PRN
  Administered 2017-04-15: 5 mg via INTRAVENOUS
  Filled 2017-04-15: qty 1

## 2017-04-15 MED ORDER — FAMOTIDINE 20 MG PO TABS
20.0000 mg | ORAL_TABLET | Freq: Two times a day (BID) | ORAL | Status: DC
Start: 2017-04-15 — End: 2017-04-17
  Administered 2017-04-15 – 2017-04-17 (×5): 20 mg via ORAL
  Filled 2017-04-15 (×5): qty 1

## 2017-04-15 MED ORDER — ALBUTEROL SULFATE (2.5 MG/3ML) 0.083% IN NEBU
2.5000 mg | INHALATION_SOLUTION | Freq: Three times a day (TID) | RESPIRATORY_TRACT | Status: DC
Start: 2017-04-16 — End: 2017-04-16
  Administered 2017-04-16: 2.5 mg via RESPIRATORY_TRACT
  Filled 2017-04-15: qty 3

## 2017-04-15 MED ORDER — K PHOS MONO-SOD PHOS DI & MONO 155-852-130 MG PO TABS
250.0000 mg | ORAL_TABLET | Freq: Three times a day (TID) | ORAL | Status: AC
  Administered 2017-04-15 (×3): 250 mg via ORAL
  Filled 2017-04-15 (×3): qty 1

## 2017-04-15 MED ORDER — ALBUTEROL SULFATE (2.5 MG/3ML) 0.083% IN NEBU: 2.5000 mg | INHALATION_SOLUTION | Freq: Four times a day (QID) | RESPIRATORY_TRACT | Status: DC | PRN

## 2017-04-15 MED ORDER — SODIUM PHOSPHATES 45 MMOLE/15ML IV SOLN
20.0000 mmol | Freq: Once | INTRAVENOUS | Status: AC
  Administered 2017-04-15: 20 mmol via INTRAVENOUS
  Filled 2017-04-15: qty 6.67

## 2017-04-15 MED ORDER — CEFTRIAXONE SODIUM 2 G IJ SOLR
2.0000 g | INTRAMUSCULAR | Status: DC
  Administered 2017-04-15 – 2017-04-16 (×2): 2 g via INTRAVENOUS
  Filled 2017-04-15 (×2): qty 2
  Filled 2017-04-15: qty 20

## 2017-04-15 NOTE — Progress Notes (Signed)
PHARMACIST - PHYSICIAN COMMUNICATION  CONCERNING: IV to Oral Route Change Policy  RECOMMENDATION: This patient is receiving famotidine by the intravenous route.  Based on criteria approved by the Pharmacy and Therapeutics Committee, the intravenous medication(s) is/are being converted to the equivalent oral dose form(s).   DESCRIPTION: These criteria include:  The patient is eating (either orally or via tube) and/or has been taking other orally administered medications for a least 24 hours  The patient has no evidence of active gastrointestinal bleeding or impaired GI absorption (gastrectomy, short bowel, patient on TNA or NPO).  If you have questions about this conversion, please contact the Pharmacy Department  []   [phone] )  [facility] []   [phone] )  [facility] []   [phone] )  [facility] []   [phone] )  [facility] [x]   [phone] )  Albertine Grates, Pharm.D. [phone] 04/15/2017 10:01 AM

## 2017-04-15 NOTE — Progress Notes (Signed)
Report called and given to [place], rn on 4th floor and pt transferred to 1436. Left unit in bed pushed by this Probation officer and nurse tech. Arrived in stable condition. [place] Bogus.

## 2017-04-15 NOTE — Progress Notes (Signed)
PROGRESS NOTE    Dana Bush  LKG:[id number] DOB: Nov 12, 1926 DOA: 04/13/2017 PCP: Deland Pretty, MD    Brief Narrative:  84 year old with past medical history relevant for dementia, osteoporosis, IgG monoclonal gammopathy admitted from assisted living with altered mental status, fever and acute hypoxic respiratory failure and found to have influenza A complicated by likely Streptococcus pneumonia.   Assessment & Plan:   Principal Problem:   Sepsis due to pneumonia [facility]) Active Problems:   Influenza A   Dementia   Hyperglycemia   Malnutrition of moderate degree   #) Sepsis due to superimposed Streptococcus pneumonia and influenza A: Patient is clinically improving with diminished oxygen requirements.  Interestingly enough her urine antigen turn positive for Streptococcus.  Her mental status is unclear however appears to have improved somewhat. -Continue ceftriaxone 2 g daily -Continue oseltamivir 75 mg twice daily -Continue IV fluids -Follow-up blood cultures from 04/13/2017  #) Possible UTI: Urine culture growing pansensitive E. coli -Covered by ceftriaxone  #) Dementia: -Continue donepezil 10 mg nightly  #) Osteoporosis: -Hold alendronate 70 mg every Sunday -Hold calcium carbonate/cholecalciferol tablets twice daily  Fluids: IV fluids Elect lites: Monitor and supplement Nutrition: Regular diet  Prophylaxis: Inox apparent  Disposition: Pending further improvement in respiratory status and physical therapy evaluation to see if assisted living is appropriate  DO NOT RESUSCITATE   Consultants:   None  Procedures: (Don't include imaging studies which can be auto populated. Include things that cannot be auto populated i.e. Echo, Carotid and venous dopplers, Foley, Bipap, HD, tubes/drains, wound vac, central lines etc)  None  Antimicrobials: (specify start and planned stop date. Auto populated tables are space occupying and do not give end dates)  IV  ceftriaxone started 03/18/2017  IV cefepime continued 04/13/2017 to 04/15/2017   Subjective: Patient reports that she is doing much better.  She is eating and breakfast.  She reports some cough and shortness of breath however much improved.  Objective: Vitals:   04/15/17 0400 04/15/17 0747 04/15/17 0800 04/15/17 1105  BP: (!) 145/54  106/70   Pulse: 66  83   Resp: 18  (!) 25   Temp: 97.8 F (36.6 C) (!) 97.3 F (36.3 C)  (!) 97.5 F (36.4 C)  TempSrc: Oral Oral  Oral  SpO2: 95%     Weight:      Height:        Intake/Output Summary (Last 24 hours) at 04/15/2017 1219 Last data filed at 04/15/2017 0400 Gross per 24 hour  Intake 2125 ml  Output 600 ml  Net 1525 ml   Filed Weights   04/13/17 1258 04/13/17 2222  Weight: 54.4 kg (120 lb) 57.4 kg (126 lb 8.7 oz)    Examination:  General exam: Appears calm and comfortable  Respiratory system: No increased work of breathing, crackles at bases, no wheezes or rhonchi Cardiovascular system: Distant heart sounds, regular rate and rhythm Gastrointestinal system: Abdomen is nondistended, soft and nontender. No organomegaly or masses felt. Normal bowel sounds heard. Central nervous system: Alert and oriented to person, place, situation but not to time, no focal neurological deficits Extremities: No lower extremity edema Skin: No rashes on visible skin Psychiatry: Judgement and insight appear normal. Mood & affect appropriate.     Data Reviewed: I have personally reviewed following labs and imaging studies  CBC: Recent Labs  Lab 04/13/17 1500 04/14/17 0341 04/15/17 0321  WBC 11.7* 11.5* 13.3*  NEUTROABS 9.7* 9.9* 11.8*  HGB 12.9 12.2 11.4*  HCT 39.7 36.7  34.7*  MCV 96.6 93.9 95.1  PLT 192 164 742   Basic Metabolic Panel: Recent Labs  Lab 04/13/17 1411 04/14/17 0341 04/15/17 0321  NA 137 140 139  K 3.5 2.8* 4.6  CL 100* 109 114*  CO2 24 20* 19*  GLUCOSE 165* 143* 132*  BUN 16 18 25*  CREATININE 0.88 0.69 0.68    CALCIUM 9.5 8.5* 8.4*  MG  --   --  2.1  PHOS  --   --  1.1*   GFR: Estimated Creatinine Clearance: 38.4 mL/min (by C-G formula based on SCr of 0.68 mg/dL). Liver Function Tests: Recent Labs  Lab 04/13/17 1411 04/15/17 0321  AST 37  --   ALT 17  --   ALKPHOS 44  --   BILITOT 0.5  --   PROT 8.6*  --   ALBUMIN 3.8 2.6*   No results for input(s): LIPASE, AMYLASE in the last 168 hours. No results for input(s): AMMONIA in the last 168 hours. Coagulation Profile: No results for input(s): INR, PROTIME in the last 168 hours. Cardiac Enzymes: No results for input(s): CKTOTAL, CKMB, CKMBINDEX, TROPONINI in the last 168 hours. BNP (last 3 results) No results for input(s): PROBNP in the last 8760 hours. HbA1C: No results for input(s): HGBA1C in the last 72 hours. CBG: No results for input(s): GLUCAP in the last 168 hours. Lipid Profile: No results for input(s): CHOL, HDL, LDLCALC, TRIG, CHOLHDL, LDLDIRECT in the last 72 hours. Thyroid Function Tests: No results for input(s): TSH, T4TOTAL, FREET4, T3FREE, THYROIDAB in the last 72 hours. Anemia Panel: No results for input(s): VITAMINB12, FOLATE, FERRITIN, TIBC, IRON, RETICCTPCT in the last 72 hours. Sepsis Labs: Recent Labs  Lab 04/13/17 1411 04/13/17 1719 04/13/17 2250 04/14/17 0341  PROCALCITON  --   --  3.33  --   LATICACIDVEN 2.73* 2.08* 1.4 1.2    Recent Results (from the past 240 hour(s))  Blood Culture (routine x 2)     Status: None (Preliminary result)   Collection Time: 04/13/17  2:12 PM  Result Value Ref Range Status   Specimen Description   Final    BLOOD LEFT ANTECUBITAL Performed at [facility] [address], [place], [place] [address]    Special Requests   Final    BOTTLES DRAWN AEROBIC AND ANAEROBIC Blood Culture adequate volume Performed at [facility] [address], [place], [place] [address]    Culture   Final    NO GROWTH < 24 HOURS Performed at [facility] [address] [place], [place] [address]    Report Status PENDING  Incomplete  Blood Culture (routine x 2)     Status: None (Preliminary result)   Collection Time: 04/13/17  2:13 PM  Result Value Ref Range Status   Specimen Description   Final    BLOOD RIGHT ANTECUBITAL Performed at [facility] [address], [place], [place] [address]    Special Requests   Final    BOTTLES DRAWN AEROBIC ONLY Blood Culture results may not be optimal due to an inadequate volume of blood received in culture bottles Performed at [facility] [address] [place], [place] [address]    Culture   Final    NO GROWTH < 24 HOURS Performed at [facility] [address], [place], [place] [address]    Report Status PENDING  Incomplete  Urine culture     Status: Abnormal   Collection  Time: 04/13/17  2:45 PM  Result Value Ref Range Status   Specimen Description   Final    URINE, RANDOM Performed at [facility] [address], [place], [place] [address]    Special Requests   Final    NONE Performed at [facility], [place] [address], [place], [place] [address]    Culture >=100,000 COLONIES/mL ESCHERICHIA COLI (A)  Final   Report Status 04/15/2017 FINAL  Final   Organism ID, Bacteria ESCHERICHIA COLI (A)  Final      Susceptibility   Escherichia coli - MIC*    AMPICILLIN 4 SENSITIVE Sensitive     CEFAZOLIN <=4 SENSITIVE Sensitive     CEFTRIAXONE <=1 SENSITIVE Sensitive     CIPROFLOXACIN <=0.25 SENSITIVE Sensitive     GENTAMICIN <=1 SENSITIVE Sensitive     IMIPENEM <=0.25 SENSITIVE Sensitive     NITROFURANTOIN <=16 SENSITIVE Sensitive     TRIMETH/SULFA <=20 SENSITIVE Sensitive     AMPICILLIN/SULBACTAM <=2 SENSITIVE Sensitive     PIP/TAZO <=4 SENSITIVE Sensitive     Extended ESBL NEGATIVE Sensitive     * >=100,000 COLONIES/mL ESCHERICHIA COLI  MRSA PCR Screening     Status: None   Collection Time:  04/13/17 10:30 PM  Result Value Ref Range Status   MRSA by PCR NEGATIVE NEGATIVE Final    Comment:        The GeneXpert MRSA Assay (FDA approved for NASAL specimens only), is one component of a comprehensive MRSA colonization surveillance program. It is not intended to diagnose MRSA infection nor to guide or monitor treatment for MRSA infections. Performed at [facility], [place] [address], [place], [place] [address]          Radiology Studies: Dg Chest 2 View  Result Date: 04/13/2017 CLINICAL DATA:  Weakness and shortness of breath EXAM: CHEST - 2 VIEW COMPARISON:  Chest radiograph 06/17/2016 FINDINGS: Shallow lung inflation with left basilar consolidation or atelectasis. No pleural effusion or pneumothorax. Multiple chronic thoracic compression fractures. Slight progression of height loss of the most superior fracture. IMPRESSION: 1. Left basilar consolidation and/or atelectasis. 2. Multiple chronic thoracic compression deformities. Slight progression of height loss at the more superior midthoracic compression deformity. Electronically Signed   By: Ulyses Jarred M.D.   On: 04/13/2017 15:47        Scheduled Meds: . donepezil  10 mg Oral QHS  . enoxaparin (LOVENOX) injection  30 mg Subcutaneous QHS  . famotidine  20 mg Oral BID  . methylPREDNISolone (SOLU-MEDROL) injection  60 mg Intravenous Q12H  . multivitamin with minerals  1 tablet Oral Daily  . oseltamivir  30 mg Oral BID  . phosphorus  250 mg Oral Q8H  . polyethylene glycol  17 g Oral Daily   Continuous Infusions: . sodium chloride 75 mL/hr at 04/13/17 2251  . cefTRIAXone (ROCEPHIN)  IV    . sodium phosphate  Dextrose 5% IVPB 20 mmol (04/15/17 0842)     LOS: 2 days    Time spent: [place], MD [facility]  If 7PM-7AM, please contact night-coverage [email or web address] Password [facility] 04/15/2017, 12:19 PM

## 2017-04-15 NOTE — Progress Notes (Signed)
Patient transferred from ICU, VSS, tele applied. Agree with previous RN assessment. Denies any needs at this time. Will continue to monitor.  

## 2017-04-16 LAB — RENAL FUNCTION PANEL
Albumin: 2.5 g/dL — ABNORMAL LOW (ref 3.5–5.0)
Anion gap: 8 (ref 5–15)
BUN: 20 mg/dL (ref 6–20)
CO2: 21 mmol/L — ABNORMAL LOW (ref 22–32)
Calcium: 7.8 mg/dL — ABNORMAL LOW (ref 8.9–10.3)
Chloride: 112 mmol/L — ABNORMAL HIGH (ref 101–111)
Creatinine, Ser: 0.63 mg/dL (ref 0.44–1.00)
GFR calc Af Amer: >60 mL/min (ref >60)
GFR calc non Af Amer: >60 mL/min (ref >60)
Glucose, Bld: 146 mg/dL — ABNORMAL HIGH (ref 65–99)
Phosphorus: 2.4 mg/dL — ABNORMAL LOW (ref 2.5–4.6)
Potassium: 3.5 mmol/L (ref 3.5–5.1)
Sodium: 141 mmol/L (ref 135–145)

## 2017-04-16 LAB — CBC
HCT: 33.1 % — ABNORMAL LOW (ref 36.0–46.0)
Hemoglobin: 11 g/dL — ABNORMAL LOW (ref 12.0–15.0)
MCH: 31 pg (ref 26.0–34.0)
MCHC: 33.2 g/dL (ref 30.0–36.0)
MCV: 93.2 fL (ref 78.0–100.0)
RBC: 3.55 MIL/uL — ABNORMAL LOW (ref 3.87–5.11)
RDW: 13.7 % (ref 11.5–15.5)
WBC: 8.1 10*3/uL (ref 4.0–10.5)

## 2017-04-16 MED ORDER — OSELTAMIVIR PHOSPHATE 30 MG PO CAPS
30.0000 mg | ORAL_CAPSULE | Freq: Two times a day (BID) | ORAL | Status: DC
  Administered 2017-04-16 – 2017-04-17 (×2): 30 mg via ORAL
  Filled 2017-04-16 (×2): qty 1

## 2017-04-16 MED ORDER — PREDNISONE 20 MG PO TABS
40.0000 mg | ORAL_TABLET | Freq: Every day | ORAL | Status: DC
  Administered 2017-04-16 – 2017-04-17 (×2): 40 mg via ORAL
  Filled 2017-04-16 (×2): qty 2

## 2017-04-16 MED ORDER — OSELTAMIVIR PHOSPHATE 75 MG PO CAPS: 75.0000 mg | ORAL_CAPSULE | Freq: Two times a day (BID) | ORAL | Status: DC

## 2017-04-16 MED ORDER — IPRATROPIUM-ALBUTEROL 0.5-2.5 (3) MG/3ML IN SOLN
3.0000 mL | Freq: Four times a day (QID) | RESPIRATORY_TRACT | Status: DC
  Administered 2017-04-16 – 2017-04-17 (×4): 3 mL via RESPIRATORY_TRACT
  Filled 2017-04-16 (×4): qty 3

## 2017-04-16 MED ORDER — IPRATROPIUM-ALBUTEROL 0.5-2.5 (3) MG/3ML IN SOLN: 3.0000 mL | Freq: Two times a day (BID) | RESPIRATORY_TRACT | Status: DC

## 2017-04-16 NOTE — NC FL2 (Addendum)
[place] MEDICAID FL2 LEVEL OF CARE SCREENING TOOL     IDENTIFICATION  Patient Name: Dana Bush Birthdate: 1926/05/04 Sex: female Admission Date (Current Location): 04/13/2017  [facility] and [place] Number:  Herbalist and Address:  [facility],  [place] [address], [place]      Provider Number: [id number]  Attending Physician Name and Address:  Cristy Folks, MD  Relative Name and Phone Number:       Current Level of Care: Hospital Recommended Level of Care: [place] Prior Approval Number:    Date Approved/Denied:   PASRR Number:    Discharge Plan: Other (Comment)(ALF)    Current Diagnoses: Patient Active Problem List   Diagnosis Date Noted  . Influenza A 04/14/2017  . Dementia 04/14/2017  . Hyperglycemia 04/14/2017  . Malnutrition of moderate degree 04/14/2017  . Sepsis due to pneumonia ([place]) 04/13/2017  . Chest pain 06/17/2016  . Left leg swelling 06/17/2016  . Drug overdose, accidental or unintentional, initial encounter 06/17/2016  . Malnutrition of mild degree ([place]) 01/09/2011  . Dizziness 01/09/2011  . Compression fracture of L1 lumbar vertebra ([place]) 01/05/2011  . Fall 01/05/2011  . Hyponatremia 01/05/2011  . Osteoporosis 01/05/2011    Orientation RESPIRATION BLADDER Height & Weight     Self  normal Incontinent Weight: 131 lb 6.3 oz (59.6 kg) Height:  5\' 2"  (157.5 cm)  BEHAVIORAL SYMPTOMS/MOOD NEUROLOGICAL BOWEL NUTRITION STATUS      Continent Diet(no added salt)  AMBULATORY STATUS COMMUNICATION OF NEEDS Skin   Extensive Assist Verbally Normal                       Personal Care Assistance Level of Assistance  Bathing, Feeding, Dressing Bathing Assistance: Maximum assistance Feeding assistance: Limited assistance Dressing Assistance: Maximum assistance     Functional Limitations Info  Sight, Hearing, Speech Sight Info: Adequate Hearing Info: Adequate Speech Info: Adequate     SPECIAL CARE FACTORS FREQUENCY  PT (By licensed PT), OT (By licensed OT)     PT Frequency: 3x/wk with home health OT Frequency: 3x/wk with home health            Contractures Contractures Info: Not present    Additional Factors Info  Code Status, Allergies, Isolation Precautions Code Status Info: DNR Allergies Info: NKA     Isolation Precautions Info: Droplet precautions     Current Medications (04/16/2017):  This is the current hospital active medication list Current Facility-Administered Medications  Medication Dose Route Frequency Provider Last Rate Last Dose  . albuterol (PROVENTIL) (2.5 MG/3ML) 0.083% nebulizer solution 2.5 mg  2.5 mg Nebulization Q6H PRN Purohit, Shrey C, MD      . cefTRIAXone (ROCEPHIN) 2 g in sodium chloride 0.9 % 100 mL IVPB  2 g Intravenous Q24H Purohit, Konrad Dolores, MD   Stopped at 04/16/17 1313  . donepezil (ARICEPT) tablet 10 mg  10 mg Oral Ivery Quale, MD   10 mg at 04/15/17 2118  . enoxaparin (LOVENOX) injection 30 mg  30 mg Subcutaneous QHS Karmen Bongo, MD   30 mg at 04/15/17 2115  . famotidine (PEPCID) tablet 20 mg  20 mg Oral BID Eudelia Bunch, RPH   20 mg at 04/16/17 1053  . haloperidol lactate (HALDOL) injection 5 mg  5 mg Intravenous Q6H PRN Vertis Kelch, NP   5 mg at 04/15/17 2112  . ipratropium-albuterol (DUONEB) 0.5-2.5 (3) MG/3ML nebulizer solution 3 mL  3 mL  Nebulization BID Purohit, Shrey C, MD      . multivitamin with minerals tablet 1 tablet  1 tablet Oral Daily Dana Allan I, MD   1 tablet at 04/16/17 1053  . oseltamivir (TAMIFLU) capsule 75 mg  75 mg Oral BID Purohit, Shrey C, MD      . polyethylene glycol (MIRALAX / GLYCOLAX) packet 17 g  17 g Oral Daily Karmen Bongo, MD   17 g at 04/16/17 1053  . predniSONE (DELTASONE) tablet 40 mg  40 mg Oral Q breakfast Purohit, Shrey C, MD   40 mg at 04/16/17 1053     Discharge Medications: TAKE these medications   albuterol 108 (90 Base) MCG/ACT  inhaler Commonly known as:  PROVENTIL HFA;VENTOLIN HFA Inhale 2 puffs into the lungs every 6 (six) hours as needed for wheezing or shortness of breath.   alendronate 70 MG tablet Commonly known as:  FOSAMAX Take 70 mg by mouth every Sunday. Take with a full glass of water on an empty stomach.   Calcium Carb-Cholecalciferol 600-400 MG-UNIT Tabs Take 1 tablet by mouth 2 (two) times daily.   cefdinir 300 MG capsule Commonly known as:  OMNICEF Take 1 capsule (300 mg total) by mouth 2 (two) times daily for 4 days. Stop taking on 04/21/2017  donepezil 10 MG tablet Commonly known as:  ARICEPT Take 10 mg by mouth at bedtime.   HYDROcodone-acetaminophen 5-325 MG tablet Commonly known as:  NORCO/VICODIN Take 1 tablet by mouth every 8 (eight) hours as needed for pain.   oseltamivir 30 MG capsule Commonly known as:  TAMIFLU Take 1 capsule (30 mg total) by mouth 2 (two) times daily for 2 days. What changed:    medication strength  how much to take  additional instructions Stop taking on 04/19/2017   polyethylene glycol packet Commonly known as:  MIRALAX / GLYCOLAX Take 17 g by mouth daily.   predniSONE 20 MG tablet Commonly known as:  DELTASONE Take 2 tablets (40 mg total) by mouth daily with breakfast for 4 days. Start taking on:  04/18/2017, stop taking on 04/22/2017   ranitidine 150 MG tablet Commonly known as:  ZANTAC Take 150 mg by mouth at bedtime.   Spacer/Aero Chamber Mouthpiece Misc 1 Units by Does not apply route every 4 (four) hours as needed (wheezing).     Relevant Imaging Results:  Relevant Lab Results:   Additional Information SS#: [id number]  Geralynn Ochs, LCSW

## 2017-04-16 NOTE — Evaluation (Signed)
Physical Therapy Evaluation Patient Details Name: Dana Bush MRN: [id number] DOB: September 01, 1926 Today's Date: 04/16/2017   History of Present Illness  Dana Bush is a 84 y.o. female with medical history significant of dementia, depression, and IgG monoclonal gammopathy presenting with AMS.    Clinical Impression  Pt admitted with above diagnosis. Pt currently with functional limitations due to the deficits listed below (see PT Problem List). Difficult to determine pt baseline functional status d/t impaired cognition; pt much more steady with RW, hopefully will be able to return to ALF with HHPT; Pt will benefit from skilled PT to increase their independence and safety with mobility to allow discharge to the venue listed below.       Follow Up Recommendations Home health PT;Supervision for mobility/OOB    Equipment Recommendations  None recommended by PT    Recommendations for Other Services       Precautions / Restrictions Precautions Precautions: Fall Restrictions Weight Bearing Restrictions: No      Mobility  Bed Mobility Overal bed mobility: Needs Assistance Bed Mobility: Supine to Sit     Supine to sit: Min assist     General bed mobility comments: incr time, assist with trunk  Transfers Overall transfer level: Needs assistance Equipment used: Rolling walker (2 wheeled) Transfers: Sit to/from [facility] Sit to Stand: Min assist Stand pivot transfers: Min assist;Mod assist       General transfer comment: pt is unsteady with stand pivot requiring min-mod assist for balance and safety, unable to use RW for pivot d/t urinary urgency-pt attempting to get OOB on PT arrival  Ambulation/Gait Ambulation/Gait assistance: Min guard;Min assist Ambulation Distance (Feet): 15 Feet Assistive device: Rolling walker (2 wheeled) Gait Pattern/deviations: Step-through pattern;Decreased stride length;Trunk flexed;Narrow base of support     General  Gait Details: cues for RW posiiton, trunk extension, assist for balance adn to maneuver RW in tight space of room  Stairs            Wheelchair Mobility    Modified Rankin (Stroke Patients Only)       Balance Overall balance assessment: Needs assistance   Sitting balance-Leahy Scale: Fair     Standing balance support: No upper extremity supported;During functional activity Standing balance-Leahy Scale: Poor Standing balance comment: reliant on UEs                             Pertinent Vitals/Pain Pain Assessment: No/denies pain    Home Living Family/patient expects to be discharged to:: [name]-?Memory Care) Living Arrangements: Alone             Home Equipment: [place] - 4 wheels Additional Comments: unsure if pt is reliable historian, answers questions without difficulty although she thinks she is at [employer]    Prior Function Level of Independence: Needs assistance   Gait / Transfers Assistance Needed: amb with rollator            Hand Dominance        Extremity/Trunk Assessment   Upper Extremity Assessment Upper Extremity Assessment: Generalized weakness    Lower Extremity Assessment Lower Extremity Assessment: Generalized weakness       Communication      Cognition Arousal/Alertness: Awake/alert Behavior During Therapy: WFL for tasks assessed/performed Overall Cognitive Status: History of cognitive impairments - at baseline Area of Impairment: Following commands;Orientation;Memory;Problem solving                 Orientation  Level: Disoriented to;Place;Time;Situation   Memory: Decreased short-term memory Following Commands: Follows one step commands consistently;Follows multi-step commands inconsistently     Problem Solving: Difficulty sequencing;Requires verbal cues;Requires tactile cues        General Comments      Exercises     Assessment/Plan    PT Assessment Patient needs continued PT services   PT Problem List Decreased strength;Decreased activity tolerance;Decreased mobility;Decreased cognition;Decreased safety awareness       PT Treatment Interventions DME instruction;Gait training;Functional mobility training;Therapeutic activities;Therapeutic exercise;Patient/family education    PT Goals (Current goals can be found in the Care Plan section)  Acute Rehab PT Goals Patient Stated Goal: pt does not state PT Goal Formulation: With patient Time For Goal Achievement: 05/01/17 Potential to Achieve Goals: Good    Frequency Min 3X/week   Barriers to discharge        Co-evaluation               AM-PAC PT "6 Clicks" Daily Activity  Outcome Measure Difficulty turning over in bed (including adjusting bedclothes, sheets and blankets)?: A Little Difficulty moving from lying on back to sitting on the side of the bed? : Unable Difficulty sitting down on and standing up from a chair with arms (e.g., wheelchair, bedside commode, etc,.)?: Unable Help needed moving to and from a bed to chair (including a wheelchair)?: A Lot Help needed walking in hospital room?: A Little Help needed climbing 3-5 steps with a railing? : A Lot 6 Click Score: 12    End of Session Equipment Utilized During Treatment: Gait belt Activity Tolerance: Patient tolerated treatment well Patient left: in chair;with call bell/phone within reach;with chair alarm set Nurse Communication: Mobility status PT Visit Diagnosis: Muscle weakness (generalized) (M62.81);Difficulty in walking, not elsewhere classified (R26.2)    Time: 3382-5053 PT Time Calculation (min) (ACUTE ONLY): 28 min   Charges:   PT Evaluation $PT Eval Low Complexity: 1 Low PT Treatments $Gait Training: 8-22 mins   PT G CodesKenyon Ana, PT Pager: [phone] 04/16/2017   [facility] 04/16/2017, 12:40 PM

## 2017-04-16 NOTE — Progress Notes (Signed)
PROGRESS NOTE    Dana Bush  ZOX:[id number] DOB: December 21, 1926 DOA: 04/13/2017 PCP: Deland Pretty, MD    Brief Narrative:  84 year old with past medical history relevant for dementia, osteoporosis, IgG monoclonal gammopathy admitted from assisted living with altered mental status, fever and acute hypoxic respiratory failure and found to have influenza A complicated by likely Streptococcus pneumonia.   Assessment & Plan:   Principal Problem:   Sepsis due to pneumonia [facility]) Active Problems:   Influenza A   Dementia   Hyperglycemia   Malnutrition of moderate degree   #) Sepsis due to superimposed Streptococcus pneumonia and influenza A: Improving with diminishing oxygen requirements -Continue ceftriaxone 2 g daily -Continue oseltamivir 75 mg twice daily -Continue IV fluids -Follow-up blood cultures from 04/13/2017 -Continue prednisone 40 mg daily -Continue bronchodilators twice daily  #) Possible UTI: Urine culture growing pansensitive E. coli -Covered by ceftriaxone  #) Dementia: -Continue donepezil 10 mg nightly  #) Osteoporosis: -Hold alendronate 70 mg every Sunday -Hold calcium carbonate/cholecalciferol tablets twice daily  Fluids: IV fluids Elect lites: Monitor and supplement Nutrition: Regular diet  Prophylaxis: enox  Disposition: Pending further improvement in respiratory status and physical therapy evaluation to see if assisted living is appropriate  DO NOT RESUSCITATE   Consultants:   None  Procedures: (Don't include imaging studies which can be auto populated. Include things that cannot be auto populated i.e. Echo, Carotid and venous dopplers, Foley, Bipap, HD, tubes/drains, wound vac, central lines etc)  None  Antimicrobials: (specify start and planned stop date. Auto populated tables are space occupying and do not give end dates)  IV ceftriaxone started 03/18/2017  IV cefepime continued 04/13/2017 to 04/15/2017   Subjective: Patient is  sleeping in bed and does not have any complaints at this time.  Objective: Vitals:   04/16/17 0528 04/16/17 0928 04/16/17 1100 04/16/17 1418  BP: (!) 107/49   133/70  Pulse: (!) 50   72  Resp: 16   18  Temp: 97.6 F (36.4 C)     TempSrc: Axillary     SpO2: 97% 95% 97% 100%  Weight:      Height:        Intake/Output Summary (Last 24 hours) at 04/16/2017 1436 Last data filed at 04/16/2017 0900 Gross per 24 hour  Intake 1910 ml  Output -  Net 1910 ml   Filed Weights   04/13/17 1258 04/13/17 2222 04/15/17 1701  Weight: 54.4 kg (120 lb) 57.4 kg (126 lb 8.7 oz) 59.6 kg (131 lb 6.3 oz)    Examination:  General exam: Appears calm and comfortable  Respiratory system: No increased work of breathing, crackles at bases, no wheezes or rhonchi Cardiovascular system: Distant heart sounds, regular rate and rhythm Gastrointestinal system: Abdomen is nondistended, soft and nontender. No organomegaly or masses felt. Normal bowel sounds heard. Central nervous system: Alert and oriented to person, place, situation but not to time, no focal neurological deficits Extremities: No lower extremity edema Skin: No rashes on visible skin Psychiatry: Judgement and insight appear normal. Mood & affect appropriate.     Data Reviewed: I have personally reviewed following labs and imaging studies  CBC: Recent Labs  Lab 04/13/17 1500 04/14/17 0341 04/15/17 0321 04/16/17 0426  WBC 11.7* 11.5* 13.3* 8.1  NEUTROABS 9.7* 9.9* 11.8*  --   HGB 12.9 12.2 11.4* 11.0*  HCT 39.7 36.7 34.7* 33.1*  MCV 96.6 93.9 95.1 93.2  PLT 192 164 168 QUESTIONABLE RESULTS, RECOMMEND RECOLLECT TO VERIFY   Basic Metabolic  Panel: Recent Labs  Lab 04/13/17 1411 04/14/17 0341 04/15/17 0321 04/16/17 0426  NA 137 140 139 141  K 3.5 2.8* 4.6 3.5  CL 100* 109 114* 112*  CO2 24 20* 19* 21*  GLUCOSE 165* 143* 132* 146*  BUN 16 18 25* 20  CREATININE 0.88 0.69 0.68 0.63  CALCIUM 9.5 8.5* 8.4* 7.8*  MG  --   --  2.1  --    PHOS  --   --  1.1* 2.4*   GFR: Estimated Creatinine Clearance: 38.4 mL/min (by C-G formula based on SCr of 0.63 mg/dL). Liver Function Tests: Recent Labs  Lab 04/13/17 1411 04/15/17 0321 04/16/17 0426  AST 37  --   --   ALT 17  --   --   ALKPHOS 44  --   --   BILITOT 0.5  --   --   PROT 8.6*  --   --   ALBUMIN 3.8 2.6* 2.5*   No results for input(s): LIPASE, AMYLASE in the last 168 hours. No results for input(s): AMMONIA in the last 168 hours. Coagulation Profile: No results for input(s): INR, PROTIME in the last 168 hours. Cardiac Enzymes: No results for input(s): CKTOTAL, CKMB, CKMBINDEX, TROPONINI in the last 168 hours. BNP (last 3 results) No results for input(s): PROBNP in the last 8760 hours. HbA1C: No results for input(s): HGBA1C in the last 72 hours. CBG: No results for input(s): GLUCAP in the last 168 hours. Lipid Profile: No results for input(s): CHOL, HDL, LDLCALC, TRIG, CHOLHDL, LDLDIRECT in the last 72 hours. Thyroid Function Tests: No results for input(s): TSH, T4TOTAL, FREET4, T3FREE, THYROIDAB in the last 72 hours. Anemia Panel: No results for input(s): VITAMINB12, FOLATE, FERRITIN, TIBC, IRON, RETICCTPCT in the last 72 hours. Sepsis Labs: Recent Labs  Lab 04/13/17 1411 04/13/17 1719 04/13/17 2250 04/14/17 0341  PROCALCITON  --   --  3.33  --   LATICACIDVEN 2.73* 2.08* 1.4 1.2    Recent Results (from the past 240 hour(s))  Blood Culture (routine x 2)     Status: None (Preliminary result)   Collection Time: 04/13/17  2:12 PM  Result Value Ref Range Status   Specimen Description   Final    BLOOD LEFT ANTECUBITAL Performed at [facility] [address] [place], [place] [address]    Special Requests   Final    BOTTLES DRAWN AEROBIC AND ANAEROBIC Blood Culture adequate volume Performed at [facility] [address], [place], [place] [address]    Culture   Final    NO GROWTH 3 DAYS Performed at [facility], [place] [address], [place], [place] [address]    Report Status PENDING  Incomplete  Blood Culture (routine x 2)     Status: None (Preliminary result)   Collection Time: 04/13/17  2:13 PM  Result Value Ref Range Status   Specimen Description   Final    BLOOD RIGHT ANTECUBITAL Performed at [facility] [address], [place], [place] [address]    Special Requests   Final    BOTTLES DRAWN AEROBIC ONLY Blood Culture results may not be optimal due to an inadequate volume of blood received in culture bottles Performed at [facility] [address], [place], [place] [address]    Culture   Final    NO GROWTH 3 DAYS Performed at [facility], [place] [address], [place], [place] [address]    Report Status PENDING  Incomplete  Urine culture     Status: Abnormal   Collection Time: 04/13/17  2:45 PM  Result Value Ref Range Status   Specimen Description   Final    URINE, RANDOM Performed at [facility] [address], [place], [place] [address]    Special Requests   Final    NONE Performed at [facility], [place] [address] [place], [place] [address]    Culture >=100,000 COLONIES/mL ESCHERICHIA COLI (A)  Final   Report Status 04/15/2017 FINAL  Final   Organism ID, Bacteria ESCHERICHIA COLI (A)  Final      Susceptibility   Escherichia coli - MIC*    AMPICILLIN 4 SENSITIVE Sensitive     CEFAZOLIN <=4 SENSITIVE Sensitive     CEFTRIAXONE <=1 SENSITIVE Sensitive     CIPROFLOXACIN <=0.25 SENSITIVE Sensitive     GENTAMICIN <=1 SENSITIVE Sensitive     IMIPENEM <=0.25 SENSITIVE Sensitive     NITROFURANTOIN <=16 SENSITIVE Sensitive     TRIMETH/SULFA <=20 SENSITIVE Sensitive     AMPICILLIN/SULBACTAM <=2 SENSITIVE Sensitive     PIP/TAZO <=4 SENSITIVE Sensitive     Extended ESBL NEGATIVE Sensitive     * >=100,000 COLONIES/mL ESCHERICHIA COLI  MRSA PCR Screening     Status: None   Collection Time:  04/13/17 10:30 PM  Result Value Ref Range Status   MRSA by PCR NEGATIVE NEGATIVE Final    Comment:        The GeneXpert MRSA Assay (FDA approved for NASAL specimens only), is one component of a comprehensive MRSA colonization surveillance program. It is not intended to diagnose MRSA infection nor to guide or monitor treatment for MRSA infections. Performed at [facility], [place] [address], [place], [place] [address]          Radiology Studies: No results found.      Scheduled Meds: . donepezil  10 mg Oral QHS  . enoxaparin (LOVENOX) injection  30 mg Subcutaneous QHS  . famotidine  20 mg Oral BID  . ipratropium-albuterol  3 mL Nebulization BID  . multivitamin with minerals  1 tablet Oral Daily  . oseltamivir  30 mg Oral BID  . polyethylene glycol  17 g Oral Daily  . predniSONE  40 mg Oral Q breakfast   Continuous Infusions: . cefTRIAXone (ROCEPHIN)  IV Stopped (04/16/17 1313)     LOS: 3 days    Time spent: [place], MD [facility]  If 7PM-7AM, please contact night-coverage [email or web address] Password [facility] 04/16/2017, 2:36 PM

## 2017-04-17 ENCOUNTER — Encounter (HOSPITAL_COMMUNITY): Payer: Self-pay

## 2017-04-17 MED ORDER — ALBUTEROL SULFATE HFA 108 (90 BASE) MCG/ACT IN AERS: 2.0000 | INHALATION_SPRAY | Freq: Four times a day (QID) | RESPIRATORY_TRACT | 2 refills | Status: AC | PRN

## 2017-04-17 MED ORDER — PREDNISONE 20 MG PO TABS: 40.0000 mg | ORAL_TABLET | Freq: Every day | ORAL | 0 refills | Status: AC

## 2017-04-17 MED ORDER — OSELTAMIVIR PHOSPHATE 30 MG PO CAPS: 30.0000 mg | ORAL_CAPSULE | Freq: Two times a day (BID) | ORAL | 0 refills | Status: AC

## 2017-04-17 MED ORDER — SPACER/AERO CHAMBER MOUTHPIECE MISC: 1.0000 [IU] | 0 refills | Status: AC | PRN

## 2017-04-17 MED ORDER — CEFDINIR 300 MG PO CAPS: 300.0000 mg | ORAL_CAPSULE | Freq: Two times a day (BID) | ORAL | 0 refills | Status: AC

## 2017-04-17 NOTE — Care Management Important Message (Addendum)
Important Message  Patient Details IM Letter given to Cookie/Case Manager to present to the Patient Name: Dana Bush MRN: [id number] Date of Birth: 1926/02/03   Medicare Important Message Given:  Yes    Kerin Salen 04/17/2017, 11:58 AMImportant Message  Patient Details  Name: Dana Bush MRN: [id number] Date of Birth: 09-30-1926   Medicare Important Message Given:  Yes    Kerin Salen 04/17/2017, 11:58 AM

## 2017-04-17 NOTE — Clinical Social Work Note (Signed)
Clinical Social Work Assessment  Patient Details  Name: Dana Bush MRN: [id number] Date of Birth: 07/27/1926  Date of referral:  04/17/17               Reason for consult:  (pt admitted from facility)                Permission sought to share information with:  Family Supports Permission granted to share information::  Yes, Verbal Permission Granted  Name::     son [name]  Agency::  [name]   Relationship::     Contact Information:     Housing/Transportation Living arrangements for the past 2 months:  Assisted Living Facility Source of Information:  Patient, Facility Patient Interpreter Needed:  None Criminal Activity/Legal Involvement Pertinent to Current Situation/Hospitalization:  No - Comment as needed Significant Relationships:  Adult Children, Community Support Lives with:  Facility Resident Do you feel safe going back to the place where you live?  Yes Need for family participation in patient care:  Yes (Comment)(pt with dementia and unable to provide much information. Son [name] is main contact)  Care giving concerns:  Pt lives at [name]. Has dementia and is generally able to recognize family members and caregivers. Needs assistance with ambulating - uses walker at baseline. Pt admitted with flu and pneumonia.   Social Worker assessment / plan:  CSW met with pt to facilitate disposition plan as pt is resident of facility- [name]. Pt was very pleasant but oriented to self only. Eating breakfast independently. States she "can't remember where she lives but talk to my son." CSW left voicemail for pt's son [name]- chart identifies him as POA. Also spoke with Jacie at [name]. She is aware of pt's status in hospital and of recommendation for HHPT/supervision per PT eval. States she is unsure if pt was already receiving HHPT prior to admission but can be arranged at DC. Advised contact Jacie or  Brittany to coordinate DC when pt stable. Chart indicates pt has been resident of [name] since at least 2018- will gather more of social hx/needs when speaking with son. Previous CSW completed FL2- will update if needed at DC.  Plan: pt to return to [name] at DC.   Employment status:  Retired Insurance information:  Managed Medicare([employer] Medicare) PT Recommendations:  Home with Home Health Information / Referral to community resources:     Patient/Family's Response to care:  Pt thanked CSW for visiting but did not remark on her care. Indicated she was not sure where she was or where she lives.  Patient/Family's Understanding of and Emotional Response to Diagnosis, Current Treatment, and Prognosis:  Pt unable to demonstrate understanding- has dementia and oriented to self only. Emotionally was calm and very pleasant, smiling and conversing with CSW but not with meaningful information.   Emotional Assessment Appearance:  Appears stated age Attitude/Demeanor/Rapport:  Engaged, Gracious Affect (typically observed):  Appropriate Orientation:  Oriented to Self Alcohol / Substance use:  Not Applicable Psych involvement (Current and /or in the community):  No (Comment)  Discharge Needs  Concerns to be addressed:  Care Coordination Readmission within the last 30 days:  No Current discharge risk:  None Barriers to Discharge:  Continued Medical Work up    R , LCSW 04/17/2017, 10:30 AM  [phone]  

## 2017-04-17 NOTE — Discharge Summary (Addendum)
Physician Discharge Summary  Dana Bush IPJ:[id number] DOB: 1926/10/03 DOA: 04/13/2017  PCP: Deland Pretty, MD  Admit date: 04/13/2017 Discharge date: 04/17/2017  Admitted From: Skilled nursing facility Disposition: assisted living facility  Recommendations for Outpatient Follow-up:  1. Follow up with PCP in 1-2 weeks 2. Please obtain BMP/CBC in one week   Home Health: Now Equipment/Devices: None  Discharge Condition: Stable CODE STATUS: DO NOT RESUSCITATE Diet recommendation: no salt added diet   Brief/Interim Summary:  #) Sepsis due to influenza A complicated by superimposed Streptococcus pneumoniae pneumonia: Patient was admitted with acute hypoxic respiratory failure and diagnosed with influenza A.  Urine antigen for Streptococcus turn positive.  Patient was maintained on ceftriaxone 2 g daily and discharged home on oral Ceftin ear.  She was also started on oseltamivir and discharged home on oseltamivir to complete total of 5 days.  She was noted to be wheezing on exam and was given IV methylprednisolone and bronchodilators and discharged home on oral prednisone and as needed albuterol.  #) possible UTI: Patient's urine grew out pansensitive E. coli.  Patient was given ceftriaxone and discharged on Ceftin ear.  #) Dementia: Patient was continued on donepezil.  #) Osteoporosis: Patient will restart alendronate weekly and calcium carbonate/cholecalciferol tablets.  Discharge Diagnoses:  Principal Problem:   Sepsis due to pneumonia [facility]) Active Problems:   Influenza A   Dementia   Hyperglycemia   Malnutrition of moderate degree    Discharge Instructions  Discharge Instructions    Call MD for:  difficulty breathing, headache or visual disturbances   Complete by:  As directed    Call MD for:  persistant nausea and vomiting   Complete by:  As directed    Call MD for:  redness, tenderness, or signs of infection (pain, swelling, redness, odor or green/yellow discharge  around incision site)   Complete by:  As directed    Call MD for:  severe uncontrolled pain   Complete by:  As directed    Call MD for:  temperature >100.4   Complete by:  As directed    Diet - low sodium heart healthy   Complete by:  As directed    Discharge instructions   Complete by:  As directed    Please follow-up with your primary care doctor in 1 week.  Please take all of your antibiotics as prescribed.  Please use the albuterol every 4 hours as needed for wheezing.   Increase activity slowly   Complete by:  As directed      Allergies as of 04/17/2017   No Known Allergies     Medication List    TAKE these medications   albuterol 108 (90 Base) MCG/ACT inhaler Commonly known as:  PROVENTIL HFA;VENTOLIN HFA Inhale 2 puffs into the lungs every 6 (six) hours as needed for wheezing or shortness of breath.   alendronate 70 MG tablet Commonly known as:  FOSAMAX Take 70 mg by mouth every Sunday. Take with a full glass of water on an empty stomach.   Calcium Carb-Cholecalciferol 600-400 MG-UNIT Tabs Take 1 tablet by mouth 2 (two) times daily.   cefdinir 300 MG capsule Commonly known as:  OMNICEF Take 1 capsule (300 mg total) by mouth 2 (two) times daily for 4 days. Stop taking on 04/21/2017  donepezil 10 MG tablet Commonly known as:  ARICEPT Take 10 mg by mouth at bedtime.   HYDROcodone-acetaminophen 5-325 MG tablet Commonly known as:  NORCO/VICODIN Take 1 tablet by mouth every 8 (eight)  hours as needed for pain.   oseltamivir 30 MG capsule Commonly known as:  TAMIFLU Take 1 capsule (30 mg total) by mouth 2 (two) times daily for 2 days. What changed:    medication strength  how much to take  additional instructions Stop taking on 04/19/2017   polyethylene glycol packet Commonly known as:  MIRALAX / GLYCOLAX Take 17 g by mouth daily.   predniSONE 20 MG tablet Commonly known as:  DELTASONE Take 2 tablets (40 mg total) by mouth daily with breakfast for 4  days. Start taking on:  04/18/2017, stop taking on 04/22/2017   ranitidine 150 MG tablet Commonly known as:  ZANTAC Take 150 mg by mouth at bedtime.   Spacer/Aero Chamber Mouthpiece Misc 1 Units by Does not apply route every 4 (four) hours as needed (wheezing).      Contact information for after-discharge care    Destination    [name] .   Service:  Assisted Living Contact information: [place] [phone]             No Known Allergies  Consultations:  None   Procedures/Studies: Dg Chest 2 View  Result Date: 04/13/2017 CLINICAL DATA:  Weakness and shortness of breath EXAM: CHEST - 2 VIEW COMPARISON:  Chest radiograph 06/17/2016 FINDINGS: Shallow lung inflation with left basilar consolidation or atelectasis. No pleural effusion or pneumothorax. Multiple chronic thoracic compression fractures. Slight progression of height loss of the most superior fracture. IMPRESSION: 1. Left basilar consolidation and/or atelectasis. 2. Multiple chronic thoracic compression deformities. Slight progression of height loss at the more superior midthoracic compression deformity. Electronically Signed   By: Ulyses Jarred M.D.   On: 04/13/2017 15:47      Subjective:   Discharge Exam: Vitals:   04/17/17 0839 04/17/17 1209  BP:    Pulse:    Resp:    Temp:    SpO2: 94% 95%   Vitals:   04/16/17 2021 04/17/17 0500 04/17/17 0839 04/17/17 1209  BP: 132/61 140/66    Pulse: 66 72    Resp: 18 18    Temp: 98.2 F (36.8 C) 98.5 F (36.9 C)    TempSrc: Oral Oral    SpO2: 98% 99% 94% 95%  Weight:      Height:       General exam: Appears calm and comfortable  Respiratory system: No increased work of breathing, scattered rhonchi throughout anterior lung fields, no wheezes, no crackles Cardiovascular system: Distant heart sounds, regular rate and rhythm Gastrointestinal system: Abdomen is nondistended, soft and  nontender. No organomegaly or masses felt. Normal bowel sounds heard. Central nervous system: Alert and oriented to person, place, situation but not to time, no focal neurological deficits Extremities: No lower extremity edema Skin: No rashes on visible skin Psychiatry: Judgement and insight appear impaired.  Mood & affect appropriate.        The results of significant diagnostics from this hospitalization (including imaging, microbiology, ancillary and laboratory) are listed below for reference.     Microbiology: Recent Results (from the past 240 hour(s))  Blood Culture (routine x 2)     Status: None (Preliminary result)   Collection Time: 04/13/17  2:12 PM  Result Value Ref Range Status   Specimen Description   Final    BLOOD LEFT ANTECUBITAL Performed at [facility] [address] [place], [place] [address]    Special Requests   Final    BOTTLES DRAWN  AEROBIC AND ANAEROBIC Blood Culture adequate volume Performed at [facility] [address], [place], [place] [address]    Culture   Final    NO GROWTH 4 DAYS Performed at [facility], [place] [address], [place], [place] [address]    Report Status PENDING  Incomplete  Blood Culture (routine x 2)     Status: None (Preliminary result)   Collection Time: 04/13/17  2:13 PM  Result Value Ref Range Status   Specimen Description   Final    BLOOD RIGHT ANTECUBITAL Performed at [facility] [address] [place], [place] [address]    Special Requests   Final    BOTTLES DRAWN AEROBIC ONLY Blood Culture results may not be optimal due to an inadequate volume of blood received in culture bottles Performed at [facility] [address] [place], [place] [address]    Culture   Final    NO GROWTH 4 DAYS Performed at [facility], [place] [address] [place], [place] [address]    Report Status PENDING  Incomplete  Urine culture     Status:  Abnormal   Collection Time: 04/13/17  2:45 PM  Result Value Ref Range Status   Specimen Description   Final    URINE, RANDOM Performed at [facility] [address] [place], [place] [address]    Special Requests   Final    NONE Performed at [facility], [place] [address], [place], [place] [address]    Culture >=100,000 COLONIES/mL ESCHERICHIA COLI (A)  Final   Report Status 04/15/2017 FINAL  Final   Organism ID, Bacteria ESCHERICHIA COLI (A)  Final      Susceptibility   Escherichia coli - MIC*    AMPICILLIN 4 SENSITIVE Sensitive     CEFAZOLIN <=4 SENSITIVE Sensitive     CEFTRIAXONE <=1 SENSITIVE Sensitive     CIPROFLOXACIN <=0.25 SENSITIVE Sensitive     GENTAMICIN <=1 SENSITIVE Sensitive     IMIPENEM <=0.25 SENSITIVE Sensitive     NITROFURANTOIN <=16 SENSITIVE Sensitive     TRIMETH/SULFA <=20 SENSITIVE Sensitive     AMPICILLIN/SULBACTAM <=2 SENSITIVE Sensitive     PIP/TAZO <=4 SENSITIVE Sensitive     Extended ESBL NEGATIVE Sensitive     * >=100,000 COLONIES/mL ESCHERICHIA COLI  MRSA PCR Screening     Status: None   Collection Time: 04/13/17 10:30 PM  Result Value Ref Range Status   MRSA by PCR NEGATIVE NEGATIVE Final    Comment:        The GeneXpert MRSA Assay (FDA approved for NASAL specimens only), is one component of a comprehensive MRSA colonization surveillance program. It is not intended to diagnose MRSA infection nor to guide or monitor treatment for MRSA infections. Performed at [facility], [place] [address], [place], [place] [address]      Labs: BNP (last 3 results) Recent Labs    04/13/17 1500  BNP 67.8   Basic Metabolic Panel: Recent Labs  Lab 04/13/17 1411 04/14/17 0341 04/15/17 0321 04/16/17 0426  NA 137 140 139 141  K 3.5 2.8* 4.6 3.5  CL 100* 109 114* 112*  CO2 24 20* 19* 21*  GLUCOSE 165* 143* 132* 146*  BUN 16 18 25* 20  CREATININE 0.88 0.69 0.68 0.63  CALCIUM 9.5 8.5* 8.4*  7.8*  MG  --   --  2.1  --   PHOS  --   --  1.1*  2.4*   Liver Function Tests: Recent Labs  Lab 04/13/17 1411 04/15/17 0321 04/16/17 0426  AST 37  --   --   ALT 17  --   --   ALKPHOS 44  --   --   BILITOT 0.5  --   --   PROT 8.6*  --   --   ALBUMIN 3.8 2.6* 2.5*   No results for input(s): LIPASE, AMYLASE in the last 168 hours. No results for input(s): AMMONIA in the last 168 hours. CBC: Recent Labs  Lab 04/13/17 1500 04/14/17 0341 04/15/17 0321 04/16/17 0426  WBC 11.7* 11.5* 13.3* 8.1  NEUTROABS 9.7* 9.9* 11.8*  --   HGB 12.9 12.2 11.4* 11.0*  HCT 39.7 36.7 34.7* 33.1*  MCV 96.6 93.9 95.1 93.2  PLT 192 164 168 QUESTIONABLE RESULTS, RECOMMEND RECOLLECT TO VERIFY   Cardiac Enzymes: No results for input(s): CKTOTAL, CKMB, CKMBINDEX, TROPONINI in the last 168 hours. BNP: Invalid input(s): POCBNP CBG: No results for input(s): GLUCAP in the last 168 hours. D-Dimer No results for input(s): DDIMER in the last 72 hours. Hgb A1c No results for input(s): HGBA1C in the last 72 hours. Lipid Profile No results for input(s): CHOL, HDL, LDLCALC, TRIG, CHOLHDL, LDLDIRECT in the last 72 hours. Thyroid function studies No results for input(s): TSH, T4TOTAL, T3FREE, THYROIDAB in the last 72 hours.  Invalid input(s): FREET3 Anemia work up No results for input(s): VITAMINB12, FOLATE, FERRITIN, TIBC, IRON, RETICCTPCT in the last 72 hours. Urinalysis    Component Value Date/Time   COLORURINE YELLOW 04/13/2017 1445   APPEARANCEUR HAZY (A) 04/13/2017 1445   LABSPEC 1.013 04/13/2017 1445   PHURINE 6.0 04/13/2017 1445   GLUCOSEU NEGATIVE 04/13/2017 1445   HGBUR LARGE (A) 04/13/2017 1445   BILIRUBINUR NEGATIVE 04/13/2017 1445   KETONESUR 20 (A) 04/13/2017 1445   PROTEINUR 100 (A) 04/13/2017 1445   UROBILINOGEN 0.2 08/10/2011 1650   NITRITE NEGATIVE 04/13/2017 1445   LEUKOCYTESUR SMALL (A) 04/13/2017 1445   Sepsis Labs Invalid input(s): PROCALCITONIN,  WBC,   LACTICIDVEN Microbiology Recent Results (from the past 240 hour(s))  Blood Culture (routine x 2)     Status: None (Preliminary result)   Collection Time: 04/13/17  2:12 PM  Result Value Ref Range Status   Specimen Description   Final    BLOOD LEFT ANTECUBITAL Performed at [facility] [address] [place], [place] [address]    Special Requests   Final    BOTTLES DRAWN AEROBIC AND ANAEROBIC Blood Culture adequate volume Performed at [facility] [address] [place], [place] [address]    Culture   Final    NO GROWTH 4 DAYS Performed at [facility], [place] [address], [place], [place] [address]    Report Status PENDING  Incomplete  Blood Culture (routine x 2)     Status: None (Preliminary result)   Collection Time: 04/13/17  2:13 PM  Result Value Ref Range Status   Specimen Description   Final    BLOOD RIGHT ANTECUBITAL Performed at [facility] [address], [place], [place] [address]    Special Requests   Final    BOTTLES DRAWN AEROBIC ONLY Blood Culture results may not be optimal due to an inadequate volume of blood received in culture bottles Performed at [facility] [address], [place], [place] [address]    Culture   Final    NO GROWTH 4 DAYS Performed at [facility], [address] [place], [place] [address]    Report Status PENDING  Incomplete  Urine culture     Status: Abnormal   Collection Time: 04/13/17  2:45 PM  Result Value Ref Range Status   Specimen Description   Final    URINE, RANDOM Performed at [facility] [address], [place], [place] [address]    Special Requests   Final    NONE Performed at [facility], [place] [address], [place], [place] [address]    Culture >=100,000 COLONIES/mL ESCHERICHIA COLI (A)  Final   Report Status 04/15/2017 FINAL  Final   Organism ID, Bacteria ESCHERICHIA COLI (A)  Final       Susceptibility   Escherichia coli - MIC*    AMPICILLIN 4 SENSITIVE Sensitive     CEFAZOLIN <=4 SENSITIVE Sensitive     CEFTRIAXONE <=1 SENSITIVE Sensitive     CIPROFLOXACIN <=0.25 SENSITIVE Sensitive     GENTAMICIN <=1 SENSITIVE Sensitive     IMIPENEM <=0.25 SENSITIVE Sensitive     NITROFURANTOIN <=16 SENSITIVE Sensitive     TRIMETH/SULFA <=20 SENSITIVE Sensitive     AMPICILLIN/SULBACTAM <=2 SENSITIVE Sensitive     PIP/TAZO <=4 SENSITIVE Sensitive     Extended ESBL NEGATIVE Sensitive     * >=100,000 COLONIES/mL ESCHERICHIA COLI  MRSA PCR Screening     Status: None   Collection Time: 04/13/17 10:30 PM  Result Value Ref Range Status   MRSA by PCR NEGATIVE NEGATIVE Final    Comment:        The GeneXpert MRSA Assay (FDA approved for NASAL specimens only), is one component of a comprehensive MRSA colonization surveillance program. It is not intended to diagnose MRSA infection nor to guide or monitor treatment for MRSA infections. Performed at [facility], [place] [address], [place], [place] [address]      Time coordinating discharge: Over 30 minutes  SIGNED:   Cristy Folks, MD  [facility] 04/17/2017, 12:47 PM  If 7PM-7AM, please contact night-coverage [email or web address] Password [email or web address]

## 2017-04-17 NOTE — Progress Notes (Signed)
Pt returning to [facility]- report [phone]. Spoke with [place] at ALF- provided DC summary and will send signed FL2. [place] reviewed and advised facility prepared for pt's return. Spoke with pt's son [name] and updated him- he is agreeable to plan. Will arrange PTAR transportation.  Sharren Bridge, MSW, LCSW Clinical Social Work 04/17/2017 [phone]

## 2017-04-18 LAB — CULTURE, BLOOD (ROUTINE X 2)
CULTURE: NO GROWTH
Culture: NO GROWTH
Special Requests: ADEQUATE

## 2019-02-28 ENCOUNTER — Encounter (HOSPITAL_COMMUNITY): Payer: Self-pay | Admitting: Emergency Medicine

## 2019-02-28 ENCOUNTER — Inpatient Hospital Stay (HOSPITAL_COMMUNITY)
Admission: EM | Admit: 2019-02-28 | Discharge: 2019-03-04 | DRG: 176 | Disposition: A | Discharge status: Skilled Nursing Facility | Payer: Medicare Other | Source: Skilled Nursing Facility | Attending: Internal Medicine | Admitting: Internal Medicine

## 2019-02-28 ENCOUNTER — Emergency Department (HOSPITAL_COMMUNITY): Payer: Medicare Other

## 2019-02-28 ENCOUNTER — Other Ambulatory Visit: Payer: Self-pay

## 2019-02-28 DIAGNOSIS — R1013 Epigastric pain: Secondary | ICD-10-CM | POA: Diagnosis present

## 2019-02-28 DIAGNOSIS — Z993 Dependence on wheelchair: Secondary | ICD-10-CM | POA: Diagnosis not present

## 2019-02-28 DIAGNOSIS — D649 Anemia, unspecified: Secondary | ICD-10-CM | POA: Diagnosis present

## 2019-02-28 DIAGNOSIS — I2699 Other pulmonary embolism without acute cor pulmonale: Principal | ICD-10-CM | POA: Diagnosis present

## 2019-02-28 DIAGNOSIS — F329 Major depressive disorder, single episode, unspecified: Secondary | ICD-10-CM | POA: Diagnosis present

## 2019-02-28 DIAGNOSIS — Z87891 Personal history of nicotine dependence: Secondary | ICD-10-CM | POA: Diagnosis not present

## 2019-02-28 DIAGNOSIS — I2602 Saddle embolus of pulmonary artery with acute cor pulmonale: Secondary | ICD-10-CM | POA: Diagnosis not present

## 2019-02-28 DIAGNOSIS — M810 Age-related osteoporosis without current pathological fracture: Secondary | ICD-10-CM | POA: Diagnosis present

## 2019-02-28 DIAGNOSIS — Z7401 Bed confinement status: Secondary | ICD-10-CM | POA: Diagnosis not present

## 2019-02-28 DIAGNOSIS — E785 Hyperlipidemia, unspecified: Secondary | ICD-10-CM | POA: Diagnosis present

## 2019-02-28 DIAGNOSIS — I10 Essential (primary) hypertension: Secondary | ICD-10-CM | POA: Diagnosis present

## 2019-02-28 DIAGNOSIS — Z7983 Long term (current) use of bisphosphonates: Secondary | ICD-10-CM

## 2019-02-28 DIAGNOSIS — I82411 Acute embolism and thrombosis of right femoral vein: Secondary | ICD-10-CM | POA: Diagnosis present

## 2019-02-28 DIAGNOSIS — Z20822 Contact with and (suspected) exposure to covid-19: Secondary | ICD-10-CM | POA: Diagnosis present

## 2019-02-28 DIAGNOSIS — Z66 Do not resuscitate: Secondary | ICD-10-CM | POA: Diagnosis present

## 2019-02-28 DIAGNOSIS — Z79899 Other long term (current) drug therapy: Secondary | ICD-10-CM

## 2019-02-28 DIAGNOSIS — N179 Acute kidney failure, unspecified: Secondary | ICD-10-CM

## 2019-02-28 DIAGNOSIS — F0390 Unspecified dementia without behavioral disturbance: Secondary | ICD-10-CM | POA: Diagnosis present

## 2019-02-28 DIAGNOSIS — Z8711 Personal history of peptic ulcer disease: Secondary | ICD-10-CM | POA: Diagnosis not present

## 2019-02-28 LAB — RESPIRATORY PANEL BY RT PCR (FLU A&B, COVID)
Influenza A by PCR: NEGATIVE
Influenza B by PCR: NEGATIVE
SARS Coronavirus 2 by RT PCR: NEGATIVE

## 2019-02-28 LAB — COMPREHENSIVE METABOLIC PANEL
ALT: 23 U/L (ref 0–44)
AST: 26 U/L (ref 15–41)
Albumin: 3.2 g/dL — ABNORMAL LOW (ref 3.5–5.0)
Alkaline Phosphatase: 53 U/L (ref 38–126)
Anion gap: 13 (ref 5–15)
BUN: 16 mg/dL (ref 8–23)
CO2: 18 mmol/L — ABNORMAL LOW (ref 22–32)
Calcium: 9.8 mg/dL (ref 8.9–10.3)
Chloride: 108 mmol/L (ref 98–111)
Creatinine, Ser: 1.15 mg/dL — ABNORMAL HIGH (ref 0.44–1.00)
GFR calc Af Amer: 49 mL/min — ABNORMAL LOW (ref >60)
GFR calc non Af Amer: 42 mL/min — ABNORMAL LOW (ref >60)
Glucose, Bld: 173 mg/dL — ABNORMAL HIGH (ref 70–99)
Potassium: 4.5 mmol/L (ref 3.5–5.1)
Sodium: 139 mmol/L (ref 135–145)
Total Bilirubin: 1 mg/dL (ref 0.3–1.2)
Total Protein: 7.4 g/dL (ref 6.5–8.1)

## 2019-02-28 LAB — CBC
HCT: 37.4 % (ref 36.0–46.0)
Hemoglobin: 11.9 g/dL — ABNORMAL LOW (ref 12.0–15.0)
MCH: 31.1 pg (ref 26.0–34.0)
MCHC: 31.8 g/dL (ref 30.0–36.0)
MCV: 97.7 fL (ref 80.0–100.0)
Platelets: 234 10*3/uL (ref 150–400)
RBC: 3.83 MIL/uL — ABNORMAL LOW (ref 3.87–5.11)
RDW: 13.2 % (ref 11.5–15.5)
WBC: 16.1 10*3/uL — ABNORMAL HIGH (ref 4.0–10.5)
nRBC: 0 % (ref 0.0–0.2)

## 2019-02-28 LAB — URINALYSIS, ROUTINE W REFLEX MICROSCOPIC
Bilirubin Urine: NEGATIVE
Glucose, UA: NEGATIVE mg/dL
Ketones, ur: NEGATIVE mg/dL
Leukocytes,Ua: NEGATIVE
Nitrite: NEGATIVE
Protein, ur: NEGATIVE mg/dL
Specific Gravity, Urine: 1.01 (ref 1.005–1.030)
pH: 6 (ref 5.0–8.0)

## 2019-02-28 LAB — CBG MONITORING, ED: Glucose-Capillary: 127 mg/dL — ABNORMAL HIGH (ref 70–99)

## 2019-02-28 LAB — TROPONIN I (HIGH SENSITIVITY)
Troponin I (High Sensitivity): 8 ng/L (ref <18)
Troponin I (High Sensitivity): 14 ng/L (ref <18)

## 2019-02-28 LAB — LIPASE, BLOOD: Lipase: 34 U/L (ref 11–51)

## 2019-02-28 MED ORDER — ONDANSETRON HCL 4 MG/2ML IJ SOLN: 4.0000 mg | Freq: Four times a day (QID) | INTRAMUSCULAR | Status: DC | PRN

## 2019-02-28 MED ORDER — FERROUS SULFATE 325 (65 FE) MG PO TABS
325.0000 mg | ORAL_TABLET | Freq: Every day | ORAL | Status: DC
  Administered 2019-03-01 – 2019-03-04 (×4): 325 mg via ORAL
  Filled 2019-02-28 (×4): qty 1

## 2019-02-28 MED ORDER — CITALOPRAM HYDROBROMIDE 20 MG PO TABS
40.0000 mg | ORAL_TABLET | Freq: Every day | ORAL | Status: DC
  Administered 2019-03-01 – 2019-03-03 (×3): 40 mg via ORAL
  Filled 2019-02-28 (×3): qty 2

## 2019-02-28 MED ORDER — FENTANYL CITRATE (PF) 100 MCG/2ML IJ SOLN
25.0000 ug | Freq: Once | INTRAMUSCULAR | Status: AC
  Administered 2019-02-28: 14:00:00 25 ug via INTRAVENOUS
  Filled 2019-02-28: qty 2

## 2019-02-28 MED ORDER — ACETAMINOPHEN 325 MG PO TABS
650.0000 mg | ORAL_TABLET | Freq: Four times a day (QID) | ORAL | Status: DC | PRN
  Administered 2019-03-01: 16:00:00 650 mg via ORAL
  Filled 2019-02-28: qty 2

## 2019-02-28 MED ORDER — HEPARIN (PORCINE) 25000 UT/250ML-% IV SOLN
900.0000 [IU]/h | INTRAVENOUS | Status: DC
  Administered 2019-02-28: 19:00:00 900 [IU]/h via INTRAVENOUS
  Filled 2019-02-28: qty 250

## 2019-02-28 MED ORDER — MIRTAZAPINE 15 MG PO TABS
7.5000 mg | ORAL_TABLET | Freq: Every day | ORAL | Status: DC
  Administered 2019-03-01 – 2019-03-03 (×4): 7.5 mg via ORAL
  Filled 2019-02-28 (×4): qty 1

## 2019-02-28 MED ORDER — ONDANSETRON HCL 4 MG PO TABS: 4.0000 mg | ORAL_TABLET | Freq: Four times a day (QID) | ORAL | Status: DC | PRN

## 2019-02-28 MED ORDER — MORPHINE SULFATE (PF) 2 MG/ML IV SOLN
2.0000 mg | INTRAVENOUS | Status: DC | PRN
  Administered 2019-02-28: 2 mg via INTRAVENOUS
  Filled 2019-02-28: qty 1

## 2019-02-28 MED ORDER — SODIUM CHLORIDE 0.9 % IV BOLUS (SEPSIS)
500.0000 mL | Freq: Once | INTRAVENOUS | Status: AC
  Administered 2019-02-28: 19:00:00 500 mL via INTRAVENOUS

## 2019-02-28 MED ORDER — DONEPEZIL HCL 10 MG PO TABS
10.0000 mg | ORAL_TABLET | Freq: Every day | ORAL | Status: DC
  Administered 2019-03-01 – 2019-03-03 (×4): 10 mg via ORAL
  Filled 2019-02-28 (×4): qty 1

## 2019-02-28 MED ORDER — CALCIUM CARBONATE-VITAMIN D 500-200 MG-UNIT PO TABS
1.0000 | ORAL_TABLET | Freq: Two times a day (BID) | ORAL | Status: DC
  Administered 2019-03-01 – 2019-03-04 (×7): 1 via ORAL
  Filled 2019-02-28 (×8): qty 1

## 2019-02-28 MED ORDER — SODIUM CHLORIDE 0.9 % IV SOLN
1000.0000 mL | INTRAVENOUS | Status: DC
  Administered 2019-02-28: 20:00:00 1000 mL via INTRAVENOUS

## 2019-02-28 MED ORDER — MEGESTROL ACETATE 400 MG/10ML PO SUSP
800.0000 mg | Freq: Every day | ORAL | Status: DC
  Administered 2019-03-01: 09:00:00 800 mg via ORAL
  Filled 2019-02-28 (×2): qty 20

## 2019-02-28 MED ORDER — ONDANSETRON HCL 4 MG/2ML IJ SOLN
4.0000 mg | Freq: Once | INTRAMUSCULAR | Status: AC
  Administered 2019-02-28: 14:00:00 4 mg via INTRAVENOUS
  Filled 2019-02-28: qty 2

## 2019-02-28 MED ORDER — IOHEXOL 300 MG/ML  SOLN
80.0000 mL | Freq: Once | INTRAMUSCULAR | Status: AC | PRN
  Administered 2019-02-28: 16:00:00 80 mL via INTRAVENOUS

## 2019-02-28 MED ORDER — LORAZEPAM 2 MG/ML IJ SOLN
0.5000 mg | Freq: Once | INTRAMUSCULAR | Status: AC
  Administered 2019-02-28: 20:00:00 0.5 mg via INTRAVENOUS
  Filled 2019-02-28: qty 1

## 2019-02-28 MED ORDER — FAMOTIDINE 20 MG PO TABS
20.0000 mg | ORAL_TABLET | Freq: Every day | ORAL | Status: DC
  Administered 2019-03-01 – 2019-03-03 (×4): 20 mg via ORAL
  Filled 2019-02-28 (×4): qty 1

## 2019-02-28 MED ORDER — ACETAMINOPHEN 650 MG RE SUPP: 650.0000 mg | Freq: Four times a day (QID) | RECTAL | Status: DC | PRN

## 2019-02-28 MED ORDER — HEPARIN BOLUS VIA INFUSION
3300.0000 [IU] | Freq: Once | INTRAVENOUS | Status: AC
  Administered 2019-02-28: 19:00:00 3300 [IU] via INTRAVENOUS
  Filled 2019-02-28: qty 3300

## 2019-02-28 MED ORDER — LORAZEPAM 0.5 MG PO TABS: 0.5000 mg | ORAL_TABLET | Freq: Every day | ORAL | Status: DC | PRN

## 2019-02-28 MED ORDER — IOHEXOL 350 MG/ML SOLN: 80.0000 mL | Freq: Once | INTRAVENOUS | Status: DC | PRN

## 2019-02-28 MED ORDER — POLYETHYLENE GLYCOL 3350 17 G PO PACK
17.0000 g | PACK | Freq: Every day | ORAL | Status: DC
  Administered 2019-03-01 – 2019-03-04 (×3): 17 g via ORAL
  Filled 2019-02-28 (×4): qty 1

## 2019-02-28 MED ORDER — IOHEXOL 350 MG/ML SOLN
50.0000 mL | Freq: Once | INTRAVENOUS | Status: AC | PRN
  Administered 2019-02-28: 50 mL via INTRAVENOUS

## 2019-02-28 NOTE — ED Provider Notes (Addendum)
CT review, PO challenge, assess for possible return to SNF if all stable. Physical Exam  BP 121/73 (BP Location: Left Arm)   Pulse 89   Temp 98.5 F (36.9 C) (Oral)   Resp 17   SpO2 93%   Physical Exam  ED Course/Procedures     Procedures  MDM  CT has shown findings in the lower lung fields consistent with pulmonary embolus.  Dr. Jeanell Sparrow has ordered a PE study and heparin.  We will follow-up on results of PE study, arrange admission once results return.  Patient's daughter was updated on findings.  Patient upon return from CT expresses pain and seems mildly agitated.  Will administer morphine and 2 mg increments and Ativan 0.5 mg as needed.  Home medication list identifies Ativan as a as needed medication.  Will consult hospitalist service for admission.  Dr. Hal Hope to admit.       Charlesetta Shanks, MD 02/28/19 1620    Charlesetta Shanks, MD 02/28/19 Demetrius Revel    Charlesetta Shanks, MD 02/28/19 [phone]

## 2019-02-28 NOTE — ED Notes (Signed)
Did in and out cath on patient patient did well resting with call bell in reach

## 2019-02-28 NOTE — Progress Notes (Signed)
ANTICOAGULATION CONSULT NOTE - Initial Consult  Pharmacy Consult for heparin IV Indication: pulmonary embolus  No Known Allergies  Patient Measurements:   Heparin Dosing Weight: 55 kg  Vital Signs: Temp: 98.5 F (36.9 C) (02/04 1133) Temp Source: Oral (02/04 1133) BP: 110/56 (02/04 1600) Pulse Rate: 81 (02/04 1600)  Labs: Recent Labs    02/28/19 1148 02/28/19 1349  HGB 11.9*  --   HCT 37.4  --   PLT 234  --   CREATININE 1.15*  --   TROPONINIHS 8 14    CrCl cannot be calculated (Unknown ideal weight.).   Medical History: Past Medical History:  Diagnosis Date  . Anxiety   . Compression fracture of L2 lumbar vertebra (HCC)   . Dementia ([place])   . Depression   . Hyperlipemia   . IgG monoclonal gammopathy   . Osteoporosis   . Peptic ulcer disease     Medications:  Scheduled:  . heparin  3,300 Units Intravenous Once    Assessment: 86 y/o F presenting with chest pain, found to have PE on CT abdomen. PMH includes anxiety, dementia, depression, HLD, osteoporosis, IgG monoclonal gammopathy, and PUD. Patient is not receiving anticoagulation PTA. Pharmacy has been consulted to dose heparin IV infusion.   Hgb 11.9, Plt WNL. Scr up from baseline ~0.6>1.15. No bleeding noted.   Goal of Therapy:  Heparin level 0.3-0.7 units/ml Monitor platelets by anticoagulation protocol: Yes   Plan:  - Heparin 3300 IV bolus - Heparin 900 units/hr IV infusion - Will check heparin level in ~8 hours - Monitor daily heparin level and CBC - Monitor for s/sx of bleeding - F/u CTA  Agnes Lawrence, PharmD PGY1 Pharmacy Resident

## 2019-02-28 NOTE — ED Triage Notes (Signed)
Pt from [facility] in [place] with c/o chest pain. Pt c/o of chest pain daily especially when she is upset however staff reports pt is having a good day and has not been upset. Pt attempted to vomit X1 but nothing came up.   138/40 P:110 RR 22 94-96% RA  EMS gave without relief 324mg  aspirin 1 nitro 4mg  zofran

## 2019-02-28 NOTE — ED Provider Notes (Signed)
[facility] EMERGENCY DEPARTMENT Provider Note   CSN: [id number] Arrival date & time:        History Chief Complaint  Patient presents with  . Chest Pain    Dana Bush is a 86 y.o. female.  HPI     Level 5 caveat secondary to dementia 86 year old female from memory care unit with reports that she is having chest pain nausea and vomited one time.  Per report from nursing home, patient has daily chest pain but usually is when she gets upset.  They state that she was not upset today.  She did become nauseated and had dry heaves but not actually vomited anything. She received aspirin, nitro, and Zofran without any relief. Past Medical History:  Diagnosis Date  . Anxiety   . Compression fracture of L2 lumbar vertebra (HCC)   . Dementia ([place])   . Depression   . Hyperlipemia   . IgG monoclonal gammopathy   . Osteoporosis   . Peptic ulcer disease     Patient Active Problem List   Diagnosis Date Noted  . Influenza A 04/14/2017  . Dementia ([place]) 04/14/2017  . Hyperglycemia 04/14/2017  . Malnutrition of moderate degree 04/14/2017  . Sepsis due to pneumonia ([place]) 04/13/2017  . Chest pain 06/17/2016  . Left leg swelling 06/17/2016  . Drug overdose, accidental or unintentional, initial encounter 06/17/2016  . Malnutrition of mild degree ([place]) 01/09/2011  . Dizziness 01/09/2011  . Compression fracture of L1 lumbar vertebra ([place]) 01/05/2011  . Fall 01/05/2011  . Hyponatremia 01/05/2011  . Osteoporosis 01/05/2011    Past Surgical History:  Procedure Laterality Date  . cervical polyp removal    . DILATION AND CURETTAGE OF UTERUS    . TONSILLECTOMY       OB History   No obstetric history on file.     No family history on file.  Social History   Tobacco Use  . Smoking status: Former Smoker    Packs/day: 1.00  . Smokeless tobacco: Never Used  Substance Use Topics  . Alcohol use: No  . Drug use: No    Home Medications Prior to  Admission medications   Medication Sig Start Date End Date Taking? Authorizing Provider  albuterol (PROVENTIL HFA;VENTOLIN HFA) 108 (90 Base) MCG/ACT inhaler Inhale 2 puffs into the lungs every 6 (six) hours as needed for wheezing or shortness of breath. 04/17/17   Purohit, Konrad Dolores, MD  alendronate (FOSAMAX) 70 MG tablet Take 70 mg by mouth every Sunday. Take with a full glass of water on an empty stomach.     [provider]  Calcium Carb-Cholecalciferol 600-400 MG-UNIT TABS Take 1 tablet by mouth 2 (two) times daily.    [provider]  donepezil (ARICEPT) 10 MG tablet Take 10 mg by mouth at bedtime.    [provider]  HYDROcodone-acetaminophen (NORCO/VICODIN) 5-325 MG per tablet Take 1 tablet by mouth every 8 (eight) hours as needed for pain. 10/15/12   Varney Biles, MD  polyethylene glycol (MIRALAX / GLYCOLAX) packet Take 17 g by mouth daily.    [provider]  ranitidine (ZANTAC) 150 MG tablet Take 150 mg by mouth at bedtime.    [provider]  Spacer/Aero Chamber Mouthpiece MISC 1 Units by Does not apply route every 4 (four) hours as needed (wheezing). 04/17/17   Purohit, Konrad Dolores, MD    Allergies    Patient has no known allergies.  Review of Systems   Review of Systems  All other systems reviewed and are negative.   Physical Exam Updated Vital Signs Pulse 98   Temp 98.5 F (36.9 C) (Oral)   Resp (!) 21   SpO2 94%   Physical Exam Vitals and nursing note reviewed.  Constitutional:      General: She is not in acute distress.    Appearance: She is well-developed. She is not ill-appearing.  HENT:     Head: Normocephalic.  Eyes:     Pupils: Pupils are equal, round, and reactive to light.  Cardiovascular:     Rate and Rhythm: Normal rate and regular rhythm.     Heart sounds: Normal heart sounds.  Pulmonary:     Effort: Pulmonary effort is normal.     Breath sounds: Normal breath sounds.  Abdominal:     Palpations: Abdomen is  soft.     Tenderness: There is abdominal tenderness.     Comments: ttp ruq and epigastrium  Musculoskeletal:        General: Normal range of motion.     Cervical back: Normal range of motion.  Skin:    General: Skin is warm and dry.     Capillary Refill: Capillary refill takes less than 2 seconds.  Neurological:     General: No focal deficit present.     Mental Status: She is alert.     Comments: Oriented to person and place but not date or time  Psychiatric:        Mood and Affect: Mood normal.     ED Results / Procedures / Treatments   Labs (all labs ordered are listed, but only abnormal results are displayed) Labs Reviewed - No data to display  EKG EKG Interpretation  Date/Time:  Thursday February 28 2019 11:29:52 EST Ventricular Rate:  104 PR Interval:    QRS Duration: 75 QT Interval:  320 QTC Calculation: 421 R Axis:   77 Text Interpretation: Sinus tachycardia Atrial premature complex Minimal ST depression, anterolateral leads Confirmed by Pattricia Boss [phone]) on 02/28/2019 11:34:19 AM   Radiology DG Chest Port 1 View  Result Date: 02/28/2019 CLINICAL DATA:  Chest pain today. EXAM: PORTABLE CHEST 1 VIEW COMPARISON:  PA and lateral chest 04/13/2017. FINDINGS: The patient is rotated to the left. There is subsegmental atelectasis in the right mid and lower lung zones. Lungs otherwise clear. Heart size is normal. Atherosclerosis. No pneumothorax or pleural fluid. No acute or focal bony abnormality. IMPRESSION: No acute disease. Electronically Signed   By: Inge Rise M.D.   On: 02/28/2019 12:35    Procedures Procedures (including critical care time)  Medications Ordered in ED Medications - No data to display  ED Course  I have reviewed the triage vital signs and the nursing notes.  Pertinent labs & imaging results that were available during my care of the patient were reviewed by me and considered in my medical decision making (see chart for details).    MDM  Rules/Calculators/A&P                      86 yo female with upper abdominal pain with dry heaves today.  Mild epigastric ttp, labs with leukocytosis, normal lft, lipase and bili. CT abdomen pending.  Discussed with Dr. Johnney Killian and she will follow up ct and dispo Patient improved here with pain meds IN CT now  Discussed with Dr. Carlis Abbott- pe, right sided effuseion Will need cta Dr. Johnney Killian aware Final Clinical Impression(s) / ED Diagnoses Final diagnoses:  None  Rx / DC Orders ED Discharge Orders    None       Pattricia Boss, MD 02/28/19 1600

## 2019-02-28 NOTE — H&P (Addendum)
History and Physical    Dana Bush [id number] DOB: January 11, 1927 DOA: 02/28/2019  PCP: Deland Pretty, MD  Patient coming from: Memory care unit.  Chief Complaint: Chest pain.  History obtained from ER physician since patient has advanced dementia and patient's daughter was not able to be reached.  HPI: Dana Bush is a 86 y.o. female with history of advanced dementia who was complaining of chest pain and also had an episode of nausea vomiting.  Patient was brought to the ER.  Unable to get more history from the patient as patient has severe dementia.  Unable to reach patient's daughter.  ED Course: In the ER patient was hemodynamically stable except blood patient was mildly tachycardic.  CT abdomen pelvis and CT angiogram of the chest was done which shows pulmonary embolism with moderate clot burden.  Patient was started on IV heparin admitted for further observation.  Covid test was negative.  EKG shows sinus tachycardia.  Labs show hemoglobin 11.9 WBC of 16.1 creatinine 1.1 which is increased from 1.62 years ago.  Review of Systems: As per HPI, rest all negative.   Past Medical History:  Diagnosis Date  . Anxiety   . Compression fracture of L2 lumbar vertebra (HCC)   . Dementia ([place])   . Depression   . Hyperlipemia   . IgG monoclonal gammopathy   . Osteoporosis   . Peptic ulcer disease     Past Surgical History:  Procedure Laterality Date  . cervical polyp removal    . DILATION AND CURETTAGE OF UTERUS    . TONSILLECTOMY       reports that she has quit smoking. She smoked 1.00 pack per day. She has never used smokeless tobacco. She reports that she does not drink alcohol or use drugs.  No Known Allergies  Family History  Family history unknown: Yes    Prior to Admission medications   Medication Sig Start Date End Date Taking? Authorizing Provider  alendronate (FOSAMAX) 70 MG tablet Take 70 mg by mouth every Sunday. Take with a full glass of water on an  empty stomach.    Yes [provider]  Calcium Carb-Cholecalciferol 600-400 MG-UNIT TABS Take 1 tablet by mouth 2 (two) times daily.   Yes [provider]  citalopram (CELEXA) 40 MG tablet Take 40 mg by mouth daily. 02/06/19  Yes [provider]  donepezil (ARICEPT) 10 MG tablet Take 10 mg by mouth at bedtime.   Yes [provider]  famotidine (PEPCID) 20 MG tablet Take 20 mg by mouth at bedtime. 02/01/19  Yes [provider]  ferrous sulfate 325 (65 FE) MG tablet Take 325 mg by mouth daily with breakfast.   Yes [provider]  furosemide (LASIX) 20 MG tablet Take 20 mg by mouth daily. 01/29/19  Yes [provider]  LORazepam (ATIVAN) 0.5 MG tablet Take 0.5 mg by mouth daily as needed. 02/04/19  Yes [provider]  megestrol (MEGACE) 400 MG/10ML suspension Take 800 mg by mouth daily.   Yes [provider]  mirtazapine (REMERON) 7.5 MG tablet Take 7.5 mg by mouth at bedtime. 01/28/19  Yes [provider]  polyethylene glycol (MIRALAX / GLYCOLAX) packet Take 17 g by mouth daily.   Yes [provider]  potassium chloride (KLOR-CON) 10 MEQ tablet Take 30 mEq by mouth 2 (two) times daily. 01/26/19  Yes [provider]  POTASSIUM PHOSPHATE MONOBASIC PO Take 1 tablet by mouth 2 (two) times daily.  Yes [provider]  albuterol (PROVENTIL HFA;VENTOLIN HFA) 108 (90 Base) MCG/ACT inhaler Inhale 2 puffs into the lungs every 6 (six) hours as needed for wheezing or shortness of breath. Patient not taking: Reported on 02/28/2019 04/17/17   Purohit, Konrad Dolores, MD  doxycycline (VIBRA-TABS) 100 MG tablet Take 100 mg by mouth 2 (two) times daily. 02/16/19   [provider]  HYDROcodone-acetaminophen (NORCO/VICODIN) 5-325 MG per tablet Take 1 tablet by mouth every 8 (eight) hours as needed for pain. Patient not taking: Reported on 02/28/2019 10/15/12   Varney Biles, MD  Spacer/Aero Chamber Mouthpiece MISC  1 Units by Does not apply route every 4 (four) hours as needed (wheezing). Patient not taking: Reported on 02/28/2019 04/17/17   Cristy Folks, MD    Physical Exam: Constitutional: Moderately built and nourished. Vitals:   02/28/19 1415 02/28/19 1430 02/28/19 1600 02/28/19 1850  BP: 99/62 (!) 141/119 (!) 110/56 (!) 106/55  Pulse: 87 97 81 81  Resp: (!) 24 (!) 24 (!) 25 12  Temp:      TempSrc:      SpO2: 95% 92% 97% 98%   Eyes: Anicteric no pallor. ENMT: No discharge from the ears eyes nose or mouth. Neck: No mass felt.  No neck rigidity. Respiratory: No rhonchi or crepitations. Cardiovascular: S1-S2 heard. Abdomen: Soft nontender bowel sounds present. Musculoskeletal: No edema.  Joint effusion. Skin: No rash. Neurologic: Alert awake but confused and does not follow commands. Psychiatric: Patient has severe dementia.   Labs on Admission: I have personally reviewed following labs and imaging studies  CBC: Recent Labs  Lab 02/28/19 1148  WBC 16.1*  HGB 11.9*  HCT 37.4  MCV 97.7  PLT [id number]   Basic Metabolic Panel: Recent Labs  Lab 02/28/19 1148  NA 139  K 4.5  CL 108  CO2 18*  GLUCOSE 173*  BUN 16  CREATININE 1.15*  CALCIUM 9.8   GFR: CrCl cannot be calculated (Unknown ideal weight.). Liver Function Tests: Recent Labs  Lab 02/28/19 1148  AST 26  ALT 23  ALKPHOS 53  BILITOT 1.0  PROT 7.4  ALBUMIN 3.2*   Recent Labs  Lab 02/28/19 1148  LIPASE 34   No results for input(s): AMMONIA in the last 168 hours. Coagulation Profile: No results for input(s): INR, PROTIME in the last 168 hours. Cardiac Enzymes: No results for input(s): CKTOTAL, CKMB, CKMBINDEX, TROPONINI in the last 168 hours. BNP (last 3 results) No results for input(s): PROBNP in the last 8760 hours. HbA1C: No results for input(s): HGBA1C in the last 72 hours. CBG: Recent Labs  Lab 02/28/19 1249  GLUCAP 127*   Lipid Profile: No results for input(s): CHOL, HDL, LDLCALC, TRIG,  CHOLHDL, LDLDIRECT in the last 72 hours. Thyroid Function Tests: No results for input(s): TSH, T4TOTAL, FREET4, T3FREE, THYROIDAB in the last 72 hours. Anemia Panel: No results for input(s): VITAMINB12, FOLATE, FERRITIN, TIBC, IRON, RETICCTPCT in the last 72 hours. Urine analysis:    Component Value Date/Time   COLORURINE YELLOW 02/28/2019 1150   APPEARANCEUR CLEAR 02/28/2019 1150   LABSPEC 1.010 02/28/2019 1150   PHURINE 6.0 02/28/2019 1150   GLUCOSEU NEGATIVE 02/28/2019 1150   HGBUR MODERATE (A) 02/28/2019 1150   BILIRUBINUR NEGATIVE 02/28/2019 1150   KETONESUR NEGATIVE 02/28/2019 1150   PROTEINUR NEGATIVE 02/28/2019 1150   UROBILINOGEN 0.2 08/10/2011 1650   NITRITE NEGATIVE 02/28/2019 1150   LEUKOCYTESUR NEGATIVE 02/28/2019 1150   Sepsis Labs: @LABRCNTIP (procalcitonin:4,lacticidven:4) ) Recent Results (from the past 240 hour(s))  Respiratory Panel by RT PCR (Flu A&B, Covid) - Nasopharyngeal Swab     Status: None   Collection Time: 02/28/19  8:01 PM   Specimen: Nasopharyngeal Swab  Result Value Ref Range Status   SARS Coronavirus 2 by RT PCR NEGATIVE NEGATIVE Final    Comment: (NOTE) SARS-CoV-2 target nucleic acids are NOT DETECTED. The SARS-CoV-2 RNA is generally detectable in upper respiratoy specimens during the acute phase of infection. The lowest concentration of SARS-CoV-2 viral copies this assay can detect is 131 copies/mL. A negative result does not preclude SARS-Cov-2 infection and should not be used as the sole basis for treatment or other patient management decisions. A negative result may occur with  improper specimen collection/handling, submission of specimen other than nasopharyngeal swab, presence of viral mutation(s) within the areas targeted by this assay, and inadequate number of viral copies (<131 copies/mL). A negative result must be combined with clinical observations, patient history, and epidemiological information. The expected result is  Negative. Fact Sheet for Patients:  PinkCheek.be Fact Sheet for Healthcare Providers:  GravelBags.it This test is not yet ap proved or cleared by the [place] FDA and  has been authorized for detection and/or diagnosis of SARS-CoV-2 by FDA under an Emergency Use Authorization (EUA). This EUA will remain  in effect (meaning this test can be used) for the duration of the COVID-19 declaration under Section 564(b)(1) of the Act, 21 U.S.C. section 360bbb-3(b)(1), unless the authorization is terminated or revoked sooner.    Influenza A by PCR NEGATIVE NEGATIVE Final   Influenza B by PCR NEGATIVE NEGATIVE Final    Comment: (NOTE) The Xpert Xpress SARS-CoV-2/FLU/RSV assay is intended as an aid in  the diagnosis of influenza from Nasopharyngeal swab specimens and  should not be used as a sole basis for treatment. Nasal washings and  aspirates are unacceptable for Xpert Xpress SARS-CoV-2/FLU/RSV  testing. Fact Sheet for Patients: PinkCheek.be Fact Sheet for Healthcare Providers: GravelBags.it This test is not yet approved or cleared by the [place] FDA and  has been authorized for detection and/or diagnosis of SARS-CoV-2 by  FDA under an Emergency Use Authorization (EUA). This EUA will remain  in effect (meaning this test can be used) for the duration of the  Covid-19 declaration under Section 564(b)(1) of the Act, 21  U.S.C. section 360bbb-3(b)(1), unless the authorization is  terminated or revoked. Performed at [facility], [place] [address], [place], [place] [address]      Radiological Exams on Admission: CT Angio Chest PE W and/or Wo Contrast  Result Date: 02/28/2019 CLINICAL DATA:  Pulmonary embolism on CT abdomen today EXAM: CT ANGIOGRAPHY CHEST WITH CONTRAST TECHNIQUE: Multidetector CT imaging of the chest was performed using the standard protocol during  bolus administration of intravenous contrast. Multiplanar CT image reconstructions and MIPs were obtained to evaluate the vascular anatomy. CONTRAST:  50 mL Isovue 370 IV COMPARISON:  CT abdomen pelvis today. FINDINGS: Cardiovascular: Acute pulmonary embolism in the right main pulmonary artery extending into the right lower lobe pulmonary arteries. Relatively extensive thrombus is noted. Small amount of thrombus extending into the right middle lobe pulmonary artery. Right upper lobe clear for emboli. No emboli on the left. Heart size normal. RV LV ratio 0.96, upper normal. No pericardial effusion. Atherosclerotic aortic arch. No aortic aneurysm or dissection. Mediastinum/Nodes: Moderately large hiatal hernia. No mediastinal mass or adenopathy. Lungs/Pleura: Small right effusion. Right lower lobe airspace disease with elevated right hemidiaphragm. Left lung clear. Cystic change in the right upper  lobe with surrounding soft tissue thickening, measuring approximately 3.5 cm in diameter. This is not present on the prior chest CT of 07/28/2011. Associated fissural thickening. Upper Abdomen: No acute abnormality. Musculoskeletal: Multiple thoracic fractures including T7, T8, T9, T10, T11, T12 of indeterminate age. Review of the MIP images confirms the above findings. IMPRESSION: Acute pulmonary embolism primarily in the right lower lobe but also in the right middle lobe. No embolism on the left. Moderate clot volume. RV LV ratio upper normal 0.96 Patchy right lower lobe airspace disease possibly atelectasis, pneumonia, or pulmonary infarct. Small right effusion Cystic change right upper lobe with surrounding soft tissue thickening. This could be due to acute or chronic infection. Follow-up recommended. Aortic Atherosclerosis (ICD10-I70.0). Electronically Signed   By: Franchot Gallo M.D.   On: 02/28/2019 18:26   CT ABDOMEN PELVIS W CONTRAST  Result Date: 02/28/2019 CLINICAL DATA:  Abdominal pain and fever. EXAM: CT  ABDOMEN AND PELVIS WITH CONTRAST TECHNIQUE: Multidetector CT imaging of the abdomen and pelvis was performed using the standard protocol following bolus administration of intravenous contrast. CONTRAST:  9mL OMNIPAQUE IOHEXOL 300 MG/ML  SOLN COMPARISON:  MRI lumbar spine 01/06/2011 FINDINGS: Lower chest: Small right pleural effusion with mild right lower lobe atelectasis. Right middle lobe atelectasis. Acute pulmonary emboli in the right lower lobe pulmonary artery. Incomplete evaluation for pulmonary embolism. Hepatobiliary: No focal liver abnormality is seen. No gallstones, gallbladder wall thickening, or biliary dilatation. Pancreas: Negative Spleen: Negative Adrenals/Urinary Tract: Adrenal glands are unremarkable. Kidneys are normal, without renal calculi, focal lesion, or hydronephrosis. Bladder is unremarkable. Stomach/Bowel: Moderately large hiatal hernia. Negative for bowel obstruction. Negative for bowel mass or edema. Incomplete bowel rotation with the cecum in the left upper quadrant. Prominent ileocecal valve. Appendix nonvisualized. Sigmoid diverticulosis without diverticulitis. Vascular/Lymphatic: Atherosclerotic disease with diffuse aortic and iliac calcifications. No aneurysm. No enlarged lymph nodes. Reproductive: Uterus and bilateral adnexa are unremarkable. Other: Small umbilical hernia.  No free fluid. Musculoskeletal: Multiple compression fractures including T7, T8, T9, T10, T12, L1, L3, L4. Previously noted MRI 2012 demonstrated fractures at T12 and L1. IMPRESSION: 1. Pulmonary embolism right lower lobe pulmonary artery. Incomplete evaluation of the pulmonary arteries. Small right pleural effusion and right lower lobe airspace disease. Possible pulmonary infarct. 2. No acute abnormality in the abdomen 3. Numerous compression fractures in the thoracic and lumbar spine of indeterminate age. 4. These results were called by telephone at the time of interpretation on 02/28/2019 at 4:01 pm to provider  [facility] RAY , who verbally acknowledged these results. Electronically Signed   By: Franchot Gallo M.D.   On: 02/28/2019 16:01   DG Chest Port 1 View  Result Date: 02/28/2019 CLINICAL DATA:  Chest pain today. EXAM: PORTABLE CHEST 1 VIEW COMPARISON:  PA and lateral chest 04/13/2017. FINDINGS: The patient is rotated to the left. There is subsegmental atelectasis in the right mid and lower lung zones. Lungs otherwise clear. Heart size is normal. Atherosclerosis. No pneumothorax or pleural fluid. No acute or focal bony abnormality. IMPRESSION: No acute disease. Electronically Signed   By: Inge Rise M.D.   On: 02/28/2019 12:35    EKG: Independently reviewed.  Sinus tachycardia.  Assessment/Plan Principal Problem:   Acute pulmonary embolism (HCC) Active Problems:   Dementia ([place])   ARF (acute renal failure) ([place])    1. Acute pulmonary embolism with moderate clot burden likely unprovoked will check 2D echo Dopplers of the lower extremity and continue heparin for now. 2. Chronic anemia on iron supplements. 3.  Dementia on Aricept. 4. Leukocytosis could be from stress of but recently patient on antibiotics.  Check procalcitonin and discontinue antibiotics if procalcitonin is negative. 5. Depression on citalopram.  Given that patient has acute pulmonary embolism with moderate clot burden will need to closely monitor for any further deterioration.  Will need inpatient status.   DVT prophylaxis: Heparin. Code Status: DNR.  This was confirmed by patient's daughter to the ER physician. Family Communication: Unable to reach patient's daughter. Disposition Plan: Back to facility. Consults called: None. Admission status: Inpatient.   Rise Patience MD [facility] Pager [phone].  If 7PM-7AM, please contact night-coverage [email or web address] Password [facility]  02/28/2019, 9:36 PM

## 2019-02-28 NOTE — ED Notes (Signed)
Spoke with pt daughter, Kingsley Callander and gave update. She would like to be continuous updates as needed.

## 2019-03-01 ENCOUNTER — Inpatient Hospital Stay (HOSPITAL_COMMUNITY): Payer: Medicare Other

## 2019-03-01 DIAGNOSIS — I2699 Other pulmonary embolism without acute cor pulmonale: Secondary | ICD-10-CM

## 2019-03-01 DIAGNOSIS — I2602 Saddle embolus of pulmonary artery with acute cor pulmonale: Secondary | ICD-10-CM

## 2019-03-01 LAB — CBC
HCT: 31.7 % — ABNORMAL LOW (ref 36.0–46.0)
Hemoglobin: 10.3 g/dL — ABNORMAL LOW (ref 12.0–15.0)
MCH: 30.8 pg (ref 26.0–34.0)
MCHC: 32.5 g/dL (ref 30.0–36.0)
MCV: 94.9 fL (ref 80.0–100.0)
Platelets: 208 10*3/uL (ref 150–400)
RBC: 3.34 MIL/uL — ABNORMAL LOW (ref 3.87–5.11)
RDW: 13.3 % (ref 11.5–15.5)
WBC: 15.1 10*3/uL — ABNORMAL HIGH (ref 4.0–10.5)
nRBC: 0 % (ref 0.0–0.2)

## 2019-03-01 LAB — BASIC METABOLIC PANEL
Anion gap: 10 (ref 5–15)
BUN: 15 mg/dL (ref 8–23)
CO2: 19 mmol/L — ABNORMAL LOW (ref 22–32)
Calcium: 9.2 mg/dL (ref 8.9–10.3)
Chloride: 111 mmol/L (ref 98–111)
Creatinine, Ser: 1.1 mg/dL — ABNORMAL HIGH (ref 0.44–1.00)
GFR calc Af Amer: 51 mL/min — ABNORMAL LOW (ref >60)
GFR calc non Af Amer: 44 mL/min — ABNORMAL LOW (ref >60)
Glucose, Bld: 116 mg/dL — ABNORMAL HIGH (ref 70–99)
Potassium: 3.9 mmol/L (ref 3.5–5.1)
Sodium: 140 mmol/L (ref 135–145)

## 2019-03-01 LAB — HEPARIN LEVEL (UNFRACTIONATED)
Heparin Unfractionated: 0.55 IU/mL (ref 0.30–0.70)
Heparin Unfractionated: 0.62 IU/mL (ref 0.30–0.70)

## 2019-03-01 LAB — TROPONIN I (HIGH SENSITIVITY): Troponin I (High Sensitivity): 15 ng/L (ref <18)

## 2019-03-01 LAB — MRSA PCR SCREENING: MRSA by PCR: NEGATIVE

## 2019-03-01 LAB — ECHOCARDIOGRAM COMPLETE: Weight: 1968.27 oz

## 2019-03-01 LAB — PROCALCITONIN: Procalcitonin: 0.29 ng/mL

## 2019-03-01 LAB — GLUCOSE, CAPILLARY: Glucose-Capillary: 114 mg/dL — ABNORMAL HIGH (ref 70–99)

## 2019-03-01 MED ORDER — SODIUM CHLORIDE 0.9 % IV SOLN
100.0000 mg | Freq: Two times a day (BID) | INTRAVENOUS | Status: DC
  Administered 2019-03-01: 10:00:00 100 mg via INTRAVENOUS
  Filled 2019-03-01 (×3): qty 100

## 2019-03-01 MED ORDER — APIXABAN 5 MG PO TABS: 5.0000 mg | ORAL_TABLET | Freq: Two times a day (BID) | ORAL | Status: DC

## 2019-03-01 MED ORDER — APIXABAN 5 MG PO TABS
10.0000 mg | ORAL_TABLET | Freq: Two times a day (BID) | ORAL | Status: DC
  Administered 2019-03-01 – 2019-03-04 (×7): 10 mg via ORAL
  Filled 2019-03-01 (×6): qty 2

## 2019-03-01 NOTE — Progress Notes (Signed)
Lower extremity venous has been completed.   Preliminary results in CV Proc.   Abram Sander 03/01/2019 2:24 PM

## 2019-03-01 NOTE — Progress Notes (Signed)
Echocardiogram 2D Echocardiogram has been performed.  Oneal Deputy Sudeep Scheibel 03/01/2019, 9:04 AM

## 2019-03-01 NOTE — Progress Notes (Addendum)
ANTICOAGULATION CONSULT NOTE  Pharmacy Consult for heparin IV Indication: pulmonary embolus  No Known Allergies  Patient Measurements: Weight: 123 lb 0.3 oz (55.8 kg) Heparin Dosing Weight: 55 kg  Vital Signs: Temp: 98.7 F (37.1 C) (02/05 0903) Temp Source: Oral (02/05 0903) BP: 103/43 (02/05 0903) Pulse Rate: 74 (02/05 0903)  Labs: Recent Labs    02/28/19 1148 02/28/19 1349 02/28/19 2317 03/01/19 0007 03/01/19 0503  HGB 11.9*  --   --   --  10.3*  HCT 37.4  --   --   --  31.7*  PLT 234  --   --   --  208  HEPARINUNFRC  --   --   --  0.62 0.55  CREATININE 1.15*  --   --   --  1.10*  TROPONINIHS 8 14 15   --   --     CrCl cannot be calculated (Unknown ideal weight.).   Medical History: Past Medical History:  Diagnosis Date  . Anxiety   . Compression fracture of L2 lumbar vertebra (HCC)   . Dementia ([place])   . Depression   . Hyperlipemia   . IgG monoclonal gammopathy   . Osteoporosis   . Peptic ulcer disease     Medications:  Scheduled:  . calcium-vitamin D  1 tablet Oral BID  . citalopram  40 mg Oral Daily  . donepezil  10 mg Oral QHS  . famotidine  20 mg Oral QHS  . ferrous sulfate  325 mg Oral Q breakfast  . megestrol  800 mg Oral Daily  . mirtazapine  7.5 mg Oral QHS  . polyethylene glycol  17 g Oral Daily    Assessment: 86 y/o F presenting with chest pain, found to have PE on CT. PMH includes anxiety, dementia, depression, HLD, osteoporosis, IgG monoclonal gammopathy, and PUD. Patient is not receiving anticoagulation PTA. Pharmacy has been consulted to dose heparin IV infusion.   Heparin remains therapeutic on confirmatory check, CBC stable this am, ECHO pending.  Goal of Therapy:  Heparin level 0.3-0.7 units/ml Monitor platelets by anticoagulation protocol: Yes   Plan:  -Continue heparin 900 units/h -Daily heparin level and CBC -F/U oral anticoagulation plans  ADDENDUM: Pt to transition to apixaban. Dopplers notable for R  DVT.  Plan: -Stop heparin -Apixaban 10mg  BID x7 days the 5mg  BID   Arrie Senate, PharmD, BCPS Clinical Pharmacist [phone] Please check AMION for all [place] numbers 03/01/2019

## 2019-03-01 NOTE — Progress Notes (Signed)
PT Cancellation Note  Patient Details Name: Dana Bush MRN: [id number] DOB: 30-Jun-1926   Cancelled Treatment:    Reason Eval/Treat Not Completed: Medical issues which prohibited therapy Pt with new PE and was started on heparin. Per protocol, pt must be on heparin for 24 hours prior to initiation of therapy. Will follow up as pt medically appropriate and as schedule allows.   Lou Miner, DPT  Acute Rehabilitation Services  Pager: [phone] Office: [phone]    Rudean Hitt 03/01/2019, 3:41 PM

## 2019-03-01 NOTE — Progress Notes (Addendum)
PROGRESS NOTE  VELENA MCVEIGH [id number] DOB: 02/11/1926 DOA: 02/28/2019 PCP: Deland Pretty, MD   LOS: 1 day   Brief narrative: As per HPI, BIATRIZ HUSIC is a 86 y.o. female with history of advanced dementia, hypertension, hyperlipidemia, osteoporosis presented to the hospital with chest pain nausea and vomiting.  History was limited due to advanced dementia.  ED Course: In the ER patient was hemodynamically stable except blood patient was mildly tachycardic.  CT abdomen pelvis and CT angiogram of the chest was done which shows pulmonary embolism with moderate clot burden.  Patient was started on IV heparin admitted for further observation.  Covid test was negative.  EKG shows sinus tachycardia.  Labs show hemoglobin 11.9 WBC of 16.1 creatinine 1.1 which is increased from 1.62 years ago.  Assessment/Plan:  Principal Problem:   Acute pulmonary embolism (HCC) Active Problems:   Dementia ([place])   ARF (acute renal failure) ([place])   Acute pulmonary embolism with moderate clot burden.  Spoke with the family.  Patient is mostly bedbound and is on a wheelchair.  Likely secondary to sedentary condition. 2D echo was performed without RV strain.  Venous Dopplers of the lower extremity showed the left lower extremity DVT.  Preliminary finding.  Was continued on heparin drip overnight.  Will put the patient on Eliquis tonight. Will closely monitor.  Patient stated that she does not have any history of bleeding in the past.  Patient is on Megace at home.  Will likely need to discontinue on discharge.  Covid 19 and influenza was negative.  UA negative for infection.  Right extremity DVT.  Will be started on Eliquis tonight.  Chronic anemia on iron supplements.  Will continue on discharge.  Hemoglobin of 10.3  Dementia on Aricept.  We will continue with that.  Supportive care.  Leukocytosis could be reactive from pulmonary embolism.  No fever..  Still mild leukocytosis.  Procalcitonin is 0.29.   Discontinue antibiotic.  Depression on citalopram.  VTE Prophylaxis: Heparin drip.  Changed to Eliquis.   Code Status: DNR  Family Communication: Spoke with the patient's son [name] on the phone and updated him about the clinical condition of the patient and the plan for anticoagulation.  Disposition Plan:   Patient is from assisted living facility.  Likely disposition to assisted living facility likely by tomorrow.  Will get physical therapy evaluation  Barriers to discharge: Physical therapy evaluation pending, patient is from assisted living facility-transition of care notified.  Consultants:  None  Procedures:  None  Antibiotics:   None  Anti-infectives (From admission, onward)   Start     Dose/Rate Route Frequency Ordered Stop   03/01/19 0800  doxycycline (VIBRAMYCIN) 100 mg in sodium chloride 0.9 % 250 mL IVPB     100 mg 125 mL/hr over 120 Minutes Intravenous Every 12 hours 03/01/19 0724       Subjective:  Today,patient was seen and examined at bedside.  Seen during duplex ultrasound.  Complained of leg pain.  Denied shortness of breath.  Poor historian.  Mild chest discomfort.  Objective: Vitals:   03/01/19 0903 03/01/19 1104  BP: (!) 103/43 (!) 113/45  Pulse: 74 74  Resp: 17 20  Temp: 98.7 F (37.1 C) 98.3 F (36.8 C)  SpO2: 95% 95%    Intake/Output Summary (Last 24 hours) at 03/01/2019 1530 Last data filed at 03/01/2019 1244 Gross per 24 hour  Intake 555.85 ml  Output 200 ml  Net 355.85 ml   [employer]  03/01/19 0059 03/01/19 1015  Weight: 55.8 kg 56.7 kg   Body mass index is 22.86 kg/m.   Physical Exam: GENERAL: Patient is alert awake but poor historian.  Not in obvious distress.  Elderly female. HENT: No scleral pallor or icterus. Pupils equally reactive to light. Oral mucosa is moist NECK: is supple, no gross neck swelling noted. CHEST: Clear to auscultation. No crackles or wheezes.  Diminished breath sounds bilaterally. CVS: S1 and  S2 heard, no murmur. Regular rate and rhythm.  ABDOMEN: Soft, non-tender, bowel sounds are present. EXTREMITIES: No edema. CNS: Cranial nerves are intact. No focal motor deficits.  Disoriented. SKIN: warm and dry without rashes.  Data Review: I have personally reviewed the following laboratory data and studies,  CBC: Recent Labs  Lab 02/28/19 1148 03/01/19 0503  WBC 16.1* 15.1*  HGB 11.9* 10.3*  HCT 37.4 31.7*  MCV 97.7 94.9  PLT 234 [id number]   Basic Metabolic Panel: Recent Labs  Lab 02/28/19 1148 03/01/19 0503  NA 139 140  K 4.5 3.9  CL 108 111  CO2 18* 19*  GLUCOSE 173* 116*  BUN 16 15  CREATININE 1.15* 1.10*  CALCIUM 9.8 9.2   Liver Function Tests: Recent Labs  Lab 02/28/19 1148  AST 26  ALT 23  ALKPHOS 53  BILITOT 1.0  PROT 7.4  ALBUMIN 3.2*   Recent Labs  Lab 02/28/19 1148  LIPASE 34   No results for input(s): AMMONIA in the last 168 hours. Cardiac Enzymes: No results for input(s): CKTOTAL, CKMB, CKMBINDEX, TROPONINI in the last 168 hours. BNP (last 3 results) No results for input(s): BNP in the last 8760 hours.  ProBNP (last 3 results) No results for input(s): PROBNP in the last 8760 hours.  CBG: Recent Labs  Lab 02/28/19 1249 03/01/19 1106  GLUCAP 127* 114*   Recent Results (from the past 240 hour(s))  Respiratory Panel by RT PCR (Flu A&B, Covid) - Nasopharyngeal Swab     Status: None   Collection Time: 02/28/19  8:01 PM   Specimen: Nasopharyngeal Swab  Result Value Ref Range Status   SARS Coronavirus 2 by RT PCR NEGATIVE NEGATIVE Final    Comment: (NOTE) SARS-CoV-2 target nucleic acids are NOT DETECTED. The SARS-CoV-2 RNA is generally detectable in upper respiratoy specimens during the acute phase of infection. The lowest concentration of SARS-CoV-2 viral copies this assay can detect is 131 copies/mL. A negative result does not preclude SARS-Cov-2 infection and should not be used as the sole basis for treatment or other patient  management decisions. A negative result may occur with  improper specimen collection/handling, submission of specimen other than nasopharyngeal swab, presence of viral mutation(s) within the areas targeted by this assay, and inadequate number of viral copies (<131 copies/mL). A negative result must be combined with clinical observations, patient history, and epidemiological information. The expected result is Negative. Fact Sheet for Patients:  PinkCheek.be Fact Sheet for Healthcare Providers:  GravelBags.it This test is not yet ap proved or cleared by the [place] FDA and  has been authorized for detection and/or diagnosis of SARS-CoV-2 by FDA under an Emergency Use Authorization (EUA). This EUA will remain  in effect (meaning this test can be used) for the duration of the COVID-19 declaration under Section 564(b)(1) of the Act, 21 U.S.C. section 360bbb-3(b)(1), unless the authorization is terminated or revoked sooner.    Influenza A by PCR NEGATIVE NEGATIVE Final   Influenza B by PCR NEGATIVE NEGATIVE Final    Comment: (NOTE) The  Xpert Xpress SARS-CoV-2/FLU/RSV assay is intended as an aid in  the diagnosis of influenza from Nasopharyngeal swab specimens and  should not be used as a sole basis for treatment. Nasal washings and  aspirates are unacceptable for Xpert Xpress SARS-CoV-2/FLU/RSV  testing. Fact Sheet for Patients: PinkCheek.be Fact Sheet for Healthcare Providers: GravelBags.it This test is not yet approved or cleared by the [place] FDA and  has been authorized for detection and/or diagnosis of SARS-CoV-2 by  FDA under an Emergency Use Authorization (EUA). This EUA will remain  in effect (meaning this test can be used) for the duration of the  Covid-19 declaration under Section 564(b)(1) of the Act, 21  U.S.C. section 360bbb-3(b)(1), unless the  authorization is  terminated or revoked. Performed at [facility], [place] [address], [place], [place] [address]   MRSA PCR Screening     Status: None   Collection Time: 02/28/19 11:38 PM   Specimen: Nasal Mucosa; Nasopharyngeal  Result Value Ref Range Status   MRSA by PCR NEGATIVE NEGATIVE Final    Comment:        The GeneXpert MRSA Assay (FDA approved for NASAL specimens only), is one component of a comprehensive MRSA colonization surveillance program. It is not intended to diagnose MRSA infection nor to guide or monitor treatment for MRSA infections. Performed at [facility], [place] [address] [place], [place] [address]      Studies: CT Angio Chest PE W and/or Wo Contrast  Result Date: 02/28/2019 CLINICAL DATA:  Pulmonary embolism on CT abdomen today EXAM: CT ANGIOGRAPHY CHEST WITH CONTRAST TECHNIQUE: Multidetector CT imaging of the chest was performed using the standard protocol during bolus administration of intravenous contrast. Multiplanar CT image reconstructions and MIPs were obtained to evaluate the vascular anatomy. CONTRAST:  50 mL Isovue 370 IV COMPARISON:  CT abdomen pelvis today. FINDINGS: Cardiovascular: Acute pulmonary embolism in the right main pulmonary artery extending into the right lower lobe pulmonary arteries. Relatively extensive thrombus is noted. Small amount of thrombus extending into the right middle lobe pulmonary artery. Right upper lobe clear for emboli. No emboli on the left. Heart size normal. RV LV ratio 0.96, upper normal. No pericardial effusion. Atherosclerotic aortic arch. No aortic aneurysm or dissection. Mediastinum/Nodes: Moderately large hiatal hernia. No mediastinal mass or adenopathy. Lungs/Pleura: Small right effusion. Right lower lobe airspace disease with elevated right hemidiaphragm. Left lung clear. Cystic change in the right upper lobe with surrounding soft tissue thickening, measuring approximately 3.5 cm in diameter. This is not  present on the prior chest CT of 07/28/2011. Associated fissural thickening. Upper Abdomen: No acute abnormality. Musculoskeletal: Multiple thoracic fractures including T7, T8, T9, T10, T11, T12 of indeterminate age. Review of the MIP images confirms the above findings. IMPRESSION: Acute pulmonary embolism primarily in the right lower lobe but also in the right middle lobe. No embolism on the left. Moderate clot volume. RV LV ratio upper normal 0.96 Patchy right lower lobe airspace disease possibly atelectasis, pneumonia, or pulmonary infarct. Small right effusion Cystic change right upper lobe with surrounding soft tissue thickening. This could be due to acute or chronic infection. Follow-up recommended. Aortic Atherosclerosis (ICD10-I70.0). Electronically Signed   By: Franchot Gallo M.D.   On: 02/28/2019 18:26   CT ABDOMEN PELVIS W CONTRAST  Result Date: 02/28/2019 CLINICAL DATA:  Abdominal pain and fever. EXAM: CT ABDOMEN AND PELVIS WITH CONTRAST TECHNIQUE: Multidetector CT imaging of the abdomen and pelvis was performed using the standard protocol following bolus administration  of intravenous contrast. CONTRAST:  85mL OMNIPAQUE IOHEXOL 300 MG/ML  SOLN COMPARISON:  MRI lumbar spine 01/06/2011 FINDINGS: Lower chest: Small right pleural effusion with mild right lower lobe atelectasis. Right middle lobe atelectasis. Acute pulmonary emboli in the right lower lobe pulmonary artery. Incomplete evaluation for pulmonary embolism. Hepatobiliary: No focal liver abnormality is seen. No gallstones, gallbladder wall thickening, or biliary dilatation. Pancreas: Negative Spleen: Negative Adrenals/Urinary Tract: Adrenal glands are unremarkable. Kidneys are normal, without renal calculi, focal lesion, or hydronephrosis. Bladder is unremarkable. Stomach/Bowel: Moderately large hiatal hernia. Negative for bowel obstruction. Negative for bowel mass or edema. Incomplete bowel rotation with the cecum in the left upper quadrant.  Prominent ileocecal valve. Appendix nonvisualized. Sigmoid diverticulosis without diverticulitis. Vascular/Lymphatic: Atherosclerotic disease with diffuse aortic and iliac calcifications. No aneurysm. No enlarged lymph nodes. Reproductive: Uterus and bilateral adnexa are unremarkable. Other: Small umbilical hernia.  No free fluid. Musculoskeletal: Multiple compression fractures including T7, T8, T9, T10, T12, L1, L3, L4. Previously noted MRI 2012 demonstrated fractures at T12 and L1. IMPRESSION: 1. Pulmonary embolism right lower lobe pulmonary artery. Incomplete evaluation of the pulmonary arteries. Small right pleural effusion and right lower lobe airspace disease. Possible pulmonary infarct. 2. No acute abnormality in the abdomen 3. Numerous compression fractures in the thoracic and lumbar spine of indeterminate age. 4. These results were called by telephone at the time of interpretation on 02/28/2019 at 4:01 pm to provider [facility] RAY , who verbally acknowledged these results. Electronically Signed   By: Franchot Gallo M.D.   On: 02/28/2019 16:01   DG Chest Port 1 View  Result Date: 02/28/2019 CLINICAL DATA:  Chest pain today. EXAM: PORTABLE CHEST 1 VIEW COMPARISON:  PA and lateral chest 04/13/2017. FINDINGS: The patient is rotated to the left. There is subsegmental atelectasis in the right mid and lower lung zones. Lungs otherwise clear. Heart size is normal. Atherosclerosis. No pneumothorax or pleural fluid. No acute or focal bony abnormality. IMPRESSION: No acute disease. Electronically Signed   By: Inge Rise M.D.   On: 02/28/2019 12:35   ECHOCARDIOGRAM COMPLETE  Result Date: 03/01/2019   ECHOCARDIOGRAM REPORT   Patient Name:   PRARTHANA MAZZARO Date of Exam: 03/01/2019 Medical Rec #:  [id number]         Height:       62.0 in Accession #:    [id number]        Weight:       123.0 lb Date of Birth:  1926-11-28         BSA:          1.55 m Patient Age:    71 years          BP:           127/63 mmHg  Patient Gender: F                 HR:           70 bpm. Exam Location:  Inpatient Procedure: 2D Echo, Color Doppler and Cardiac Doppler Indications:    I26.02 Pulmonary embolus  History:        Patient has prior history of Echocardiogram examinations, most                 recent 06/18/2016. Signs/Symptoms:Dementia; Risk                 Factors:Dyslipidemia.  Sonographer:    Raquel Sarna Senior RDCS Referring Phys: [place]  Sonographer Comments: Technically difficult  study. Patient confused and uncooperative, laying on right side. IMPRESSIONS  1. Very technically difficult study with limited images as patient refused to cooperate. Recommend repeating TTE once patient able to cooperate.  2. Left ventricle is poorly visualized. Grossly normal global systolic function, unable to assess for regional wall motion abnormalities  3. Right ventricle is poorly visualized. Function appears mildly reduced and there is the suggestion of free wall hypokinesis with preserved apical function (McConnell's sign, as can be seen in acute PE), though free wall is not well-visualized. The right ventricular size is normal.  4. The aortic valve was not well visualized. Aortic valve regurgitation is not visualized. No evidence of aortic valve stenosis.  5. The mitral valve was not well visualized. No evidence of mitral valve regurgitation.  6. The tricuspid valve is not well visualized. Tricuspid valve regurgitation is trivial FINDINGS  Left Ventricle: Left ventricular ejection fraction, by visual estimation, is 60 to 65%. The left ventricle has normal function. The left ventricle is not well visualized. There is mildly increased left ventricular hypertrophy. Right Ventricle: The right ventricular size is normal. Right vetricular wall thickness was not assessed. Global RV systolic function is has mildly reduced systolic function. The tricuspid regurgitant velocity is 2.05 m/s, and with an assumed right atrial  pressure of 8 mmHg,  the estimated right ventricular systolic pressure is normal at 24.8 mmHg. Left Atrium: Left atrial size was normal in size. Right Atrium: Right atrial size was normal in size Pericardium: Trivial pericardial effusion is present. Mitral Valve: The mitral valve was not well visualized. No evidence of mitral valve regurgitation. Tricuspid Valve: The tricuspid valve is not well visualized. Tricuspid valve regurgitation is trivial. Aortic Valve: The aortic valve was not well visualized. Aortic valve regurgitation is not visualized. The aortic valve is structurally normal, with no evidence of sclerosis or stenosis. Pulmonic Valve: The pulmonic valve was not well visualized. Pulmonic valve regurgitation is not visualized. Pulmonic regurgitation is not visualized. Aorta: The aortic root was not well visualized. IAS/Shunts: The interatrial septum was not well visualized.  LEFT VENTRICLE PLAX 2D LVOT diam:     2.00 cm LVOT Area:     3.14 cm  LEFT ATRIUM           Index       RIGHT ATRIUM           Index LA Vol (A4C): 41.0 ml 26.37 ml/m RA Area:     17.30 cm                                   RA Volume:   42.20 ml  27.14 ml/m  AORTIC VALVE LVOT Vmax:   68.00 cm/s LVOT Vmean:  45.900 cm/s LVOT VTI:    0.151 m MITRAL VALVE                        TRICUSPID VALVE MV Area (PHT): 2.32 cm             TR Peak grad:   16.8 mmHg MV PHT:        94.83 msec           TR Vmax:        205.00 cm/s MV Decel Time: 327 msec MV E velocity: 52.10 cm/s 103 cm/s  SHUNTS MV A velocity: 85.90 cm/s 70.3 cm/s Systemic VTI:  0.15 m MV E/A ratio:  0.61       1.5       Systemic Diam: 2.00 cm  Oswaldo Milian MD Electronically signed by Oswaldo Milian MD Signature Date/Time: 03/01/2019/12:10:01 PM    Final    VAS [place] LOWER EXTREMITY VENOUS (DVT)  Result Date: 03/01/2019  Lower Venous DVTStudy Indications: Pulmonary embolism.  Comparison Study: no prior Performing Technologist: Abram Sander RVS  Examination Guidelines: A complete evaluation  includes B-mode imaging, spectral Doppler, color Doppler, and power Doppler as needed of all accessible portions of each vessel. Bilateral testing is considered an integral part of a complete examination. Limited examinations for reoccurring indications may be performed as noted. The reflux portion of the exam is performed with the patient in reverse Trendelenburg.  +---------+---------------+---------+-----------+----------+-----------------+  RIGHT     Compressibility Phasicity Spontaneity Properties Thrombus Aging     +---------+---------------+---------+-----------+----------+-----------------+  CFV       None            Yes       Yes                    Age Indeterminate  +---------+---------------+---------+-----------+----------+-----------------+  SFJ       Full                                                                +---------+---------------+---------+-----------+----------+-----------------+  FV Prox   Full                                                                +---------+---------------+---------+-----------+----------+-----------------+  FV Mid    Full                                                                +---------+---------------+---------+-----------+----------+-----------------+  FV Distal Full                                                                +---------+---------------+---------+-----------+----------+-----------------+  PFV       None                                                                +---------+---------------+---------+-----------+----------+-----------------+  POP       Full            Yes       Yes                                       +---------+---------------+---------+-----------+----------+-----------------+  PTV       Full                                                                +---------+---------------+---------+-----------+----------+-----------------+  PERO                                                       Not visualized      +---------+---------------+---------+-----------+----------+-----------------+  EIV                       Yes       Yes                                       +---------+---------------+---------+-----------+----------+-----------------+   +---------+---------------+---------+-----------+----------+--------------+  LEFT      Compressibility Phasicity Spontaneity Properties Thrombus Aging  +---------+---------------+---------+-----------+----------+--------------+  CFV       Full            Yes       Yes                                    +---------+---------------+---------+-----------+----------+--------------+  SFJ       Full                                                             +---------+---------------+---------+-----------+----------+--------------+  FV Prox   Full                                                             +---------+---------------+---------+-----------+----------+--------------+  FV Mid    Full                                                             +---------+---------------+---------+-----------+----------+--------------+  FV Distal Full                                                             +---------+---------------+---------+-----------+----------+--------------+  PFV       Full                                                             +---------+---------------+---------+-----------+----------+--------------+  POP       Full            Yes       Yes                                    +---------+---------------+---------+-----------+----------+--------------+  PTV       Full                                                             +---------+---------------+---------+-----------+----------+--------------+  PERO      Full                                                             +---------+---------------+---------+-----------+----------+--------------+     Summary: RIGHT: - Findings consistent with acute deep vein thrombosis involving the right common femoral vein,  and right proximal profunda vein. - No cystic structure found in the popliteal fossa. - Obtained dopplers in the EIV.  LEFT: - There is no evidence of deep vein thrombosis in the lower extremity.  - No cystic structure found in the popliteal fossa.  *See table(s) above for measurements and observations.    Preliminary       Flora Lipps, MD  [facility] 03/01/2019

## 2019-03-01 NOTE — Progress Notes (Signed)
ANTICOAGULATION CONSULT NOTE - Follow Up Consult  Pharmacy Consult for heparin Indication: pulmonary embolus  Labs: Recent Labs    02/28/19 1148 02/28/19 1349 02/28/19 2317 03/01/19 0007  HGB 11.9*  --   --   --   HCT 37.4  --   --   --   PLT 234  --   --   --   HEPARINUNFRC  --   --   --  0.62  CREATININE 1.15*  --   --   --   TROPONINIHS 8 14 15   --     Assessment/Plan:  86yo female therapeutic on heparin with initial dosing for PE. Will continue gtt at current rate and confirm stable with am labs.   Wynona Neat, PharmD, BCPS  03/01/2019,1:00 AM

## 2019-03-02 LAB — CBC
HCT: 28.6 % — ABNORMAL LOW (ref 36.0–46.0)
Hemoglobin: 9.2 g/dL — ABNORMAL LOW (ref 12.0–15.0)
MCH: 30.9 pg (ref 26.0–34.0)
MCHC: 32.2 g/dL (ref 30.0–36.0)
MCV: 96 fL (ref 80.0–100.0)
Platelets: 255 10*3/uL (ref 150–400)
RBC: 2.98 MIL/uL — ABNORMAL LOW (ref 3.87–5.11)
RDW: 13.2 % (ref 11.5–15.5)
WBC: 9.6 10*3/uL (ref 4.0–10.5)
nRBC: 0 % (ref 0.0–0.2)

## 2019-03-02 NOTE — Progress Notes (Signed)
PROGRESS NOTE  Dana Bush [id number] DOB: 04-27-1926 DOA: 02/28/2019 PCP: Deland Pretty, MD   LOS: 2 days   Brief narrative: Patient is a 86 year old Caucasian female with past medical history significant for advanced dementia, hypertension, hyperlipidemia and osteoporosis.  Patient presented to the hospital with chest pain, nausea and vomiting.  Patient was also mildly tachycardic on presentation.  CT abdomen pelvis and CT angiogram of the chest was done which revealed pulmonary embolism with moderate clot burden.  Patient was admitted for further assessment and management.  Patient was started on heparin drip, and none known direct acting anticoagulation.Doppler ultrasound of the lower extremities revealed acute deep vein thrombosis involving the right common femoral vein and right proximal profunda vein.  Plan is to discharge patient to skilled nursing facility when a bed is available.  Assessment/Plan:  Principal Problem:   Acute pulmonary embolism (HCC) Active Problems:   Dementia ([place])   ARF (acute renal failure) ([place])   Acute pulmonary embolism: -Work-up revealed moderate clot burden.   -Patient is mostly bedbound and is on a wheelchair.  -2D echo did not reveal right ventricular strain.   -Venous Dopplers of the lower extremity revealed right lower extremity DVT.   -Patient was initially on heparin drip, but none on direct acting oral anticoagulation.   -Patient will be discharged to skilled nursing facility when a bed becomes available.   -Patient is stable for discharge.    Right extremity DVT: Kindly see above.   Dementia: -On Aricept.   -No behavioral problem.    Leukocytosis: -Likely reactive.  -Leukocytosis has resolved.   Depression on citalopram.  VTE Prophylaxis: Heparin drip changed to Eliquis.   Code Status: DNR  Family Communication:   Disposition Plan:  . Patient is from assisted living facility. . As per physical therapy's note, patient  be discharged to skilled nursing facility. Patient will be discharged when a bed becomes available.  Consultants:  None  Procedures:  None  Antibiotics:  . None  Anti-infectives (From admission, onward)   Start     Dose/Rate Route Frequency Ordered Stop   03/01/19 0800  doxycycline (VIBRAMYCIN) 100 mg in sodium chloride 0.9 % 250 mL IVPB  Status:  Discontinued     100 mg 125 mL/hr over 120 Minutes Intravenous Every 12 hours 03/01/19 0724 03/01/19 1541     Subjective: No complaints from patient.  Objective: Vitals:   03/02/19 0429 03/02/19 0733  BP: (!) 107/50 (!) 100/48  Pulse: 62 (!) 59  Resp: 19 18  Temp: 97.9 F (36.6 C) 98.4 F (36.9 C)  SpO2: 94% 98%    Intake/Output Summary (Last 24 hours) at 03/02/2019 1157 Last data filed at 03/02/2019 0900 Gross per 24 hour  Intake 485.55 ml  Output 350 ml  Net 135.55 ml   Filed Weights   03/01/19 0059 03/01/19 1015 03/02/19 0429  Weight: 55.8 kg 56.7 kg 54.3 kg   Body mass index is 21.9 kg/m.   Physical Exam: GENERAL: Patient is awake and alert.  Patient is not in any obvious distress.  HEENT: No scleral pallor or icterus. Pupils equally reactive to light. Oral mucosa is moist NECK: Supple.  CHEST: Clear to auscultation.  CVS: S1 and S2 heard.  ABDOMEN: Soft, non-tender, bowel sounds are present. EXTREMITIES: No edema.  Data Review: I have personally reviewed the following laboratory data and studies,  CBC: Recent Labs  Lab 02/28/19 1148 03/01/19 0503 03/02/19 0410  WBC 16.1* 15.1* 9.6  HGB 11.9* 10.3* 9.2*  HCT 37.4 31.7* 28.6*  MCV 97.7 94.9 96.0  PLT 234 208 [id number]   Basic Metabolic Panel: Recent Labs  Lab 02/28/19 1148 03/01/19 0503  NA 139 140  K 4.5 3.9  CL 108 111  CO2 18* 19*  GLUCOSE 173* 116*  BUN 16 15  CREATININE 1.15* 1.10*  CALCIUM 9.8 9.2   Liver Function Tests: Recent Labs  Lab 02/28/19 1148  AST 26  ALT 23  ALKPHOS 53  BILITOT 1.0  PROT 7.4  ALBUMIN 3.2*   Recent  Labs  Lab 02/28/19 1148  LIPASE 34   No results for input(s): AMMONIA in the last 168 hours. Cardiac Enzymes: No results for input(s): CKTOTAL, CKMB, CKMBINDEX, TROPONINI in the last 168 hours. BNP (last 3 results) No results for input(s): BNP in the last 8760 hours.  ProBNP (last 3 results) No results for input(s): PROBNP in the last 8760 hours.  CBG: Recent Labs  Lab 02/28/19 1249 03/01/19 1106  GLUCAP 127* 114*   Recent Results (from the past 240 hour(s))  Respiratory Panel by RT PCR (Flu A&B, Covid) - Nasopharyngeal Swab     Status: None   Collection Time: 02/28/19  8:01 PM   Specimen: Nasopharyngeal Swab  Result Value Ref Range Status   SARS Coronavirus 2 by RT PCR NEGATIVE NEGATIVE Final    Comment: (NOTE) SARS-CoV-2 target nucleic acids are NOT DETECTED. The SARS-CoV-2 RNA is generally detectable in upper respiratoy specimens during the acute phase of infection. The lowest concentration of SARS-CoV-2 viral copies this assay can detect is 131 copies/mL. A negative result does not preclude SARS-Cov-2 infection and should not be used as the sole basis for treatment or other patient management decisions. A negative result may occur with  improper specimen collection/handling, submission of specimen other than nasopharyngeal swab, presence of viral mutation(s) within the areas targeted by this assay, and inadequate number of viral copies (<131 copies/mL). A negative result must be combined with clinical observations, patient history, and epidemiological information. The expected result is Negative. Fact Sheet for Patients:  PinkCheek.be Fact Sheet for Healthcare Providers:  GravelBags.it This test is not yet ap proved or cleared by the [place] FDA and  has been authorized for detection and/or diagnosis of SARS-CoV-2 by FDA under an Emergency Use Authorization (EUA). This EUA will remain  in effect  (meaning this test can be used) for the duration of the COVID-19 declaration under Section 564(b)(1) of the Act, 21 U.S.C. section 360bbb-3(b)(1), unless the authorization is terminated or revoked sooner.    Influenza A by PCR NEGATIVE NEGATIVE Final   Influenza B by PCR NEGATIVE NEGATIVE Final    Comment: (NOTE) The Xpert Xpress SARS-CoV-2/FLU/RSV assay is intended as an aid in  the diagnosis of influenza from Nasopharyngeal swab specimens and  should not be used as a sole basis for treatment. Nasal washings and  aspirates are unacceptable for Xpert Xpress SARS-CoV-2/FLU/RSV  testing. Fact Sheet for Patients: PinkCheek.be Fact Sheet for Healthcare Providers: GravelBags.it This test is not yet approved or cleared by the [place] FDA and  has been authorized for detection and/or diagnosis of SARS-CoV-2 by  FDA under an Emergency Use Authorization (EUA). This EUA will remain  in effect (meaning this test can be used) for the duration of the  Covid-19 declaration under Section 564(b)(1) of the Act, 21  U.S.C. section 360bbb-3(b)(1), unless the authorization is  terminated or revoked. Performed at [facility], [place] [address], [place], [place] [address]  MRSA PCR Screening     Status: None   Collection Time: 02/28/19 11:38 PM   Specimen: Nasal Mucosa; Nasopharyngeal  Result Value Ref Range Status   MRSA by PCR NEGATIVE NEGATIVE Final    Comment:        The GeneXpert MRSA Assay (FDA approved for NASAL specimens only), is one component of a comprehensive MRSA colonization surveillance program. It is not intended to diagnose MRSA infection nor to guide or monitor treatment for MRSA infections. Performed at [facility], [place] [address] [place],  [address]      Studies: CT Angio Chest PE W and/or Wo Contrast  Result Date: 02/28/2019 CLINICAL DATA:  Pulmonary embolism on CT abdomen today EXAM: CT  ANGIOGRAPHY CHEST WITH CONTRAST TECHNIQUE: Multidetector CT imaging of the chest was performed using the standard protocol during bolus administration of intravenous contrast. Multiplanar CT image reconstructions and MIPs were obtained to evaluate the vascular anatomy. CONTRAST:  50 mL Isovue 370 IV COMPARISON:  CT abdomen pelvis today. FINDINGS: Cardiovascular: Acute pulmonary embolism in the right main pulmonary artery extending into the right lower lobe pulmonary arteries. Relatively extensive thrombus is noted. Small amount of thrombus extending into the right middle lobe pulmonary artery. Right upper lobe clear for emboli. No emboli on the left. Heart size normal. RV LV ratio 0.96, upper normal. No pericardial effusion. Atherosclerotic aortic arch. No aortic aneurysm or dissection. Mediastinum/Nodes: Moderately large hiatal hernia. No mediastinal mass or adenopathy. Lungs/Pleura: Small right effusion. Right lower lobe airspace disease with elevated right hemidiaphragm. Left lung clear. Cystic change in the right upper lobe with surrounding soft tissue thickening, measuring approximately 3.5 cm in diameter. This is not present on the prior chest CT of 07/28/2011. Associated fissural thickening. Upper Abdomen: No acute abnormality. Musculoskeletal: Multiple thoracic fractures including T7, T8, T9, T10, T11, T12 of indeterminate age. Review of the MIP images confirms the above findings. IMPRESSION: Acute pulmonary embolism primarily in the right lower lobe but also in the right middle lobe. No embolism on the left. Moderate clot volume. RV LV ratio upper normal 0.96 Patchy right lower lobe airspace disease possibly atelectasis, pneumonia, or pulmonary infarct. Small right effusion Cystic change right upper lobe with surrounding soft tissue thickening. This could be due to acute or chronic infection. Follow-up recommended. Aortic Atherosclerosis (ICD10-I70.0). Electronically Signed   By: Franchot Gallo M.D.   On:  02/28/2019 18:26   CT ABDOMEN PELVIS W CONTRAST  Result Date: 02/28/2019 CLINICAL DATA:  Abdominal pain and fever. EXAM: CT ABDOMEN AND PELVIS WITH CONTRAST TECHNIQUE: Multidetector CT imaging of the abdomen and pelvis was performed using the standard protocol following bolus administration of intravenous contrast. CONTRAST:  45mL OMNIPAQUE IOHEXOL 300 MG/ML  SOLN COMPARISON:  MRI lumbar spine 01/06/2011 FINDINGS: Lower chest: Small right pleural effusion with mild right lower lobe atelectasis. Right middle lobe atelectasis. Acute pulmonary emboli in the right lower lobe pulmonary artery. Incomplete evaluation for pulmonary embolism. Hepatobiliary: No focal liver abnormality is seen. No gallstones, gallbladder wall thickening, or biliary dilatation. Pancreas: Negative Spleen: Negative Adrenals/Urinary Tract: Adrenal glands are unremarkable. Kidneys are normal, without renal calculi, focal lesion, or hydronephrosis. Bladder is unremarkable. Stomach/Bowel: Moderately large hiatal hernia. Negative for bowel obstruction. Negative for bowel mass or edema. Incomplete bowel rotation with the cecum in the left upper quadrant. Prominent ileocecal valve. Appendix nonvisualized. Sigmoid diverticulosis without diverticulitis. Vascular/Lymphatic: Atherosclerotic disease with diffuse aortic and iliac calcifications. No aneurysm. No enlarged lymph nodes. Reproductive: Uterus and bilateral adnexa  are unremarkable. Other: Small umbilical hernia.  No free fluid. Musculoskeletal: Multiple compression fractures including T7, T8, T9, T10, T12, L1, L3, L4. Previously noted MRI 2012 demonstrated fractures at T12 and L1. IMPRESSION: 1. Pulmonary embolism right lower lobe pulmonary artery. Incomplete evaluation of the pulmonary arteries. Small right pleural effusion and right lower lobe airspace disease. Possible pulmonary infarct. 2. No acute abnormality in the abdomen 3. Numerous compression fractures in the thoracic and lumbar spine  of indeterminate age. 4. These results were called by telephone at the time of interpretation on 02/28/2019 at 4:01 pm to provider [facility] RAY , who verbally acknowledged these results. Electronically Signed   By: Franchot Gallo M.D.   On: 02/28/2019 16:01   DG Chest Port 1 View  Result Date: 02/28/2019 CLINICAL DATA:  Chest pain today. EXAM: PORTABLE CHEST 1 VIEW COMPARISON:  PA and lateral chest 04/13/2017. FINDINGS: The patient is rotated to the left. There is subsegmental atelectasis in the right mid and lower lung zones. Lungs otherwise clear. Heart size is normal. Atherosclerosis. No pneumothorax or pleural fluid. No acute or focal bony abnormality. IMPRESSION: No acute disease. Electronically Signed   By: Inge Rise M.D.   On: 02/28/2019 12:35   ECHOCARDIOGRAM COMPLETE  Result Date: 03/01/2019   ECHOCARDIOGRAM REPORT   Patient Name:   Dana Bush Date of Exam: 03/01/2019 Medical Rec #:  [id number]         Height:       62.0 in Accession #:    [id number]        Weight:       123.0 lb Date of Birth:  01-13-1927         BSA:          1.55 m Patient Age:    54 years          BP:           127/63 mmHg Patient Gender: F                 HR:           70 bpm. Exam Location:  Inpatient Procedure: 2D Echo, Color Doppler and Cardiac Doppler Indications:    I26.02 Pulmonary embolus  History:        Patient has prior history of Echocardiogram examinations, most                 recent 06/18/2016. Signs/Symptoms:Dementia; Risk                 Factors:Dyslipidemia.  Sonographer:    Raquel Sarna Senior RDCS Referring Phys: [place]  Sonographer Comments: Technically difficult study. Patient confused and uncooperative, laying on right side. IMPRESSIONS  1. Very technically difficult study with limited images as patient refused to cooperate. Recommend repeating TTE once patient able to cooperate.  2. Left ventricle is poorly visualized. Grossly normal global systolic function, unable to assess for regional  wall motion abnormalities  3. Right ventricle is poorly visualized. Function appears mildly reduced and there is the suggestion of free wall hypokinesis with preserved apical function (McConnell's sign, as can be seen in acute PE), though free wall is not well-visualized. The right ventricular size is normal.  4. The aortic valve was not well visualized. Aortic valve regurgitation is not visualized. No evidence of aortic valve stenosis.  5. The mitral valve was not well visualized. No evidence of mitral valve regurgitation.  6. The tricuspid valve is not well visualized. Tricuspid  valve regurgitation is trivial FINDINGS  Left Ventricle: Left ventricular ejection fraction, by visual estimation, is 60 to 65%. The left ventricle has normal function. The left ventricle is not well visualized. There is mildly increased left ventricular hypertrophy. Right Ventricle: The right ventricular size is normal. Right vetricular wall thickness was not assessed. Global RV systolic function is has mildly reduced systolic function. The tricuspid regurgitant velocity is 2.05 m/s, and with an assumed right atrial  pressure of 8 mmHg, the estimated right ventricular systolic pressure is normal at 24.8 mmHg. Left Atrium: Left atrial size was normal in size. Right Atrium: Right atrial size was normal in size Pericardium: Trivial pericardial effusion is present. Mitral Valve: The mitral valve was not well visualized. No evidence of mitral valve regurgitation. Tricuspid Valve: The tricuspid valve is not well visualized. Tricuspid valve regurgitation is trivial. Aortic Valve: The aortic valve was not well visualized. Aortic valve regurgitation is not visualized. The aortic valve is structurally normal, with no evidence of sclerosis or stenosis. Pulmonic Valve: The pulmonic valve was not well visualized. Pulmonic valve regurgitation is not visualized. Pulmonic regurgitation is not visualized. Aorta: The aortic root was not well visualized.  IAS/Shunts: The interatrial septum was not well visualized.  LEFT VENTRICLE PLAX 2D LVOT diam:     2.00 cm LVOT Area:     3.14 cm  LEFT ATRIUM           Index       RIGHT ATRIUM           Index LA Vol (A4C): 41.0 ml 26.37 ml/m RA Area:     17.30 cm                                   RA Volume:   42.20 ml  27.14 ml/m  AORTIC VALVE LVOT Vmax:   68.00 cm/s LVOT Vmean:  45.900 cm/s LVOT VTI:    0.151 m MITRAL VALVE                        TRICUSPID VALVE MV Area (PHT): 2.32 cm             TR Peak grad:   16.8 mmHg MV PHT:        94.83 msec           TR Vmax:        205.00 cm/s MV Decel Time: 327 msec MV E velocity: 52.10 cm/s 103 cm/s  SHUNTS MV A velocity: 85.90 cm/s 70.3 cm/s Systemic VTI:  0.15 m MV E/A ratio:  0.61       1.5       Systemic Diam: 2.00 cm  Oswaldo Milian MD Electronically signed by Oswaldo Milian MD Signature Date/Time: 03/01/2019/12:10:01 PM    Final    VAS [place] LOWER EXTREMITY VENOUS (DVT)  Result Date: 03/01/2019  Lower Venous DVTStudy Indications: Pulmonary embolism.  Comparison Study: no prior Performing Technologist: Abram Sander RVS  Examination Guidelines: A complete evaluation includes B-mode imaging, spectral Doppler, color Doppler, and power Doppler as needed of all accessible portions of each vessel. Bilateral testing is considered an integral part of a complete examination. Limited examinations for reoccurring indications may be performed as noted. The reflux portion of the exam is performed with the patient in reverse Trendelenburg.  +---------+---------------+---------+-----------+----------+-----------------+ RIGHT    CompressibilityPhasicitySpontaneityPropertiesThrombus Aging    +---------+---------------+---------+-----------+----------+-----------------+ CFV  None           Yes      Yes                  Age Indeterminate +---------+---------------+---------+-----------+----------+-----------------+ SFJ      Full                                                            +---------+---------------+---------+-----------+----------+-----------------+ FV Prox  Full                                                           +---------+---------------+---------+-----------+----------+-----------------+ FV Mid   Full                                                           +---------+---------------+---------+-----------+----------+-----------------+ FV DistalFull                                                           +---------+---------------+---------+-----------+----------+-----------------+ PFV      None                                                           +---------+---------------+---------+-----------+----------+-----------------+ POP      Full           Yes      Yes                                    +---------+---------------+---------+-----------+----------+-----------------+ PTV      Full                                                           +---------+---------------+---------+-----------+----------+-----------------+ PERO                                                  Not visualized    +---------+---------------+---------+-----------+----------+-----------------+ EIV                     Yes      Yes                                    +---------+---------------+---------+-----------+----------+-----------------+   +---------+---------------+---------+-----------+----------+--------------+  LEFT     CompressibilityPhasicitySpontaneityPropertiesThrombus Aging +---------+---------------+---------+-----------+----------+--------------+ CFV      Full           Yes      Yes                                 +---------+---------------+---------+-----------+----------+--------------+ SFJ      Full                                                        +---------+---------------+---------+-----------+----------+--------------+ FV Prox  Full                                                         +---------+---------------+---------+-----------+----------+--------------+ FV Mid   Full                                                        +---------+---------------+---------+-----------+----------+--------------+ FV DistalFull                                                        +---------+---------------+---------+-----------+----------+--------------+ PFV      Full                                                        +---------+---------------+---------+-----------+----------+--------------+ POP      Full           Yes      Yes                                 +---------+---------------+---------+-----------+----------+--------------+ PTV      Full                                                        +---------+---------------+---------+-----------+----------+--------------+ PERO     Full                                                        +---------+---------------+---------+-----------+----------+--------------+     Summary: RIGHT: - Findings consistent with acute deep vein thrombosis involving the right common femoral vein, and right proximal profunda vein. - No cystic structure found in the popliteal fossa. - Obtained dopplers in the EIV.  LEFT: - There is no evidence  of deep vein thrombosis in the lower extremity.  - No cystic structure found in the popliteal fossa.  *See table(s) above for measurements and observations.    Preliminary       Bonnell Public, MD  [facility] 03/02/2019

## 2019-03-02 NOTE — Evaluation (Signed)
Physical Therapy Evaluation Patient Details Name: Dana Bush MRN: [id number] DOB: 1926-07-01 Today's Date: 03/02/2019   History of Present Illness  86 y.o. female with history of advanced dementia, hypertension, hyperlipidemia, osteoporosis presented to the hospital with chest pain nausea and vomiting.    Clinical Impression  Pt admitted with above diagnosis. PTA pt resided in memory care unit at [facility]. Unsure if residence is classified as ALF or SNF. Per chart, pt primarily wheelchair and bedbound at baseline. On eval, pt required min assist bed mobility and min assist sit to stand. Pt declining further mobility progression due to fatigue/lethargy and requesting return to bed. Lunch tray present in room but pt declining. Pt currently with functional limitations due to the deficits listed below (see PT Problem List). Pt will benefit from skilled PT to increase their independence and safety with mobility to allow discharge to the venue listed below.  If pt's previous residence is ALF, she may return if staff able to provide needed level of assist. Pt would then need HHPT.     Follow Up Recommendations SNF;Supervision/Assistance - 24 hour    Equipment Recommendations  Other (comment)(defer to next venue)    Recommendations for Other Services       Precautions / Restrictions Precautions Precautions: Fall      Mobility  Bed Mobility Overal bed mobility: Needs Assistance Bed Mobility: Supine to Sit;Sit to Supine     Supine to sit: Min assist;HOB elevated Sit to supine: Min assist;HOB elevated   General bed mobility comments: assist with LE off bed and to elevate trunk. Encouragement needed to participate.  Transfers Overall transfer level: Needs assistance   Transfers: Sit to/from Stand Sit to Stand: Mod assist         General transfer comment: attempted sit to stand. Pt able to clear bottom from bed but then sat back down, stating "I'm just so tired right  now."  Ambulation/Gait                Stairs            Wheelchair Mobility    Modified Rankin (Stroke Patients Only)       Balance                                             Pertinent Vitals/Pain Pain Assessment: No/denies pain    Home Living Family/patient expects to be discharged to:: Skilled nursing facility                 Additional Comments: [name]-memory care unit    Prior Function Level of Independence: Needs assistance   Gait / Transfers Assistance Needed: Per chart, pt primarily wheelchair and bedbound  ADL's / Homemaking Assistance Needed: staff assists with ADLs. Unsure of assist level needed.        Hand Dominance   Dominant Hand: Right    Extremity/Trunk Assessment   Upper Extremity Assessment Upper Extremity Assessment: Generalized weakness    Lower Extremity Assessment Lower Extremity Assessment: Generalized weakness    Cervical / Trunk Assessment Cervical / Trunk Assessment: Kyphotic  Communication   Communication: No difficulties  Cognition Arousal/Alertness: Lethargic Behavior During Therapy: Flat affect Overall Cognitive Status: History of cognitive impairments - at baseline  General Comments: advanced dementia at baseline. Pt very sleepy. Minimal participation.      General Comments      Exercises     Assessment/Plan    PT Assessment Patient needs continued PT services  PT Problem List Decreased strength;Decreased mobility;Decreased activity tolerance;Decreased balance;Decreased cognition       PT Treatment Interventions Therapeutic activities;Gait training;Therapeutic exercise;Patient/family education;Balance training;Functional mobility training    PT Goals (Current goals can be found in the Care Plan section)  Acute Rehab PT Goals Patient Stated Goal: not stated PT Goal Formulation: Patient unable to participate in goal  setting Time For Goal Achievement: 03/16/19 Potential to Achieve Goals: Fair    Frequency Min 2X/week   Barriers to discharge        Co-evaluation               AM-PAC PT "6 Clicks" Mobility  Outcome Measure Help needed turning from your back to your side while in a flat bed without using bedrails?: A Little Help needed moving from lying on your back to sitting on the side of a flat bed without using bedrails?: A Little Help needed moving to and from a bed to a chair (including a wheelchair)?: A Lot Help needed standing up from a chair using your arms (e.g., wheelchair or bedside chair)?: A Lot Help needed to walk in hospital room?: A Lot Help needed climbing 3-5 steps with a railing? : Total 6 Click Score: 13    End of Session   Activity Tolerance: Patient limited by lethargy Patient left: in bed;with bed alarm set;with call bell/phone within reach Nurse Communication: Mobility status PT Visit Diagnosis: Other abnormalities of gait and mobility (R26.89);Muscle weakness (generalized) (M62.81)    Time: [id number] PT Time Calculation (min) (ACUTE ONLY): 13 min   Charges:   PT Evaluation $PT Eval Moderate Complexity: 1 Mod          Lorrin Goodell, PT  Office # [phone] Pager [phone]   Dana Bush 03/02/2019, 1:07 PM

## 2019-03-03 LAB — CBC
HCT: 29.9 % — ABNORMAL LOW (ref 36.0–46.0)
Hemoglobin: 9.8 g/dL — ABNORMAL LOW (ref 12.0–15.0)
MCH: 30.9 pg (ref 26.0–34.0)
MCHC: 32.8 g/dL (ref 30.0–36.0)
MCV: 94.3 fL (ref 80.0–100.0)
Platelets: 289 10*3/uL (ref 150–400)
RBC: 3.17 MIL/uL — ABNORMAL LOW (ref 3.87–5.11)
RDW: 13 % (ref 11.5–15.5)
WBC: 6.6 10*3/uL (ref 4.0–10.5)
nRBC: 0 % (ref 0.0–0.2)

## 2019-03-03 MED ORDER — CITALOPRAM HYDROBROMIDE 20 MG PO TABS
20.0000 mg | ORAL_TABLET | Freq: Every day | ORAL | Status: DC
  Administered 2019-03-04: 20 mg via ORAL
  Filled 2019-03-03: qty 1

## 2019-03-03 NOTE — Progress Notes (Signed)
PROGRESS NOTE  Dana Bush [id number] DOB: Mar 24, 1926 DOA: 02/28/2019 PCP: Deland Pretty, MD   LOS: 3 days   Brief narrative: Patient is a 86 year old Caucasian female with past medical history significant for advanced dementia, hypertension, hyperlipidemia and osteoporosis.  Patient presented to the hospital with chest pain, nausea and vomiting.  Patient was also mildly tachycardic on presentation.  CT abdomen pelvis and CT angiogram of the chest was done which revealed pulmonary embolism with moderate clot burden.  Patient was admitted for further assessment and management.  Patient was started on heparin drip, and none known direct acting anticoagulation.  Doppler ultrasound of the lower extremities revealed acute deep vein thrombosis involving the right common femoral vein and right proximal profunda vein.  Plan is to discharge patient to skilled nursing facility when a bed is available.  03/03/2019: Patient seen.  No new symptoms.  Is awaiting disposition.  Hopefully, patient will be discharged tomorrow.   Assessment/Plan:  Principal Problem:   Acute pulmonary embolism (HCC) Active Problems:   Dementia ([place])   ARF (acute renal failure) ([place])   Acute pulmonary embolism: -Work-up revealed moderate clot burden.   -Patient is mostly bedbound and is on a wheelchair.  -2D echo did not reveal right ventricular strain.   -Venous Dopplers of the lower extremity revealed right lower extremity DVT.   -Patient was initially on heparin drip, but none on direct acting oral anticoagulation.   -Patient will be discharged to skilled nursing facility when a bed becomes available.   -Patient is stable for discharge.    Right extremity DVT: Kindly see above.   Dementia: -On Aricept.   -No behavioral problem.    Leukocytosis: -Likely reactive.  -Leukocytosis has resolved.   Depression on citalopram.  VTE Prophylaxis: Heparin drip changed to Eliquis.   Code Status: DNR  Family  Communication:   Disposition Plan:  . Patient is from assisted living facility. . As per physical therapy's note, patient be discharged to skilled nursing facility. Patient will be discharged when a bed becomes available.  Consultants:  None  Procedures:  None  Antibiotics:  . None  Anti-infectives (From admission, onward)   Start     Dose/Rate Route Frequency Ordered Stop   03/01/19 0800  doxycycline (VIBRAMYCIN) 100 mg in sodium chloride 0.9 % 250 mL IVPB  Status:  Discontinued     100 mg 125 mL/hr over 120 Minutes Intravenous Every 12 hours 03/01/19 0724 03/01/19 1541     Subjective: No shortness of breath.   No chest pain.  Objective: Vitals:   03/03/19 0414 03/03/19 1226  BP: 119/74 92/76  Pulse: 79 64  Resp: 16   Temp: 98.7 F (37.1 C) 98 F (36.7 C)  SpO2: 92% 94%    Intake/Output Summary (Last 24 hours) at 03/03/2019 1525 Last data filed at 03/03/2019 1429 Gross per 24 hour  Intake 500 ml  Output 250 ml  Net 250 ml   Filed Weights   03/01/19 1015 03/02/19 0429 03/03/19 0414  Weight: 56.7 kg 54.3 kg 56.1 kg   Body mass index is 22.62 kg/m.   Physical Exam: GENERAL: Patient is awake and alert.  Patient is not in any obvious distress.  HEENT: No scleral pallor or icterus. Pupils equally reactive to light. Oral mucosa is moist NECK: Supple.  CHEST: Clear to auscultation.  CVS: S1 and S2 heard.  ABDOMEN: Soft, non-tender, bowel sounds are present. EXTREMITIES: No edema.  Data Review: I have personally reviewed the following laboratory data  and studies,  CBC: Recent Labs  Lab 02/28/19 1148 03/01/19 0503 03/02/19 0410 03/03/19 0454  WBC 16.1* 15.1* 9.6 6.6  HGB 11.9* 10.3* 9.2* 9.8*  HCT 37.4 31.7* 28.6* 29.9*  MCV 97.7 94.9 96.0 94.3  PLT 234 208 255 [id number]   Basic Metabolic Panel: Recent Labs  Lab 02/28/19 1148 03/01/19 0503  NA 139 140  K 4.5 3.9  CL 108 111  CO2 18* 19*  GLUCOSE 173* 116*  BUN 16 15  CREATININE 1.15* 1.10*    CALCIUM 9.8 9.2   Liver Function Tests: Recent Labs  Lab 02/28/19 1148  AST 26  ALT 23  ALKPHOS 53  BILITOT 1.0  PROT 7.4  ALBUMIN 3.2*   Recent Labs  Lab 02/28/19 1148  LIPASE 34   No results for input(s): AMMONIA in the last 168 hours. Cardiac Enzymes: No results for input(s): CKTOTAL, CKMB, CKMBINDEX, TROPONINI in the last 168 hours. BNP (last 3 results) No results for input(s): BNP in the last 8760 hours.  ProBNP (last 3 results) No results for input(s): PROBNP in the last 8760 hours.  CBG: Recent Labs  Lab 02/28/19 1249 03/01/19 1106  GLUCAP 127* 114*   Recent Results (from the past 240 hour(s))  Respiratory Panel by RT PCR (Flu A&B, Covid) - Nasopharyngeal Swab     Status: None   Collection Time: 02/28/19  8:01 PM   Specimen: Nasopharyngeal Swab  Result Value Ref Range Status   SARS Coronavirus 2 by RT PCR NEGATIVE NEGATIVE Final    Comment: (NOTE) SARS-CoV-2 target nucleic acids are NOT DETECTED. The SARS-CoV-2 RNA is generally detectable in upper respiratoy specimens during the acute phase of infection. The lowest concentration of SARS-CoV-2 viral copies this assay can detect is 131 copies/mL. A negative result does not preclude SARS-Cov-2 infection and should not be used as the sole basis for treatment or other patient management decisions. A negative result may occur with  improper specimen collection/handling, submission of specimen other than nasopharyngeal swab, presence of viral mutation(s) within the areas targeted by this assay, and inadequate number of viral copies (<131 copies/mL). A negative result must be combined with clinical observations, patient history, and epidemiological information. The expected result is Negative. Fact Sheet for Patients:  PinkCheek.be Fact Sheet for Healthcare Providers:  GravelBags.it This test is not yet ap proved or cleared by the [place] FDA and   has been authorized for detection and/or diagnosis of SARS-CoV-2 by FDA under an Emergency Use Authorization (EUA). This EUA will remain  in effect (meaning this test can be used) for the duration of the COVID-19 declaration under Section 564(b)(1) of the Act, 21 U.S.C. section 360bbb-3(b)(1), unless the authorization is terminated or revoked sooner.    Influenza A by PCR NEGATIVE NEGATIVE Final   Influenza B by PCR NEGATIVE NEGATIVE Final    Comment: (NOTE) The Xpert Xpress SARS-CoV-2/FLU/RSV assay is intended as an aid in  the diagnosis of influenza from Nasopharyngeal swab specimens and  should not be used as a sole basis for treatment. Nasal washings and  aspirates are unacceptable for Xpert Xpress SARS-CoV-2/FLU/RSV  testing. Fact Sheet for Patients: PinkCheek.be Fact Sheet for Healthcare Providers: GravelBags.it This test is not yet approved or cleared by the [place] FDA and  has been authorized for detection and/or diagnosis of SARS-CoV-2 by  FDA under an Emergency Use Authorization (EUA). This EUA will remain  in effect (meaning this test can be used) for the duration of the  Covid-19 declaration  under Section 564(b)(1) of the Act, 21  U.S.C. section 360bbb-3(b)(1), unless the authorization is  terminated or revoked. Performed at [facility], [place] [address], [place], [place] [address]   MRSA PCR Screening     Status: None   Collection Time: 02/28/19 11:38 PM   Specimen: Nasal Mucosa; Nasopharyngeal  Result Value Ref Range Status   MRSA by PCR NEGATIVE NEGATIVE Final    Comment:        The GeneXpert MRSA Assay (FDA approved for NASAL specimens only), is one component of a comprehensive MRSA colonization surveillance program. It is not intended to diagnose MRSA infection nor to guide or monitor treatment for MRSA infections. Performed at [facility], [place] [address], [place], [place]  [address]      Studies: No results found.    Bonnell Public, MD  [facility] 03/03/2019

## 2019-03-03 NOTE — Plan of Care (Signed)
  Problem: Clinical Measurements: Goal: Respiratory complications will improve Outcome: Progressing   

## 2019-03-03 NOTE — TOC Progression Note (Signed)
Transition of Care [facility]) - Progression Note    Patient Details  Name: Dana Bush MRN: [id number] Date of Birth: [birth year]  Transition of Care [facility]) CM/SW [place], [place] Phone Number: [phone] 03/03/2019, 11:45 AM  Clinical Narrative:     CSW followed up with facility again to ascertain if patient could be admitted today and they could meet patient's needs. CSW spoke with [place] and she informed CSW that their ED had to approve all admissions over the weekend and she would follow back up with the CSW.  TOC team will continue to follow for discharge planning needs.        Expected Discharge Plan and Services                                                 Social Determinants of Health (SDOH) Interventions    Readmission Risk Interventions No flowsheet data found.

## 2019-03-04 LAB — CBC
HCT: 31.2 % — ABNORMAL LOW (ref 36.0–46.0)
Hemoglobin: 10.6 g/dL — ABNORMAL LOW (ref 12.0–15.0)
MCH: 31.4 pg (ref 26.0–34.0)
MCHC: 34 g/dL (ref 30.0–36.0)
MCV: 92.3 fL (ref 80.0–100.0)
Platelets: 322 10*3/uL (ref 150–400)
RBC: 3.38 MIL/uL — ABNORMAL LOW (ref 3.87–5.11)
RDW: 12.8 % (ref 11.5–15.5)
WBC: 7.2 10*3/uL (ref 4.0–10.5)
nRBC: 0 % (ref 0.0–0.2)

## 2019-03-04 MED ORDER — CITALOPRAM HYDROBROMIDE 40 MG PO TABS: 20.0000 mg | ORAL_TABLET | Freq: Every day | ORAL | 0 refills | Status: DC

## 2019-03-04 MED ORDER — APIXABAN 5 MG PO TABS: 5.0000 mg | ORAL_TABLET | Freq: Two times a day (BID) | ORAL | 1 refills | Status: AC

## 2019-03-04 MED ORDER — POTASSIUM CHLORIDE ER 10 MEQ PO TBCR: 20.0000 meq | EXTENDED_RELEASE_TABLET | Freq: Every day | ORAL | 0 refills | Status: AC

## 2019-03-04 MED ORDER — APIXABAN 5 MG PO TABS: 10.0000 mg | ORAL_TABLET | Freq: Two times a day (BID) | ORAL | 0 refills | Status: DC

## 2019-03-04 MED ORDER — ACETAMINOPHEN 325 MG PO TABS: 650.0000 mg | ORAL_TABLET | Freq: Four times a day (QID) | ORAL | 0 refills | Status: AC | PRN

## 2019-03-04 NOTE — TOC Transition Note (Signed)
Transition of Care [facility]) - CM/SW Discharge Note   Patient Details  Name: Dana Bush MRN: [id number] Date of Birth: 1926-04-25  Transition of Care [facility]) CM/SW Contact:  Alberteen Sam, LCSW Phone Number: 03/04/2019, 10:26 AM   Clinical Narrative:     Patient will DC to: [name] Anticipated DC date: 03/04/2019 Family notified:[name] Transport [id number]  Per MD patient ready for DC to [facility] . RN, patient, patient's family, and facility notified of DC. Discharge Summary sent to facility. RN given number for report  [phone] ask to speak with [facility]. DC packet on chart. Ambulance transport requested for patient.  CSW signing off.  [place], [place]   Final next level of care: Assisted Living Barriers to Discharge: No Barriers Identified   Patient Goals and CMS Choice   CMS Medicare.gov Compare Post Acute Care list provided to:: Patient Represenative (must comment)(son [name]) Choice offered to / list presented to : Adult Children  Discharge Placement              Patient chooses bed at: Other - please specify in the comment section below:([name]) Patient to be transferred to facility by: [place] Name of family member notified: [name] Patient and family notified of of transfer: 03/04/19  Discharge Plan and Services                          HH Arranged: PT [facility] Agency: [place] Date [place]: 03/04/19 Time HH Agency Contacted: 1000 Representative spoke with at [place]: [place] ([place]) Interventions     Readmission Risk Interventions No flowsheet data found.

## 2019-03-04 NOTE — Discharge Summary (Addendum)
Physician Discharge Summary  KYNNEDI DER [id number] DOB: 11-12-1926 DOA: 02/28/2019  PCP: Deland Pretty, MD  Admit date: 02/28/2019 Discharge date: 03/04/2019  Admitted From: [name] Disposition:  [name] with home health PT services   Recommendations for Outpatient Follow-up:  1. Follow up BMP in one week for potassium, creatinine. Adjust potassium supplement dose as needed.  2. Lasix was discontinued. Assess volume status and restart as deemed appropriate.  3. Megace as appetite stimulant was discontinued as some association with thromboembolic phenomenon is seen in case reports.  4. Duration of anticoagulation to be determined. Her mobility will likely remain poor and therefore she maybe at high risk of VTE recurrence.   Home Health: Yes HH PT/OT Equipment/Devices:none    Discharge Condition: Stable CODE STATUS: DNR Diet recommendation: regular   Brief/Interim Summary: 86 yrs old CF with PMH of advanced dementia, HTN, HLD, osteoporosis presented to hospital with nausea, vomiting and chest pain. Was found to pulmonary embolism with moderate clot burden on CTA chest. Bilateral LE doppler revealed acute deep vein thrombosis involving the right common femoral vein and right proximal profunda vein. VTE likely provoked as she is bedbound and possibly ?megace. Admission covid19, influenza test negative. She was placed on heparin drip and subsequently switched to eliquis. Echo showed no right ventricular heart strain. Mild AKI improved with gentle hydration. She remained hemodynamically stable throughout the hospital stay and was deemed stable for discharge to ALF with home health services.   Discharge Diagnoses:  Principal Problem:   Acute pulmonary embolism ([place]) Active Problems:   Dementia ([place])   ARF (acute renal failure) ([place])   Discharge Instructions:  Discharge Instructions    Diet - low sodium heart healthy   Complete by: As directed    Increase activity  slowly   Complete by: As directed      Allergies as of 03/04/2019   No Known Allergies     Medication List    STOP taking these medications   doxycycline 100 MG tablet Commonly known as: VIBRA-TABS   furosemide 20 MG tablet Commonly known as: LASIX   HYDROcodone-acetaminophen 5-325 MG tablet Commonly known as: NORCO/VICODIN   megestrol 400 MG/10ML suspension Commonly known as: MEGACE   POTASSIUM PHOSPHATE MONOBASIC PO     TAKE these medications   acetaminophen 325 MG tablet Commonly known as: TYLENOL Take 2 tablets (650 mg total) by mouth every 6 (six) hours as needed for mild pain, fever or headache (or Fever >/= 101).   albuterol 108 (90 Base) MCG/ACT inhaler Commonly known as: VENTOLIN HFA Inhale 2 puffs into the lungs every 6 (six) hours as needed for wheezing or shortness of breath.   alendronate 70 MG tablet Commonly known as: FOSAMAX Take 70 mg by mouth every Sunday. Take with a full glass of water on an empty stomach.   apixaban 5 MG Tabs tablet Commonly known as: ELIQUIS Take 2 tablets (10 mg total) by mouth 2 (two) times daily for 3 days.   apixaban 5 MG Tabs tablet Commonly known as: ELIQUIS Take 1 tablet (5 mg total) by mouth 2 (two) times daily. Start taking on: March 08, 2019   Calcium Carb-Cholecalciferol 600-400 MG-UNIT Tabs Take 1 tablet by mouth 2 (two) times daily.   citalopram 40 MG tablet Commonly known as: CELEXA Take 0.5 tablets (20 mg total) by mouth daily. What changed: how much to take   donepezil 10 MG tablet Commonly known as: ARICEPT Take 10 mg by mouth at bedtime.  famotidine 20 MG tablet Commonly known as: PEPCID Take 20 mg by mouth at bedtime.   ferrous sulfate 325 (65 FE) MG tablet Take 325 mg by mouth daily with breakfast.   LORazepam 0.5 MG tablet Commonly known as: ATIVAN Take 0.5 mg by mouth daily as needed.   mirtazapine 7.5 MG tablet Commonly known as: REMERON Take 7.5 mg by mouth at bedtime.    polyethylene glycol 17 g packet Commonly known as: MIRALAX / GLYCOLAX Take 17 g by mouth daily.   potassium chloride 10 MEQ tablet Commonly known as: KLOR-CON Take 2 tablets (20 mEq total) by mouth daily. What changed:   how much to take  when to take this   Spacer/Aero Chamber Mouthpiece Misc 1 Units by Does not apply route every 4 (four) hours as needed (wheezing).       No Known Allergies  Consultations:  None    Procedures/Studies: CT Angio Chest PE W and/or Wo Contrast  Result Date: 02/28/2019 CLINICAL DATA:  Pulmonary embolism on CT abdomen today EXAM: CT ANGIOGRAPHY CHEST WITH CONTRAST TECHNIQUE: Multidetector CT imaging of the chest was performed using the standard protocol during bolus administration of intravenous contrast. Multiplanar CT image reconstructions and MIPs were obtained to evaluate the vascular anatomy. CONTRAST:  50 mL Isovue 370 IV COMPARISON:  CT abdomen pelvis today. FINDINGS: Cardiovascular: Acute pulmonary embolism in the right main pulmonary artery extending into the right lower lobe pulmonary arteries. Relatively extensive thrombus is noted. Small amount of thrombus extending into the right middle lobe pulmonary artery. Right upper lobe clear for emboli. No emboli on the left. Heart size normal. RV LV ratio 0.96, upper normal. No pericardial effusion. Atherosclerotic aortic arch. No aortic aneurysm or dissection. Mediastinum/Nodes: Moderately large hiatal hernia. No mediastinal mass or adenopathy. Lungs/Pleura: Small right effusion. Right lower lobe airspace disease with elevated right hemidiaphragm. Left lung clear. Cystic change in the right upper lobe with surrounding soft tissue thickening, measuring approximately 3.5 cm in diameter. This is not present on the prior chest CT of 07/28/2011. Associated fissural thickening. Upper Abdomen: No acute abnormality. Musculoskeletal: Multiple thoracic fractures including T7, T8, T9, T10, T11, T12 of  indeterminate age. Review of the MIP images confirms the above findings. IMPRESSION: Acute pulmonary embolism primarily in the right lower lobe but also in the right middle lobe. No embolism on the left. Moderate clot volume. RV LV ratio upper normal 0.96 Patchy right lower lobe airspace disease possibly atelectasis, pneumonia, or pulmonary infarct. Small right effusion Cystic change right upper lobe with surrounding soft tissue thickening. This could be due to acute or chronic infection. Follow-up recommended. Aortic Atherosclerosis (ICD10-I70.0). Electronically Signed   By: Franchot Gallo M.D.   On: 02/28/2019 18:26   CT ABDOMEN PELVIS W CONTRAST  Result Date: 02/28/2019 CLINICAL DATA:  Abdominal pain and fever. EXAM: CT ABDOMEN AND PELVIS WITH CONTRAST TECHNIQUE: Multidetector CT imaging of the abdomen and pelvis was performed using the standard protocol following bolus administration of intravenous contrast. CONTRAST:  83mL OMNIPAQUE IOHEXOL 300 MG/ML  SOLN COMPARISON:  MRI lumbar spine 01/06/2011 FINDINGS: Lower chest: Small right pleural effusion with mild right lower lobe atelectasis. Right middle lobe atelectasis. Acute pulmonary emboli in the right lower lobe pulmonary artery. Incomplete evaluation for pulmonary embolism. Hepatobiliary: No focal liver abnormality is seen. No gallstones, gallbladder wall thickening, or biliary dilatation. Pancreas: Negative Spleen: Negative Adrenals/Urinary Tract: Adrenal glands are unremarkable. Kidneys are normal, without renal calculi, focal lesion, or hydronephrosis. Bladder is unremarkable. Stomach/Bowel: Moderately  large hiatal hernia. Negative for bowel obstruction. Negative for bowel mass or edema. Incomplete bowel rotation with the cecum in the left upper quadrant. Prominent ileocecal valve. Appendix nonvisualized. Sigmoid diverticulosis without diverticulitis. Vascular/Lymphatic: Atherosclerotic disease with diffuse aortic and iliac calcifications. No aneurysm.  No enlarged lymph nodes. Reproductive: Uterus and bilateral adnexa are unremarkable. Other: Small umbilical hernia.  No free fluid. Musculoskeletal: Multiple compression fractures including T7, T8, T9, T10, T12, L1, L3, L4. Previously noted MRI 2012 demonstrated fractures at T12 and L1. IMPRESSION: 1. Pulmonary embolism right lower lobe pulmonary artery. Incomplete evaluation of the pulmonary arteries. Small right pleural effusion and right lower lobe airspace disease. Possible pulmonary infarct. 2. No acute abnormality in the abdomen 3. Numerous compression fractures in the thoracic and lumbar spine of indeterminate age. 4. These results were called by telephone at the time of interpretation on 02/28/2019 at 4:01 pm to provider [facility] RAY , who verbally acknowledged these results. Electronically Signed   By: Franchot Gallo M.D.   On: 02/28/2019 16:01   DG Chest Port 1 View  Result Date: 02/28/2019 CLINICAL DATA:  Chest pain today. EXAM: PORTABLE CHEST 1 VIEW COMPARISON:  PA and lateral chest 04/13/2017. FINDINGS: The patient is rotated to the left. There is subsegmental atelectasis in the right mid and lower lung zones. Lungs otherwise clear. Heart size is normal. Atherosclerosis. No pneumothorax or pleural fluid. No acute or focal bony abnormality. IMPRESSION: No acute disease. Electronically Signed   By: Inge Rise M.D.   On: 02/28/2019 12:35   ECHOCARDIOGRAM COMPLETE  Result Date: 03/01/2019   ECHOCARDIOGRAM REPORT   Patient Name:   KIRSTINE WISLER Date of Exam: 03/01/2019 Medical Rec #:  [id number]         Height:       62.0 in Accession #:    [id number]        Weight:       123.0 lb Date of Birth:  10/20/1926         BSA:          1.55 m Patient Age:    89 years          BP:           127/63 mmHg Patient Gender: F                 HR:           70 bpm. Exam Location:  Inpatient Procedure: 2D Echo, Color Doppler and Cardiac Doppler Indications:    I26.02 Pulmonary embolus  History:        Patient has  prior history of Echocardiogram examinations, most                 recent 06/18/2016. Signs/Symptoms:Dementia; Risk                 Factors:Dyslipidemia.  Sonographer:    Raquel Sarna Senior RDCS Referring Phys: [place]  Sonographer Comments: Technically difficult study. Patient confused and uncooperative, laying on right side. IMPRESSIONS  1. Very technically difficult study with limited images as patient refused to cooperate. Recommend repeating TTE once patient able to cooperate.  2. Left ventricle is poorly visualized. Grossly normal global systolic function, unable to assess for regional wall motion abnormalities  3. Right ventricle is poorly visualized. Function appears mildly reduced and there is the suggestion of free wall hypokinesis with preserved apical function (McConnell's sign, as can be seen in acute PE), though free wall is not  well-visualized. The right ventricular size is normal.  4. The aortic valve was not well visualized. Aortic valve regurgitation is not visualized. No evidence of aortic valve stenosis.  5. The mitral valve was not well visualized. No evidence of mitral valve regurgitation.  6. The tricuspid valve is not well visualized. Tricuspid valve regurgitation is trivial FINDINGS  Left Ventricle: Left ventricular ejection fraction, by visual estimation, is 60 to 65%. The left ventricle has normal function. The left ventricle is not well visualized. There is mildly increased left ventricular hypertrophy. Right Ventricle: The right ventricular size is normal. Right vetricular wall thickness was not assessed. Global RV systolic function is has mildly reduced systolic function. The tricuspid regurgitant velocity is 2.05 m/s, and with an assumed right atrial  pressure of 8 mmHg, the estimated right ventricular systolic pressure is normal at 24.8 mmHg. Left Atrium: Left atrial size was normal in size. Right Atrium: Right atrial size was normal in size Pericardium: Trivial pericardial  effusion is present. Mitral Valve: The mitral valve was not well visualized. No evidence of mitral valve regurgitation. Tricuspid Valve: The tricuspid valve is not well visualized. Tricuspid valve regurgitation is trivial. Aortic Valve: The aortic valve was not well visualized. Aortic valve regurgitation is not visualized. The aortic valve is structurally normal, with no evidence of sclerosis or stenosis. Pulmonic Valve: The pulmonic valve was not well visualized. Pulmonic valve regurgitation is not visualized. Pulmonic regurgitation is not visualized. Aorta: The aortic root was not well visualized. IAS/Shunts: The interatrial septum was not well visualized.  LEFT VENTRICLE PLAX 2D LVOT diam:     2.00 cm LVOT Area:     3.14 cm  LEFT ATRIUM           Index       RIGHT ATRIUM           Index LA Vol (A4C): 41.0 ml 26.37 ml/m RA Area:     17.30 cm                                   RA Volume:   42.20 ml  27.14 ml/m  AORTIC VALVE LVOT Vmax:   68.00 cm/s LVOT Vmean:  45.900 cm/s LVOT VTI:    0.151 m MITRAL VALVE                        TRICUSPID VALVE MV Area (PHT): 2.32 cm             TR Peak grad:   16.8 mmHg MV PHT:        94.83 msec           TR Vmax:        205.00 cm/s MV Decel Time: 327 msec MV E velocity: 52.10 cm/s 103 cm/s  SHUNTS MV A velocity: 85.90 cm/s 70.3 cm/s Systemic VTI:  0.15 m MV E/A ratio:  0.61       1.5       Systemic Diam: 2.00 cm  Oswaldo Milian MD Electronically signed by Oswaldo Milian MD Signature Date/Time: 03/01/2019/12:10:01 PM    Final    VAS [place] LOWER EXTREMITY VENOUS (DVT)  Result Date: 03/03/2019  Lower Venous DVTStudy Indications: Pulmonary embolism.  Comparison Study: no prior Performing Technologist: Abram Sander RVS  Examination Guidelines: A complete evaluation includes B-mode imaging, spectral Doppler, color Doppler, and power Doppler as needed of all accessible portions of each  vessel. Bilateral testing is considered an integral part of a complete examination.  Limited examinations for reoccurring indications may be performed as noted. The reflux portion of the exam is performed with the patient in reverse Trendelenburg.  +---------+---------------+---------+-----------+----------+-----------------+ RIGHT    CompressibilityPhasicitySpontaneityPropertiesThrombus Aging    +---------+---------------+---------+-----------+----------+-----------------+ CFV      None           Yes      Yes                  Age Indeterminate +---------+---------------+---------+-----------+----------+-----------------+ SFJ      Full                                                           +---------+---------------+---------+-----------+----------+-----------------+ FV Prox  Full                                                           +---------+---------------+---------+-----------+----------+-----------------+ FV Mid   Full                                                           +---------+---------------+---------+-----------+----------+-----------------+ FV DistalFull                                                           +---------+---------------+---------+-----------+----------+-----------------+ PFV      None                                                           +---------+---------------+---------+-----------+----------+-----------------+ POP      Full           Yes      Yes                                    +---------+---------------+---------+-----------+----------+-----------------+ PTV      Full                                                           +---------+---------------+---------+-----------+----------+-----------------+ PERO                                                  Not visualized    +---------+---------------+---------+-----------+----------+-----------------+ EIV  Yes      Yes                                     +---------+---------------+---------+-----------+----------+-----------------+   +---------+---------------+---------+-----------+----------+--------------+ LEFT     CompressibilityPhasicitySpontaneityPropertiesThrombus Aging +---------+---------------+---------+-----------+----------+--------------+ CFV      Full           Yes      Yes                                 +---------+---------------+---------+-----------+----------+--------------+ SFJ      Full                                                        +---------+---------------+---------+-----------+----------+--------------+ FV Prox  Full                                                        +---------+---------------+---------+-----------+----------+--------------+ FV Mid   Full                                                        +---------+---------------+---------+-----------+----------+--------------+ FV DistalFull                                                        +---------+---------------+---------+-----------+----------+--------------+ PFV      Full                                                        +---------+---------------+---------+-----------+----------+--------------+ POP      Full           Yes      Yes                                 +---------+---------------+---------+-----------+----------+--------------+ PTV      Full                                                        +---------+---------------+---------+-----------+----------+--------------+ PERO     Full                                                        +---------+---------------+---------+-----------+----------+--------------+  Summary: RIGHT: - Findings consistent with acute deep vein thrombosis involving the right common femoral vein, and right proximal profunda vein. - No cystic structure found in the popliteal fossa. - Obtained dopplers in the EIV.  LEFT: - There is no evidence of deep  vein thrombosis in the lower extremity.  - No cystic structure found in the popliteal fossa.  *See table(s) above for measurements and observations. Electronically signed by Ruta Hinds MD on 03/03/2019 at 11:33:37 AM.    Final      Subjective: Doing fine. Oriented to self. No complains.   Discharge Exam: Vitals:   03/03/19 2125 03/04/19 0529  BP: 119/72 128/77  Pulse: 80 63  Resp: 16 18  Temp: 98.4 F (36.9 C) 98.9 F (37.2 C)  SpO2: 95% 96%   Vitals:   03/03/19 0414 03/03/19 1226 03/03/19 2125 03/04/19 0529  BP: 119/74 92/76 119/72 128/77  Pulse: 79 64 80 63  Resp: 16  16 18   Temp: 98.7 F (37.1 C) 98 F (36.7 C) 98.4 F (36.9 C) 98.9 F (37.2 C)  TempSrc: Oral  Oral   SpO2: 92% 94% 95% 96%  Weight: 56.1 kg   56.2 kg  Height:        GENERAL: Patient is awake and alert.  Patient is not in any obvious distress.  HEENT: No scleral pallor or icterus. Pupils equally reactive to light. Oral mucosa is moist NECK: Supple.  CHEST: Clear to auscultation.  CVS: S1 and S2 heard.  ABDOMEN: Soft, non-tender, bowel sounds are present. Neuro: knows her name, not DOB. Not oriented to place or time EXTREMITIES: No edema.   The results of significant diagnostics from this hospitalization (including imaging, microbiology, ancillary and laboratory) are listed below for reference.     Microbiology: Recent Results (from the past 240 hour(s))  Respiratory Panel by RT PCR (Flu A&B, Covid) - Nasopharyngeal Swab     Status: None   Collection Time: 02/28/19  8:01 PM   Specimen: Nasopharyngeal Swab  Result Value Ref Range Status   SARS Coronavirus 2 by RT PCR NEGATIVE NEGATIVE Final    Comment: (NOTE) SARS-CoV-2 target nucleic acids are NOT DETECTED. The SARS-CoV-2 RNA is generally detectable in upper respiratoy specimens during the acute phase of infection. The lowest concentration of SARS-CoV-2 viral copies this assay can detect is 131 copies/mL. A negative result does not preclude  SARS-Cov-2 infection and should not be used as the sole basis for treatment or other patient management decisions. A negative result may occur with  improper specimen collection/handling, submission of specimen other than nasopharyngeal swab, presence of viral mutation(s) within the areas targeted by this assay, and inadequate number of viral copies (<131 copies/mL). A negative result must be combined with clinical observations, patient history, and epidemiological information. The expected result is Negative. Fact Sheet for Patients:  PinkCheek.be Fact Sheet for Healthcare Providers:  GravelBags.it This test is not yet ap proved or cleared by the [place] FDA and  has been authorized for detection and/or diagnosis of SARS-CoV-2 by FDA under an Emergency Use Authorization (EUA). This EUA will remain  in effect (meaning this test can be used) for the duration of the COVID-19 declaration under Section 564(b)(1) of the Act, 21 U.S.C. section 360bbb-3(b)(1), unless the authorization is terminated or revoked sooner.    Influenza A by PCR NEGATIVE NEGATIVE Final   Influenza B by PCR NEGATIVE NEGATIVE Final    Comment: (NOTE) The Xpert Xpress SARS-CoV-2/FLU/RSV assay is intended as an aid  in  the diagnosis of influenza from Nasopharyngeal swab specimens and  should not be used as a sole basis for treatment. Nasal washings and  aspirates are unacceptable for Xpert Xpress SARS-CoV-2/FLU/RSV  testing. Fact Sheet for Patients: PinkCheek.be Fact Sheet for Healthcare Providers: GravelBags.it This test is not yet approved or cleared by the [place] FDA and  has been authorized for detection and/or diagnosis of SARS-CoV-2 by  FDA under an Emergency Use Authorization (EUA). This EUA will remain  in effect (meaning this test can be used) for the duration of the  Covid-19  declaration under Section 564(b)(1) of the Act, 21  U.S.C. section 360bbb-3(b)(1), unless the authorization is  terminated or revoked. Performed at [facility], [place] [address], [place], [place] [address]   MRSA PCR Screening     Status: None   Collection Time: 02/28/19 11:38 PM   Specimen: Nasal Mucosa; Nasopharyngeal  Result Value Ref Range Status   MRSA by PCR NEGATIVE NEGATIVE Final    Comment:        The GeneXpert MRSA Assay (FDA approved for NASAL specimens only), is one component of a comprehensive MRSA colonization surveillance program. It is not intended to diagnose MRSA infection nor to guide or monitor treatment for MRSA infections. Performed at [facility], [place] [address], [place], [place] [address]      Labs: BNP (last 3 results) No results for input(s): BNP in the last 8760 hours. Basic Metabolic Panel: Recent Labs  Lab 02/28/19 1148 03/01/19 0503  NA 139 140  K 4.5 3.9  CL 108 111  CO2 18* 19*  GLUCOSE 173* 116*  BUN 16 15  CREATININE 1.15* 1.10*  CALCIUM 9.8 9.2   Liver Function Tests: Recent Labs  Lab 02/28/19 1148  AST 26  ALT 23  ALKPHOS 53  BILITOT 1.0  PROT 7.4  ALBUMIN 3.2*   Recent Labs  Lab 02/28/19 1148  LIPASE 34   No results for input(s): AMMONIA in the last 168 hours. CBC: Recent Labs  Lab 02/28/19 1148 03/01/19 0503 03/02/19 0410 03/03/19 0454 03/04/19 0359  WBC 16.1* 15.1* 9.6 6.6 7.2  HGB 11.9* 10.3* 9.2* 9.8* 10.6*  HCT 37.4 31.7* 28.6* 29.9* 31.2*  MCV 97.7 94.9 96.0 94.3 92.3  PLT 234 208 255 289 322   Cardiac Enzymes: No results for input(s): CKTOTAL, CKMB, CKMBINDEX, TROPONINI in the last 168 hours. BNP: Invalid input(s): POCBNP CBG: Recent Labs  Lab 02/28/19 1249 03/01/19 1106  GLUCAP 127* 114*   D-Dimer No results for input(s): DDIMER in the last 72 hours. Hgb A1c No results for input(s): HGBA1C in the last 72 hours. Lipid Profile No results for input(s): CHOL, HDL, LDLCALC,  TRIG, CHOLHDL, LDLDIRECT in the last 72 hours. Thyroid function studies No results for input(s): TSH, T4TOTAL, T3FREE, THYROIDAB in the last 72 hours.  Invalid input(s): FREET3 Anemia work up No results for input(s): VITAMINB12, FOLATE, FERRITIN, TIBC, IRON, RETICCTPCT in the last 72 hours. Urinalysis    Component Value Date/Time   COLORURINE YELLOW 02/28/2019 1150   APPEARANCEUR CLEAR 02/28/2019 1150   LABSPEC 1.010 02/28/2019 1150   PHURINE 6.0 02/28/2019 1150   GLUCOSEU NEGATIVE 02/28/2019 1150   HGBUR MODERATE (A) 02/28/2019 1150   BILIRUBINUR NEGATIVE 02/28/2019 1150   KETONESUR NEGATIVE 02/28/2019 1150   PROTEINUR NEGATIVE 02/28/2019 1150   UROBILINOGEN 0.2 08/10/2011 1650   NITRITE NEGATIVE 02/28/2019 1150   LEUKOCYTESUR NEGATIVE 02/28/2019 1150   Sepsis Labs Invalid input(s): PROCALCITONIN,  WBC,  LACTICIDVEN Microbiology Recent Results (from the past 240 hour(s))  Respiratory Panel by RT PCR (Flu A&B, Covid) - Nasopharyngeal Swab     Status: None   Collection Time: 02/28/19  8:01 PM   Specimen: Nasopharyngeal Swab  Result Value Ref Range Status   SARS Coronavirus 2 by RT PCR NEGATIVE NEGATIVE Final    Comment: (NOTE) SARS-CoV-2 target nucleic acids are NOT DETECTED. The SARS-CoV-2 RNA is generally detectable in upper respiratoy specimens during the acute phase of infection. The lowest concentration of SARS-CoV-2 viral copies this assay can detect is 131 copies/mL. A negative result does not preclude SARS-Cov-2 infection and should not be used as the sole basis for treatment or other patient management decisions. A negative result may occur with  improper specimen collection/handling, submission of specimen other than nasopharyngeal swab, presence of viral mutation(s) within the areas targeted by this assay, and inadequate number of viral copies (<131 copies/mL). A negative result must be combined with clinical observations, patient history, and epidemiological  information. The expected result is Negative. Fact Sheet for Patients:  PinkCheek.be Fact Sheet for Healthcare Providers:  GravelBags.it This test is not yet ap proved or cleared by the [place] FDA and  has been authorized for detection and/or diagnosis of SARS-CoV-2 by FDA under an Emergency Use Authorization (EUA). This EUA will remain  in effect (meaning this test can be used) for the duration of the COVID-19 declaration under Section 564(b)(1) of the Act, 21 U.S.C. section 360bbb-3(b)(1), unless the authorization is terminated or revoked sooner.    Influenza A by PCR NEGATIVE NEGATIVE Final   Influenza B by PCR NEGATIVE NEGATIVE Final    Comment: (NOTE) The Xpert Xpress SARS-CoV-2/FLU/RSV assay is intended as an aid in  the diagnosis of influenza from Nasopharyngeal swab specimens and  should not be used as a sole basis for treatment. Nasal washings and  aspirates are unacceptable for Xpert Xpress SARS-CoV-2/FLU/RSV  testing. Fact Sheet for Patients: PinkCheek.be Fact Sheet for Healthcare Providers: GravelBags.it This test is not yet approved or cleared by the [place] FDA and  has been authorized for detection and/or diagnosis of SARS-CoV-2 by  FDA under an Emergency Use Authorization (EUA). This EUA will remain  in effect (meaning this test can be used) for the duration of the  Covid-19 declaration under Section 564(b)(1) of the Act, 21  U.S.C. section 360bbb-3(b)(1), unless the authorization is  terminated or revoked. Performed at [facility], [place] [address] [place], [place] [address]   MRSA PCR Screening     Status: None   Collection Time: 02/28/19 11:38 PM   Specimen: Nasal Mucosa; Nasopharyngeal  Result Value Ref Range Status   MRSA by PCR NEGATIVE NEGATIVE Final    Comment:        The GeneXpert MRSA Assay (FDA approved for NASAL  specimens only), is one component of a comprehensive MRSA colonization surveillance program. It is not intended to diagnose MRSA infection nor to guide or monitor treatment for MRSA infections. Performed at [facility], [place] [address], [place], [place] [address]      Time coordinating discharge: Over 30 minutes  SIGNED:   Lucky Cowboy, MD  [facility] 03/04/2019, 9:52 AM  If 7PM-7AM, please contact night-coverage

## 2019-03-04 NOTE — TOC Initial Note (Signed)
Transition of Care [facility]) - Initial/Assessment Note    Patient Details  Name: Dana Bush MRN: [id number] Date of Birth: 03/04/1926  Transition of Care [facility]) CM/SW Contact:    Alberteen Sam, [place] Phone Number: [phone] 03/04/2019, 9:47 AM  Clinical Narrative:                  CSW spoke with patient's son [name], confirmed dc plan is to return to [facility] with home health PT. CSW spoke with [facility] admissions they report they are able to accept patient back today pending dc summary and orders as well as fl2. All documents to be faxed to [phone] and report called to [phone] ask for [facility]   CSW spoke with Ronalee Belts at [facility], reports they are able to accept patient for home health PT. CSW informed MD of needed hh PT orders.   Expected Discharge Plan: Assisted Living Barriers to Discharge: No Barriers Identified   Patient Goals and CMS Choice   CMS Medicare.gov Compare Post Acute Care list provided to:: Patient Represenative (must comment) Choice offered to / list presented to : Adult Children  Expected Discharge Plan and Services Expected Discharge Plan: Assisted Living       Living arrangements for the past 2 months: Assisted Living Facility([name])                           HH Arranged: PT HH Agency: [facility] Date [place]: 03/04/19 Time [place]: [phone] Representative spoke with at [place]: [place] Arrangements/Services Living arrangements for the past 2 months: Assisted Living Facility([name]) Lives with:: Self Patient language and need for interpreter reviewed:: Yes Do you feel safe going back to the place where you live?: Yes        Care giver support system in place?: Yes (comment)   Criminal Activity/Legal Involvement Pertinent to Current Situation/Hospitalization: No - Comment as needed  Activities of Daily Living      Permission Sought/Granted Permission sought to  share information with : Case Manager, Customer service manager, Family Supports Permission granted to share information with : Yes, Verbal Permission Granted  Share Information with NAME: [name]  Permission granted to share info w AGENCY: ALF  Permission granted to share info w Relationship: son  Permission granted to share info w Contact Information: [phone]  Emotional Assessment Appearance:: Appears stated age Attitude/Demeanor/Rapport: Unable to Assess Affect (typically observed): Unable to Assess Orientation: : Oriented to Self Alcohol / Substance Use: Not Applicable Psych Involvement: No (comment)  Admission diagnosis:  Epigastric pain [R10.13] Pulmonary embolus, right (HCC) [I26.99] Acute pulmonary embolism ([place]) [I26.99] Patient Active Problem List   Diagnosis Date Noted  . Acute pulmonary embolism ([place]) 02/28/2019  . ARF (acute renal failure) ([place]) 02/28/2019  . Influenza A 04/14/2017  . Dementia ([place]) 04/14/2017  . Hyperglycemia 04/14/2017  . Malnutrition of moderate degree 04/14/2017  . Sepsis due to pneumonia ([place]) 04/13/2017  . Chest pain 06/17/2016  . Left leg swelling 06/17/2016  . Drug overdose, accidental or unintentional, initial encounter 06/17/2016  . Malnutrition of mild degree ([place]) 01/09/2011  . Dizziness 01/09/2011  . Compression fracture of L1 lumbar vertebra ([place]) 01/05/2011  . Fall 01/05/2011  . Hyponatremia 01/05/2011  . Osteoporosis 01/05/2011   PCP:  Deland Pretty, MD Pharmacy:  No Pharmacies Listed    Social Determinants of Health (SDOH) Interventions    Readmission Risk Interventions No flowsheet data found.

## 2019-03-04 NOTE — NC FL2 (Signed)
[place] LEVEL OF CARE SCREENING TOOL     IDENTIFICATION  Patient Name: Dana Bush Birthdate: Dec 18, 1926 Sex: female Admission Date (Current Location): 02/28/2019  [facility] and [place] Number:  Herbalist and Address:  [facility] [place], [place] [address] [place], [place] [address]      Provider Number: [id number]  Attending Physician Name and Address:  Lucky Cowboy, MD  Relative Name and Phone Number:  son [name] [phone]    Current Level of Care: Other (Comment)([name]) Recommended Level of Care: Assisted Living Facility Prior Approval Number:    Date Approved/Denied:   PASRR Number:    Discharge Plan: Other (Comment)([name])    Current Diagnoses: Patient Active Problem List   Diagnosis Date Noted  . Acute pulmonary embolism ([place]) 02/28/2019  . ARF (acute renal failure) ([place]) 02/28/2019  . Influenza A 04/14/2017  . Dementia ([place]) 04/14/2017  . Hyperglycemia 04/14/2017  . Malnutrition of moderate degree 04/14/2017  . Sepsis due to pneumonia ([place]) 04/13/2017  . Chest pain 06/17/2016  . Left leg swelling 06/17/2016  . Drug overdose, accidental or unintentional, initial encounter 06/17/2016  . Malnutrition of mild degree ([place]) 01/09/2011  . Dizziness 01/09/2011  . Compression fracture of L1 lumbar vertebra ([place]) 01/05/2011  . Fall 01/05/2011  . Hyponatremia 01/05/2011  . Osteoporosis 01/05/2011    Orientation RESPIRATION BLADDER Height & Weight     Self  Normal Incontinent, External catheter Weight: 123 lb 14.4 oz (56.2 kg) Height:  5\' 2"  (157.5 cm)  BEHAVIORAL SYMPTOMS/MOOD NEUROLOGICAL BOWEL NUTRITION STATUS      Incontinent Diet(Regular)  AMBULATORY STATUS COMMUNICATION OF NEEDS Skin   Extensive Assist Verbally Other (Comment)(MASD groin)                       Personal Care Assistance Level of Assistance  Bathing, Feeding, Dressing Bathing Assistance: Limited assistance Feeding  assistance: Limited assistance Dressing Assistance: Limited assistance     Functional Limitations Info  Hearing, Speech Sight Info: Adequate Hearing Info: Adequate Speech Info: Adequate    SPECIAL CARE FACTORS FREQUENCY  PT (By licensed PT)(set up through [facility])     PT Frequency: min 3x weekly              Contractures Contractures Info: Not present    Additional Factors Info  Code Status, Allergies Code Status Info: DNR Allergies Info: No Known Allergies             Discharge Medications: TAKE these medications   acetaminophen 325 MG tablet Commonly known as: TYLENOL Take 2 tablets (650 mg total) by mouth every 6 (six) hours as needed for mild pain, fever or headache (or Fever >/= 101).   albuterol 108 (90 Base) MCG/ACT inhaler Commonly known as: VENTOLIN HFA Inhale 2 puffs into the lungs every 6 (six) hours as needed for wheezing or shortness of breath.   alendronate 70 MG tablet Commonly known as: FOSAMAX Take 70 mg by mouth every Sunday. Take with a full glass of water on an empty stomach.   apixaban 5 MG Tabs tablet Commonly known as: ELIQUIS Take 2 tablets (10 mg total) by mouth 2 (two) times daily for 3 days.   apixaban 5 MG Tabs tablet Commonly known as: ELIQUIS Take 1 tablet (5 mg total) by mouth 2 (two) times daily. Start taking on: March 08, 2019   Calcium Carb-Cholecalciferol 600-400 MG-UNIT Tabs Take 1 tablet by  mouth 2 (two) times daily.   citalopram 40 MG tablet Commonly known as: CELEXA Take 0.5 tablets (20 mg total) by mouth daily. What changed: how much to take   donepezil 10 MG tablet Commonly known as: ARICEPT Take 10 mg by mouth at bedtime.   famotidine 20 MG tablet Commonly known as: PEPCID Take 20 mg by mouth at bedtime.   ferrous sulfate 325 (65 FE) MG tablet Take 325 mg by mouth daily with breakfast.   LORazepam 0.5 MG tablet Commonly known as: ATIVAN Take 0.5 mg by mouth daily as  needed.   mirtazapine 7.5 MG tablet Commonly known as: REMERON Take 7.5 mg by mouth at bedtime.   polyethylene glycol 17 g packet Commonly known as: MIRALAX / GLYCOLAX Take 17 g by mouth daily.   potassium chloride 10 MEQ tablet Commonly known as: KLOR-CON Take 2 tablets (20 mEq total) by mouth daily. What changed:   how much to take  when to take this   Spacer/Aero Chamber Mouthpiece Misc 1 Units by Does not apply route every 4 (four) hours as needed (wheezing).        Relevant Imaging Results:  Relevant Lab Results:   Additional Information SSN:[id number]  Alberteen Sam, LCSW

## 2019-03-04 NOTE — Progress Notes (Addendum)
Report called and given to Aura Camps, LPN at [facility]. Transporters at bedside and patient in route to facility. Patient discharged with belongings and paperwork.

## 2019-05-19 ENCOUNTER — Encounter (HOSPITAL_COMMUNITY): Payer: Self-pay

## 2019-05-19 ENCOUNTER — Emergency Department (HOSPITAL_COMMUNITY): Payer: Medicare Other

## 2019-05-19 ENCOUNTER — Other Ambulatory Visit: Payer: Self-pay

## 2019-05-19 ENCOUNTER — Emergency Department (HOSPITAL_COMMUNITY)
Admission: EM | Admit: 2019-05-19 | Discharge: 2019-05-19 | Disposition: A | Discharge status: Skilled Nursing Facility | Payer: Medicare Other | Attending: Emergency Medicine | Admitting: Emergency Medicine

## 2019-05-19 DIAGNOSIS — R0602 Shortness of breath: Secondary | ICD-10-CM | POA: Insufficient documentation

## 2019-05-19 DIAGNOSIS — Z7901 Long term (current) use of anticoagulants: Secondary | ICD-10-CM | POA: Diagnosis not present

## 2019-05-19 DIAGNOSIS — Z79899 Other long term (current) drug therapy: Secondary | ICD-10-CM | POA: Diagnosis not present

## 2019-05-19 DIAGNOSIS — F0390 Unspecified dementia without behavioral disturbance: Secondary | ICD-10-CM | POA: Insufficient documentation

## 2019-05-19 DIAGNOSIS — Z87891 Personal history of nicotine dependence: Secondary | ICD-10-CM | POA: Insufficient documentation

## 2019-05-19 DIAGNOSIS — R531 Weakness: Secondary | ICD-10-CM | POA: Diagnosis not present

## 2019-05-19 DIAGNOSIS — R5383 Other fatigue: Secondary | ICD-10-CM | POA: Diagnosis present

## 2019-05-19 DIAGNOSIS — E876 Hypokalemia: Secondary | ICD-10-CM | POA: Diagnosis not present

## 2019-05-19 DIAGNOSIS — R001 Bradycardia, unspecified: Secondary | ICD-10-CM | POA: Diagnosis not present

## 2019-05-19 LAB — CBC WITH DIFFERENTIAL/PLATELET
Abs Immature Granulocytes: 0.01 K/uL (ref 0.00–0.07)
Basophils Absolute: 0 K/uL (ref 0.0–0.1)
Basophils Relative: 1 %
Eosinophils Absolute: 0.1 K/uL (ref 0.0–0.5)
Eosinophils Relative: 3 %
HCT: 36.3 % (ref 36.0–46.0)
Hemoglobin: 11.8 g/dL — ABNORMAL LOW (ref 12.0–15.0)
Immature Granulocytes: 0 %
Lymphocytes Relative: 31 %
Lymphs Abs: 1.4 K/uL (ref 0.7–4.0)
MCH: 31.1 pg (ref 26.0–34.0)
MCHC: 32.5 g/dL (ref 30.0–36.0)
MCV: 95.5 fL (ref 80.0–100.0)
Monocytes Absolute: 0.5 K/uL (ref 0.1–1.0)
Monocytes Relative: 12 %
Neutro Abs: 2.4 K/uL (ref 1.7–7.7)
Neutrophils Relative %: 53 %
Platelets: 233 K/uL (ref 150–400)
RBC: 3.8 MIL/uL — ABNORMAL LOW (ref 3.87–5.11)
RDW: 14.5 % (ref 11.5–15.5)
WBC: 4.5 K/uL (ref 4.0–10.5)
nRBC: 0 % (ref 0.0–0.2)

## 2019-05-19 LAB — URINALYSIS, ROUTINE W REFLEX MICROSCOPIC
Bilirubin Urine: NEGATIVE
Glucose, UA: NEGATIVE mg/dL
Hgb urine dipstick: NEGATIVE
Ketones, ur: NEGATIVE mg/dL
Leukocytes,Ua: NEGATIVE
Nitrite: NEGATIVE
Protein, ur: NEGATIVE mg/dL
Specific Gravity, Urine: 1.01 (ref 1.005–1.030)
pH: 7.5 (ref 5.0–8.0)

## 2019-05-19 LAB — COMPREHENSIVE METABOLIC PANEL
ALT: 7 U/L (ref 0–44)
AST: 24 U/L (ref 15–41)
Albumin: 3.4 g/dL — ABNORMAL LOW (ref 3.5–5.0)
Alkaline Phosphatase: 31 U/L — ABNORMAL LOW (ref 38–126)
Anion gap: 9 (ref 5–15)
BUN: 11 mg/dL (ref 8–23)
CO2: 31 mmol/L (ref 22–32)
Calcium: 10 mg/dL (ref 8.9–10.3)
Chloride: 102 mmol/L (ref 98–111)
Creatinine, Ser: 1.16 mg/dL — ABNORMAL HIGH (ref 0.44–1.00)
GFR calc Af Amer: 48 mL/min — ABNORMAL LOW (ref >60)
GFR calc non Af Amer: 41 mL/min — ABNORMAL LOW (ref >60)
Glucose, Bld: 88 mg/dL (ref 70–99)
Potassium: 2.8 mmol/L — ABNORMAL LOW (ref 3.5–5.1)
Sodium: 142 mmol/L (ref 135–145)
Total Bilirubin: 0.9 mg/dL (ref 0.3–1.2)
Total Protein: 6.8 g/dL (ref 6.5–8.1)

## 2019-05-19 LAB — TSH: TSH: 0.597 u[IU]/mL (ref 0.350–4.500)

## 2019-05-19 MED ORDER — SODIUM CHLORIDE (PF) 0.9 % IJ SOLN
INTRAMUSCULAR | Status: AC
  Filled 2019-05-19: qty 50

## 2019-05-19 MED ORDER — IOHEXOL 350 MG/ML SOLN
75.0000 mL | Freq: Once | INTRAVENOUS | Status: AC | PRN
  Administered 2019-05-19: 75 mL via INTRAVENOUS

## 2019-05-19 MED ORDER — POTASSIUM CHLORIDE 10 MEQ/100ML IV SOLN
10.0000 meq | Freq: Once | INTRAVENOUS | Status: AC
  Administered 2019-05-19: 10 meq via INTRAVENOUS
  Filled 2019-05-19: qty 100

## 2019-05-19 MED ORDER — POTASSIUM CHLORIDE CRYS ER 20 MEQ PO TBCR: 20.0000 meq | EXTENDED_RELEASE_TABLET | Freq: Two times a day (BID) | ORAL | 0 refills | Status: DC

## 2019-05-19 NOTE — ED Triage Notes (Addendum)
Pt BIB EMS from [facility] for lethargy. Per EMS, the facility sts when they went in her room to wake her for the day, she was not responding as usual. Pt has a hx of dementia, AAOx1, but she did not seem appropriate to them. Facility also sts she was just treated for a UTI with cipro, in which she finished on 05/14/19. Facility denies any injuries or falls. Pt is a DNR, and the yellow form is at the bedside.  BP-150/70 HR- 60 RR- 14 O2- 96% r/a CBG- 99 T- 97.9- oral

## 2019-05-19 NOTE — ED Notes (Signed)
[facility] called for transport back to facility 

## 2019-05-19 NOTE — Discharge Instructions (Signed)
The potassium was mildly low.  Otherwise work-up was reassuring.  Follow-up with your doctor.  She had a mild bradycardia that has improved.

## 2019-05-19 NOTE — ED Notes (Signed)
A urine culture was sent to the lab. Nurse aware.

## 2019-05-19 NOTE — ED Provider Notes (Addendum)
[place] DEPT Provider Note   CSN: [id number] Arrival date & time: 05/19/19  [id number]     History Chief Complaint  Patient presents with  . Fatigue    Dana Bush is a 86 y.o. female.  HPI Level 5 caveat due to dementia. Reportedly sent in from [facility] living for mental status change.  Reportedly went to see her for days she was not responsive as normal.  History of dementia.  Reportedly just finished up treatment for UTI 5 days ago.  Reportedly was on Cipro.  Denies falls.  Patient really cannot provide much history.  Baseline is reportedly alert and oriented only to self.  Reviewing records had acute pulmonary embolism 20th months ago and started on anticoagulation at that time.    Past Medical History:  Diagnosis Date  . Anxiety   . Compression fracture of L2 lumbar vertebra (HCC)   . Dementia ([place])   . Depression   . Hyperlipemia   . IgG monoclonal gammopathy   . Osteoporosis   . Peptic ulcer disease     Patient Active Problem List   Diagnosis Date Noted  . Acute pulmonary embolism ([place]) 02/28/2019  . ARF (acute renal failure) ([place]) 02/28/2019  . Influenza A 04/14/2017  . Dementia ([place]) 04/14/2017  . Hyperglycemia 04/14/2017  . Malnutrition of moderate degree 04/14/2017  . Sepsis due to pneumonia ([place]) 04/13/2017  . Chest pain 06/17/2016  . Left leg swelling 06/17/2016  . Drug overdose, accidental or unintentional, initial encounter 06/17/2016  . Malnutrition of mild degree ([place]) 01/09/2011  . Dizziness 01/09/2011  . Compression fracture of L1 lumbar vertebra ([place]) 01/05/2011  . Fall 01/05/2011  . Hyponatremia 01/05/2011  . Osteoporosis 01/05/2011    Past Surgical History:  Procedure Laterality Date  . cervical polyp removal    . DILATION AND CURETTAGE OF UTERUS    . TONSILLECTOMY       OB History   No obstetric history on file.     Family History  Family history unknown: Yes    Social History    Tobacco Use  . Smoking status: Former Smoker    Packs/day: 1.00  . Smokeless tobacco: Never Used  Substance Use Topics  . Alcohol use: No  . Drug use: No    Home Medications Prior to Admission medications   Medication Sig Start Date End Date Taking? Authorizing Provider  acetaminophen (TYLENOL) 325 MG tablet Take 2 tablets (650 mg total) by mouth every 6 (six) hours as needed for mild pain, fever or headache (or Fever >/= 101). 03/04/19  Yes Lucky Cowboy, MD  albuterol (PROVENTIL HFA;VENTOLIN HFA) 108 (90 Base) MCG/ACT inhaler Inhale 2 puffs into the lungs every 6 (six) hours as needed for wheezing or shortness of breath. 04/17/17  Yes Purohit, Konrad Dolores, MD  alendronate (FOSAMAX) 70 MG tablet Take 70 mg by mouth every Sunday. Take with a full glass of water on an empty stomach.    Yes [provider]  apixaban (ELIQUIS) 5 MG TABS tablet Take 1 tablet (5 mg total) by mouth 2 (two) times daily. 03/08/19  Yes Lucky Cowboy, MD  Calcium Carb-Cholecalciferol 600-400 MG-UNIT TABS Take 1 tablet by mouth 2 (two) times daily.   Yes [provider]  citalopram (CELEXA) 40 MG tablet Take 0.5 tablets (20 mg total) by mouth daily. 03/04/19  Yes Lucky Cowboy, MD  donepezil (ARICEPT) 10 MG tablet Take 10 mg by mouth at bedtime.   Yes  [provider]  famotidine (PEPCID) 20 MG tablet Take 20 mg by mouth at bedtime. 02/01/19  Yes [provider]  ferrous sulfate 325 (65 FE) MG tablet Take 325 mg by mouth daily with breakfast.   Yes [provider]  LORazepam (ATIVAN) 0.5 MG tablet Take 0.5 mg by mouth daily as needed. 02/04/19  Yes [provider]  mirtazapine (REMERON) 7.5 MG tablet Take 7.5 mg by mouth at bedtime. 01/28/19  Yes [provider]  polyethylene glycol (MIRALAX / GLYCOLAX) packet Take 17 g by mouth daily.   Yes [provider]  apixaban (ELIQUIS) 5 MG TABS tablet Take 2 tablets (10 mg total) by mouth 2 (two) times daily for 3  days. 03/04/19 03/07/19  Lucky Cowboy, MD  ciprofloxacin (CIPRO) 500 MG tablet Take 500 mg by mouth 2 (two) times daily. 05/05/19   [provider]  potassium chloride (KLOR-CON) 10 MEQ tablet Take 2 tablets (20 mEq total) by mouth daily. Patient not taking: Reported on 05/19/2019 03/04/19   Lucky Cowboy, MD  potassium chloride SA (KLOR-CON) 20 MEQ tablet Take 1 tablet (20 mEq total) by mouth 2 (two) times daily. 05/19/19   Davonna Belling, MD  Spacer/Aero Chamber Mouthpiece MISC 1 Units by Does not apply route every 4 (four) hours as needed (wheezing). Patient not taking: Reported on 02/28/2019 04/17/17   Cristy Folks, MD    Allergies    Patient has no known allergies.  Review of Systems   Review of Systems  Unable to perform ROS: Mental status change    Physical Exam Updated Vital Signs BP (!) 172/67   Pulse 63   Temp 98.5 F (36.9 C) (Rectal)   Resp 13   Ht 5' (1.524 m)   Wt 56.7 kg   SpO2 100%   BMI 24.41 kg/m   Physical Exam Vitals reviewed.  HENT:     Head: Atraumatic.  Eyes:     Pupils: Pupils are equal, round, and reactive to light.  Cardiovascular:     Rate and Rhythm: Bradycardia present.  Pulmonary:     Breath sounds: No wheezing or rhonchi.  Abdominal:     Tenderness: There is no abdominal tenderness.  Musculoskeletal:     Cervical back: Neck supple.  Skin:    General: Skin is warm.     Capillary Refill: Capillary refill takes less than 2 seconds.  Neurological:     Mental Status: She is alert.     Comments: Patient awake and answering questions.  Alert to self.  Moving all extremities.     ED Results / Procedures / Treatments   Labs (all labs ordered are listed, but only abnormal results are displayed) Labs Reviewed  COMPREHENSIVE METABOLIC PANEL - Abnormal; Notable for the following components:      Result Value   Potassium 2.8 (*)    Creatinine, Ser 1.16 (*)    Albumin 3.4 (*)    Alkaline Phosphatase 31 (*)    GFR calc non Af Amer  41 (*)    GFR calc Af Amer 48 (*)    All other components within normal limits  CBC WITH DIFFERENTIAL/PLATELET - Abnormal; Notable for the following components:   RBC 3.80 (*)    Hemoglobin 11.8 (*)    All other components within normal limits  URINALYSIS, ROUTINE W REFLEX MICROSCOPIC - Abnormal; Notable for the following components:   APPearance HAZY (*)    All other components within normal limits  TSH  EKG None ED ECG REPORT   Date: 05/19/2019  Rate: 56  Rhythm: sinus bradycardia  QRS Axis: normal  Intervals: normal  ST/T Wave abnormalities: normal  Conduction Disutrbances:none  Narrative Interpretation:   Old EKG Reviewed: changes noted right mildly decreased from prior    Radiology CT Head Wo Contrast  Result Date: 05/19/2019 CLINICAL DATA:  86 year old female with history of altered mental status. EXAM: CT HEAD WITHOUT CONTRAST TECHNIQUE: Contiguous axial images were obtained from the base of the skull through the vertex without intravenous contrast. COMPARISON:  Head CT 10/08/2010. FINDINGS: Brain: Severe cerebral and cerebellar atrophy. Patchy and confluent areas of decreased attenuation are noted throughout the deep and periventricular white matter of the cerebral hemispheres bilaterally, compatible with chronic microvascular ischemic disease. No evidence of acute infarction, hemorrhage, hydrocephalus, extra-axial collection or mass lesion/mass effect. Vascular: No hyperdense vessel or unexpected calcification. Skull: Normal. Negative for fracture or focal lesion. Sinuses/Orbits: No acute finding. Other: None. IMPRESSION: 1. No acute intracranial abnormalities. 2. Severe cerebral and cerebellar atrophy with extensive chronic microvascular ischemic changes in the cerebral white matter, as above. Electronically Signed   By: Vinnie Langton M.D.   On: 05/19/2019 08:01   CT Angio Chest PE W and/or Wo Contrast  Result Date: 05/19/2019 CLINICAL DATA:  Shortness of breath.   Recent pulmonary embolus. EXAM: CT ANGIOGRAPHY CHEST WITH CONTRAST TECHNIQUE: Multidetector CT imaging of the chest was performed using the standard protocol during bolus administration of intravenous contrast. Multiplanar CT image reconstructions and MIPs were obtained to evaluate the vascular anatomy. CONTRAST:  61mL OMNIPAQUE IOHEXOL 350 MG/ML SOLN COMPARISON:  Chest x-ray May 19, 2019, chest x-ray February 28, 2019, CT angiogram of the chest February 28, 2019. FINDINGS: Cardiovascular: Left and right coronary artery calcified atherosclerosis is identified. The heart is unchanged. Atherosclerosis is seen in the nonaneurysmal aorta without dissection. The previously identified pulmonary emboli have resolved. No acute emboli are seen on today's study. Mediastinum/Nodes: No effusions. Stable moderate hiatal hernia. The thyroid is normal in appearance. No adenopathy. The chest wall is stable. Lungs/Pleura: Central airways are normal. No pneumothorax. A small opacity is seen in the superior segment of the left lower lobe on series 10, image 72, unchanged. No other abnormalities in the left lung. Cystic change in the right upper lobe with surrounding soft tissue attenuation measures 3.6 cm, unchanged in the interval. The cystic changes extend to the right hilum, also unchanged. Linear platelike opacity seen just posterior and inferior to the cystic changes extending to the periphery, stable. Similar cystic changes are seen in the lateral sus. Segment of the right lower lobe, also unchanged. Peripheral platelike opacity is seen in the inferior right lateral lung base, unchanged. Atelectasis in the right middle lobe, similar in the interval. No other interval changes or acute abnormalities are seen in the right lung. Upper Abdomen: No acute abnormality. Musculoskeletal: Multiple stable compression fractures in the thoracic and upper lumbar spine. Review of the MIP images confirms the above findings. IMPRESSION: 1.  Interval resolution of pulmonary emboli. No acute emboli identified. 2. Cystic changes with surrounding soft tissue attenuation in the right upper lobe and the periphery of the right lower lobe is not specific but favored to represent sequela of previous infection or inflammation. Neoplasm considered less likely. Recommend attention on a follow-up CT scan in 6 months. 3. No acute pulmonary infiltrate. 4. Moderate hiatal hernia. 5. Atherosclerotic change in the thoracic aorta. Left right coronary artery calcified atherosclerosis. 6. Stable compression fractures in  the thoracic and upper lumbar spine. Aortic Atherosclerosis (ICD10-I70.0). Electronically Signed   By: Dorise Bullion III M.D   On: 05/19/2019 11:17   DG Chest Portable 1 View  Result Date: 05/19/2019 CLINICAL DATA:  86 year old female with history of altered mental status. EXAM: PORTABLE CHEST 1 VIEW COMPARISON:  Chest x-ray 02/28/2019. FINDINGS: Peripheral areas of opacity in the mid to lower lung on the right involving portions of the inferior right upper lobe and right middle and/or lower lobe, increased compared to the prior examination. Interstitial prominence throughout the right lung, increased compared to the prior examination. Left lung is clear. No definite left pleural effusion. No evidence of pulmonary edema. Heart size is borderline enlarged. Upper mediastinal contours are within normal limits. Aortic atherosclerosis. IMPRESSION: 1. Unusual appearance in the right hemithorax, as detailed above. Findings could suggest the presence of loculated pleural fluid, or could reflect peripheral airspace consolidation in the right lung as can be seen in the setting of infection or infarction. Further evaluation with PE protocol CT scan is suggested if clinically appropriate. 2. Aortic atherosclerosis. Electronically Signed   By: Vinnie Langton M.D.   On: 05/19/2019 08:09    Procedures Procedures (including critical care time)  Medications  Ordered in ED Medications  sodium chloride (PF) 0.9 % injection (has no administration in time range)  potassium chloride 10 mEq in 100 mL IVPB (0 mEq Intravenous Stopped 05/19/19 1243)  iohexol (OMNIPAQUE) 350 MG/ML injection 75 mL (75 mLs Intravenous Contrast Given 05/19/19 1013)    ED Course  I have reviewed the triage vital signs and the nursing notes.  Pertinent labs & imaging results that were available during my care of the patient were reviewed by me and considered in my medical decision making (see chart for details).    MDM Rules/Calculators/A&P                      Patient with reported decreased mental status.  Appears to be at baseline now.  Feeling fine.  Had been on Cipro for UTI.  Initial chest x-ray abnormal.  CT scan done and overall reassuring.  Patient feels better.  Mild hypokalemia.  Will discharge home. Final Clinical Impression(s) / ED Diagnoses Final diagnoses:  Weakness  Hypokalemia    Rx / DC Orders ED Discharge Orders         Ordered    potassium chloride SA (KLOR-CON) 20 MEQ tablet  2 times daily     05/19/19 1241           Davonna Belling, MD 05/19/19 1300    Davonna Belling, MD 05/19/19 1316

## 2019-06-15 ENCOUNTER — Emergency Department (HOSPITAL_COMMUNITY)
Admission: EM | Admit: 2019-06-15 | Discharge: 2019-06-15 | Disposition: A | Discharge status: Home / Self Care | Payer: Medicare Other | Attending: Emergency Medicine | Admitting: Emergency Medicine

## 2019-06-15 ENCOUNTER — Other Ambulatory Visit: Payer: Self-pay

## 2019-06-15 ENCOUNTER — Encounter (HOSPITAL_COMMUNITY): Payer: Self-pay

## 2019-06-15 ENCOUNTER — Emergency Department (HOSPITAL_COMMUNITY): Payer: Medicare Other

## 2019-06-15 DIAGNOSIS — R519 Headache, unspecified: Secondary | ICD-10-CM | POA: Insufficient documentation

## 2019-06-15 DIAGNOSIS — Z7901 Long term (current) use of anticoagulants: Secondary | ICD-10-CM | POA: Diagnosis not present

## 2019-06-15 DIAGNOSIS — Y999 Unspecified external cause status: Secondary | ICD-10-CM | POA: Diagnosis not present

## 2019-06-15 DIAGNOSIS — Y92129 Unspecified place in nursing home as the place of occurrence of the external cause: Secondary | ICD-10-CM | POA: Insufficient documentation

## 2019-06-15 DIAGNOSIS — Z87891 Personal history of nicotine dependence: Secondary | ICD-10-CM | POA: Diagnosis not present

## 2019-06-15 DIAGNOSIS — F0390 Unspecified dementia without behavioral disturbance: Secondary | ICD-10-CM | POA: Diagnosis not present

## 2019-06-15 DIAGNOSIS — W19XXXA Unspecified fall, initial encounter: Secondary | ICD-10-CM

## 2019-06-15 DIAGNOSIS — Z79899 Other long term (current) drug therapy: Secondary | ICD-10-CM | POA: Insufficient documentation

## 2019-06-15 DIAGNOSIS — Y939 Activity, unspecified: Secondary | ICD-10-CM | POA: Insufficient documentation

## 2019-06-15 DIAGNOSIS — N390 Urinary tract infection, site not specified: Secondary | ICD-10-CM | POA: Diagnosis not present

## 2019-06-15 LAB — CBC WITH DIFFERENTIAL/PLATELET
Abs Immature Granulocytes: 0.02 10*3/uL (ref 0.00–0.07)
Basophils Absolute: 0 10*3/uL (ref 0.0–0.1)
Basophils Relative: 0 %
Eosinophils Absolute: 0.1 10*3/uL (ref 0.0–0.5)
Eosinophils Relative: 2 %
HCT: 37.2 % (ref 36.0–46.0)
Hemoglobin: 11.9 g/dL — ABNORMAL LOW (ref 12.0–15.0)
Immature Granulocytes: 0 %
Lymphocytes Relative: 28 %
Lymphs Abs: 1.4 10*3/uL (ref 0.7–4.0)
MCH: 31.4 pg (ref 26.0–34.0)
MCHC: 32 g/dL (ref 30.0–36.0)
MCV: 98.2 fL (ref 80.0–100.0)
Monocytes Absolute: 0.6 10*3/uL (ref 0.1–1.0)
Monocytes Relative: 11 %
Neutro Abs: 2.9 10*3/uL (ref 1.7–7.7)
Neutrophils Relative %: 59 %
Platelets: 224 10*3/uL (ref 150–400)
RBC: 3.79 MIL/uL — ABNORMAL LOW (ref 3.87–5.11)
RDW: 14 % (ref 11.5–15.5)
WBC: 5.1 10*3/uL (ref 4.0–10.5)
nRBC: 0 % (ref 0.0–0.2)

## 2019-06-15 LAB — BASIC METABOLIC PANEL
Anion gap: 5 (ref 5–15)
BUN: 16 mg/dL (ref 8–23)
CO2: 30 mmol/L (ref 22–32)
Calcium: 10.1 mg/dL (ref 8.9–10.3)
Chloride: 105 mmol/L (ref 98–111)
Creatinine, Ser: 1.23 mg/dL — ABNORMAL HIGH (ref 0.44–1.00)
GFR calc Af Amer: 44 mL/min — ABNORMAL LOW (ref >60)
GFR calc non Af Amer: 38 mL/min — ABNORMAL LOW (ref >60)
Glucose, Bld: 97 mg/dL (ref 70–99)
Potassium: 4.6 mmol/L (ref 3.5–5.1)
Sodium: 140 mmol/L (ref 135–145)

## 2019-06-15 LAB — URINALYSIS, ROUTINE W REFLEX MICROSCOPIC
Bilirubin Urine: NEGATIVE
Glucose, UA: NEGATIVE mg/dL
Ketones, ur: NEGATIVE mg/dL
Nitrite: POSITIVE — AB
Protein, ur: NEGATIVE mg/dL
Specific Gravity, Urine: 1.009 (ref 1.005–1.030)
pH: 9 — ABNORMAL HIGH (ref 5.0–8.0)

## 2019-06-15 MED ORDER — CEPHALEXIN 250 MG PO CAPS: 250.0000 mg | ORAL_CAPSULE | Freq: Four times a day (QID) | ORAL | 0 refills | Status: DC

## 2019-06-15 MED ORDER — CEPHALEXIN 250 MG PO CAPS
250.0000 mg | ORAL_CAPSULE | Freq: Once | ORAL | Status: AC
  Administered 2019-06-15: 250 mg via ORAL
  Filled 2019-06-15: qty 1

## 2019-06-15 NOTE — ED Notes (Signed)
[facility] arrived to provide transport.

## 2019-06-15 NOTE — ED Notes (Signed)
Patient transported to CT at this time. 

## 2019-06-15 NOTE — Discharge Instructions (Signed)
You were seen in the emergency department for evaluation of injuries from a fall.  You had a CAT scan of your head and cervical spine that did not show any acute findings.  You had a urinalysis that showed a possible urinary tract infection.  We are starting you on antibiotics for that.  Please follow-up with your doctor and return to the emergency department if any worsening symptoms

## 2019-06-15 NOTE — ED Notes (Signed)
[facility] has been contacted regarding patient transport.  

## 2019-06-15 NOTE — ED Triage Notes (Signed)
Pt BIB [facility] from [facility] after an unwitnessed fall. Pt complaining of head pain. No obvious injury noted. Denies neck or back pain. No blood thinners. Pt at baseline per staff. Hx Alzheimers.

## 2019-06-15 NOTE — ED Provider Notes (Signed)
[facility] DEPT Provider Note   CSN: [id number] Arrival date & time: 06/15/19  [id number]     History Chief Complaint  Patient presents with  . Fall    Dana Bush is a 86 y.o. female.  Level 5 caveat secondary to dementia.  Patient is a resident at [facility] living.  Found by staff on the floor.  Possible fall.  EMS reports had some head pain.  Patient denies any complaints.  Does not recall fall.  The history is provided by the EMS personnel and the patient.  Fall This is a new problem. The problem has not changed since onset.Pertinent negatives include no chest pain, no abdominal pain, no headaches and no shortness of breath. Nothing aggravates the symptoms. Nothing relieves the symptoms. She has tried nothing for the symptoms. The treatment provided no relief.       Past Medical History:  Diagnosis Date  . Anxiety   . Compression fracture of L2 lumbar vertebra (HCC)   . Dementia ([place])   . Depression   . Hyperlipemia   . IgG monoclonal gammopathy   . Osteoporosis   . Peptic ulcer disease     Patient Active Problem List   Diagnosis Date Noted  . Acute pulmonary embolism ([place]) 02/28/2019  . ARF (acute renal failure) ([place]) 02/28/2019  . Influenza A 04/14/2017  . Dementia ([place]) 04/14/2017  . Hyperglycemia 04/14/2017  . Malnutrition of moderate degree 04/14/2017  . Sepsis due to pneumonia ([place]) 04/13/2017  . Chest pain 06/17/2016  . Left leg swelling 06/17/2016  . Drug overdose, accidental or unintentional, initial encounter 06/17/2016  . Malnutrition of mild degree ([place]) 01/09/2011  . Dizziness 01/09/2011  . Compression fracture of L1 lumbar vertebra ([place]) 01/05/2011  . Fall 01/05/2011  . Hyponatremia 01/05/2011  . Osteoporosis 01/05/2011    Past Surgical History:  Procedure Laterality Date  . cervical polyp removal    . DILATION AND CURETTAGE OF UTERUS    . TONSILLECTOMY       OB History   No obstetric history on  file.     Family History  Family history unknown: Yes    Social History   Tobacco Use  . Smoking status: Former Smoker    Packs/day: 1.00  . Smokeless tobacco: Never Used  Substance Use Topics  . Alcohol use: No  . Drug use: No    Home Medications Prior to Admission medications   Medication Sig Start Date End Date Taking? Authorizing Provider  acetaminophen (TYLENOL) 325 MG tablet Take 2 tablets (650 mg total) by mouth every 6 (six) hours as needed for mild pain, fever or headache (or Fever >/= 101). 03/04/19   Lucky Cowboy, MD  albuterol (PROVENTIL HFA;VENTOLIN HFA) 108 (90 Base) MCG/ACT inhaler Inhale 2 puffs into the lungs every 6 (six) hours as needed for wheezing or shortness of breath. 04/17/17   Purohit, Konrad Dolores, MD  alendronate (FOSAMAX) 70 MG tablet Take 70 mg by mouth every Sunday. Take with a full glass of water on an empty stomach.     [provider]  apixaban (ELIQUIS) 5 MG TABS tablet Take 2 tablets (10 mg total) by mouth 2 (two) times daily for 3 days. 03/04/19 03/07/19  Lucky Cowboy, MD  apixaban (ELIQUIS) 5 MG TABS tablet Take 1 tablet (5 mg total) by mouth 2 (two) times daily. 03/08/19   Lucky Cowboy, MD  Calcium Carb-Cholecalciferol 600-400 MG-UNIT TABS Take 1 tablet by mouth 2 (two)  times daily.    [provider]  ciprofloxacin (CIPRO) 500 MG tablet Take 500 mg by mouth 2 (two) times daily. 05/05/19   [provider]  citalopram (CELEXA) 40 MG tablet Take 0.5 tablets (20 mg total) by mouth daily. 03/04/19   Lucky Cowboy, MD  donepezil (ARICEPT) 10 MG tablet Take 10 mg by mouth at bedtime.    [provider]  famotidine (PEPCID) 20 MG tablet Take 20 mg by mouth at bedtime. 02/01/19   [provider]  ferrous sulfate 325 (65 FE) MG tablet Take 325 mg by mouth daily with breakfast.    [provider]  LORazepam (ATIVAN) 0.5 MG tablet Take 0.5 mg by mouth daily as needed. 02/04/19   [provider]    mirtazapine (REMERON) 7.5 MG tablet Take 7.5 mg by mouth at bedtime. 01/28/19   [provider]  polyethylene glycol (MIRALAX / GLYCOLAX) packet Take 17 g by mouth daily.    [provider]  potassium chloride (KLOR-CON) 10 MEQ tablet Take 2 tablets (20 mEq total) by mouth daily. Patient not taking: Reported on 05/19/2019 03/04/19   Lucky Cowboy, MD  potassium chloride SA (KLOR-CON) 20 MEQ tablet Take 1 tablet (20 mEq total) by mouth 2 (two) times daily. 05/19/19   Davonna Belling, MD  Spacer/Aero Chamber Mouthpiece MISC 1 Units by Does not apply route every 4 (four) hours as needed (wheezing). Patient not taking: Reported on 02/28/2019 04/17/17   Cristy Folks, MD    Allergies    Patient has no known allergies.  Review of Systems   Review of Systems  Unable to perform ROS: Dementia  Respiratory: Negative for shortness of breath.   Cardiovascular: Negative for chest pain.  Gastrointestinal: Negative for abdominal pain.  Neurological: Negative for headaches.    Physical Exam Updated Vital Signs BP 120/62 (BP Location: Left Arm)   Pulse 66   Resp 18   SpO2 99%   Physical Exam Vitals and nursing note reviewed.  Constitutional:      General: She is not in acute distress.    Appearance: Normal appearance. She is well-developed.  HENT:     Head: Normocephalic and atraumatic.  Eyes:     Conjunctiva/sclera: Conjunctivae normal.  Cardiovascular:     Rate and Rhythm: Normal rate and regular rhythm.     Heart sounds: No murmur.  Pulmonary:     Effort: Pulmonary effort is normal. No respiratory distress.     Breath sounds: Normal breath sounds.  Abdominal:     Palpations: Abdomen is soft.     Tenderness: There is no abdominal tenderness.  Musculoskeletal:        General: No deformity or signs of injury. Normal range of motion.     Cervical back: Neck supple.     Comments: Full range of motion of upper and lower extremities.  No obvious pain or limitation.   Intact distal pulses.  Cervical thoracic lumbar spine without any tenderness.  Skin:    General: Skin is warm and dry.     Capillary Refill: Capillary refill takes less than 2 seconds.  Neurological:     General: No focal deficit present.     Mental Status: She is alert. Mental status is at baseline.     Comments: Patient is awake and alert.  Oriented to self.  Moving all extremities and following commands.     ED Results / Procedures / Treatments   Labs (all labs ordered are  listed, but only abnormal results are displayed) Labs Reviewed  BASIC METABOLIC PANEL - Abnormal; Notable for the following components:      Result Value   Creatinine, Ser 1.23 (*)    GFR calc non Af Amer 38 (*)    GFR calc Af Amer 44 (*)    All other components within normal limits  CBC WITH DIFFERENTIAL/PLATELET - Abnormal; Notable for the following components:   RBC 3.79 (*)    Hemoglobin 11.9 (*)    All other components within normal limits  URINALYSIS, ROUTINE W REFLEX MICROSCOPIC - Abnormal; Notable for the following components:   pH 9.0 (*)    Hgb urine dipstick SMALL (*)    Nitrite POSITIVE (*)    Leukocytes,Ua MODERATE (*)    Bacteria, UA RARE (*)    All other components within normal limits  URINE CULTURE    EKG None  Radiology CT Head Wo Contrast  Result Date: 06/15/2019 CLINICAL DATA:  Unwitnessed fall, headache, Alzheimer's EXAM: CT HEAD WITHOUT CONTRAST CT CERVICAL SPINE WITHOUT CONTRAST TECHNIQUE: Multidetector CT imaging of the head and cervical spine was performed following the standard protocol without intravenous contrast. Multiplanar CT image reconstructions of the cervical spine were also generated. COMPARISON:  CT head dated 05/19/2019 FINDINGS: CT HEAD FINDINGS Brain: No evidence of acute infarction, hemorrhage, hydrocephalus, extra-axial collection or mass lesion/mass effect. Severe global cortical and central atrophy. Subcortical white matter and periventricular small vessel  ischemic changes. Vascular: No hyperdense vessel or unexpected calcification. Skull: Normal. Negative for fracture or focal lesion. Sinuses/Orbits: The visualized paranasal sinuses are essentially clear. The mastoid air cells are unopacified. Other: None. CT CERVICAL SPINE FINDINGS Alignment: Normal cervical lordosis. Skull base and vertebrae: No acute fracture. No primary bone lesion or focal pathologic process. Soft tissues and spinal canal: No prevertebral fluid or swelling. No visible canal hematoma. Disc levels: Mild degenerative changes of the lower cervical spine. Spinal canal is patent. Upper chest: Visualized lung apices are clear, noting mild paraseptal emphysematous changes. Other: Visualized thyroid is notable for a 10 mm left thyroid nodule. IMPRESSION: No evidence of acute intracranial abnormality. Extensive atrophy with small vessel ischemic changes. No evidence of traumatic injury to the cervical spine. Electronically Signed   By: Julian Hy M.D.   On: 06/15/2019 08:01   CT Cervical Spine Wo Contrast  Result Date: 06/15/2019 CLINICAL DATA:  Unwitnessed fall, headache, Alzheimer's EXAM: CT HEAD WITHOUT CONTRAST CT CERVICAL SPINE WITHOUT CONTRAST TECHNIQUE: Multidetector CT imaging of the head and cervical spine was performed following the standard protocol without intravenous contrast. Multiplanar CT image reconstructions of the cervical spine were also generated. COMPARISON:  CT head dated 05/19/2019 FINDINGS: CT HEAD FINDINGS Brain: No evidence of acute infarction, hemorrhage, hydrocephalus, extra-axial collection or mass lesion/mass effect. Severe global cortical and central atrophy. Subcortical white matter and periventricular small vessel ischemic changes. Vascular: No hyperdense vessel or unexpected calcification. Skull: Normal. Negative for fracture or focal lesion. Sinuses/Orbits: The visualized paranasal sinuses are essentially clear. The mastoid air cells are unopacified. Other:  None. CT CERVICAL SPINE FINDINGS Alignment: Normal cervical lordosis. Skull base and vertebrae: No acute fracture. No primary bone lesion or focal pathologic process. Soft tissues and spinal canal: No prevertebral fluid or swelling. No visible canal hematoma. Disc levels: Mild degenerative changes of the lower cervical spine. Spinal canal is patent. Upper chest: Visualized lung apices are clear, noting mild paraseptal emphysematous changes. Other: Visualized thyroid is notable for a 10 mm left thyroid nodule. IMPRESSION: No  evidence of acute intracranial abnormality. Extensive atrophy with small vessel ischemic changes. No evidence of traumatic injury to the cervical spine. Electronically Signed   By: Julian Hy M.D.   On: 06/15/2019 08:01    Procedures Procedures (including critical care time)  Medications Ordered in ED Medications  cephALEXin (KEFLEX) capsule 250 mg (250 mg Oral Given 06/15/19 1105)    ED Course  I have reviewed the triage vital signs and the nursing notes.  Pertinent labs & imaging results that were available during my care of the patient were reviewed by me and considered in my medical decision making (see chart for details).  Clinical Course as of Jun 15 1715  Sat Jun 15, 2019  1006 She ambulated to bathroom with assistance.   [MB]    Clinical Course User Index [MB] Hayden Rasmussen, MD   MDM Rules/Calculators/A&P                     This patient complains of fall fall; this involves an extensive number of treatment Options and is a complaint that carries with it a high risk of complications and Morbidity. The differential includes skull fracture, cervical fracture, intracranial bleed, metabolic derangement, anemia, infection  I ordered, reviewed and interpreted labs, which included normal white count, stable hemoglobin, chemistries with a mild elevation in creatinine, urinalysis likely infection with 21-50 whites nitrite positive I ordered medication  Keflex I ordered imaging studies which included CT head C-spine and I independently    visualized and interpreted imaging which showed no acute fracture or bleed Previous records obtained and reviewed in epic  After the interventions stated above, I reevaluated the patient and found patient to be stable and resting comfortably.  Still with no complaints.  Ambulated in the department with assistance.  Patient's nurse was going to check back with the facility to confirm they are comfortable taking her back and she is at baseline.   Final Clinical Impression(s) / ED Diagnoses Final diagnoses:  Fall, initial encounter  Lower urinary tract infectious disease    Rx / DC Orders ED Discharge Orders         Ordered    cephALEXin (KEFLEX) 250 MG capsule  4 times daily     06/15/19 1034           Hayden Rasmussen, MD 06/15/19 1720

## 2019-06-15 NOTE — ED Notes (Signed)
Pt ambulated to bathroom with two person assist.

## 2019-06-17 LAB — URINE CULTURE

## 2019-06-22 ENCOUNTER — Emergency Department (HOSPITAL_COMMUNITY): Payer: Medicare Other

## 2019-06-22 ENCOUNTER — Emergency Department (HOSPITAL_COMMUNITY)
Admission: EM | Admit: 2019-06-22 | Discharge: 2019-06-22 | Disposition: A | Discharge status: Home / Self Care | Payer: Medicare Other | Attending: Emergency Medicine | Admitting: Emergency Medicine

## 2019-06-22 DIAGNOSIS — Z79899 Other long term (current) drug therapy: Secondary | ICD-10-CM | POA: Diagnosis not present

## 2019-06-22 DIAGNOSIS — S8264XA Nondisplaced fracture of lateral malleolus of right fibula, initial encounter for closed fracture: Secondary | ICD-10-CM | POA: Diagnosis not present

## 2019-06-22 DIAGNOSIS — Y999 Unspecified external cause status: Secondary | ICD-10-CM | POA: Diagnosis not present

## 2019-06-22 DIAGNOSIS — F0390 Unspecified dementia without behavioral disturbance: Secondary | ICD-10-CM | POA: Diagnosis not present

## 2019-06-22 DIAGNOSIS — Z7901 Long term (current) use of anticoagulants: Secondary | ICD-10-CM | POA: Insufficient documentation

## 2019-06-22 DIAGNOSIS — M542 Cervicalgia: Secondary | ICD-10-CM | POA: Insufficient documentation

## 2019-06-22 DIAGNOSIS — Y92129 Unspecified place in nursing home as the place of occurrence of the external cause: Secondary | ICD-10-CM | POA: Insufficient documentation

## 2019-06-22 DIAGNOSIS — W1839XA Other fall on same level, initial encounter: Secondary | ICD-10-CM | POA: Insufficient documentation

## 2019-06-22 DIAGNOSIS — S99911A Unspecified injury of right ankle, initial encounter: Secondary | ICD-10-CM | POA: Diagnosis present

## 2019-06-22 DIAGNOSIS — Y9389 Activity, other specified: Secondary | ICD-10-CM | POA: Insufficient documentation

## 2019-06-22 DIAGNOSIS — S82891A Other fracture of right lower leg, initial encounter for closed fracture: Secondary | ICD-10-CM

## 2019-06-22 LAB — CBC
HCT: 37.3 % (ref 36.0–46.0)
Hemoglobin: 12 g/dL (ref 12.0–15.0)
MCH: 31.3 pg (ref 26.0–34.0)
MCHC: 32.2 g/dL (ref 30.0–36.0)
MCV: 97.1 fL (ref 80.0–100.0)
Platelets: 275 10*3/uL (ref 150–400)
RBC: 3.84 MIL/uL — ABNORMAL LOW (ref 3.87–5.11)
RDW: 13.8 % (ref 11.5–15.5)
WBC: 8.1 10*3/uL (ref 4.0–10.5)
nRBC: 0 % (ref 0.0–0.2)

## 2019-06-22 LAB — BASIC METABOLIC PANEL
Anion gap: 9 (ref 5–15)
BUN: 17 mg/dL (ref 8–23)
CO2: 28 mmol/L (ref 22–32)
Calcium: 9.9 mg/dL (ref 8.9–10.3)
Chloride: 102 mmol/L (ref 98–111)
Creatinine, Ser: 1.22 mg/dL — ABNORMAL HIGH (ref 0.44–1.00)
GFR calc Af Amer: 45 mL/min — ABNORMAL LOW (ref >60)
GFR calc non Af Amer: 39 mL/min — ABNORMAL LOW (ref >60)
Glucose, Bld: 105 mg/dL — ABNORMAL HIGH (ref 70–99)
Potassium: 4.4 mmol/L (ref 3.5–5.1)
Sodium: 139 mmol/L (ref 135–145)

## 2019-06-22 NOTE — ED Notes (Signed)
Patient verbalizes understanding of discharge instructions. Opportunity for questioning and answers were provided. Armband removed by staff, pt discharged from ED via [place].

## 2019-06-22 NOTE — ED Notes (Signed)
ptar called 

## 2019-06-22 NOTE — Discharge Instructions (Signed)
The x-ray showed a fracture on the outside of your ankle bone.  You can put weight on your ankle as needed.  Wear the brace at all times although you can remove it to bathe.  Follow-up with your orthopedic doctor for further evaluation.

## 2019-06-22 NOTE — ED Triage Notes (Signed)
BIB EMS as Level 2 Trauma - Fall on blood thinners - Eliquis. Patient had unwitnessed fall at facility [facility]) while attempting to transfer from bed to wheelchair. Found on floor by staff. Possible deformity to RLE. VSS. A/O at baseline, hx of dementia.

## 2019-06-22 NOTE — ED Provider Notes (Signed)
[facility] EMERGENCY DEPARTMENT Provider Note   CSN: [id number] Arrival date & time: 06/22/19  1537     History Chief Complaint  Patient presents with  . Fall    Dana Bush is a 86 y.o. female.  HPI   Patient presents the emergency room for evaluation after an unwitnessed fall.  [facility] is a [facility].  She was attempting to transfer from a bed to wheelchair.  Patient was found on the floor by staff.  Patient complains of pain and swelling in her right ankle but otherwise denies any injuries.  Patient does have dementia.  She cannot tell me exactly what happened.  Patient states it was like falling out of a truck.  She denies hitting her head.  She denies losing consciousness.  Patient was activated as a level 2 trauma because she is on blood thinners and there was the potential for head injury.  Past Medical History:  Diagnosis Date  . Anxiety   . Compression fracture of L2 lumbar vertebra (HCC)   . Dementia ([place])   . Depression   . Hyperlipemia   . IgG monoclonal gammopathy   . Osteoporosis   . Peptic ulcer disease     Patient Active Problem List   Diagnosis Date Noted  . Acute pulmonary embolism ([place]) 02/28/2019  . ARF (acute renal failure) ([place]) 02/28/2019  . Influenza A 04/14/2017  . Dementia ([place]) 04/14/2017  . Hyperglycemia 04/14/2017  . Malnutrition of moderate degree 04/14/2017  . Sepsis due to pneumonia ([place]) 04/13/2017  . Chest pain 06/17/2016  . Left leg swelling 06/17/2016  . Drug overdose, accidental or unintentional, initial encounter 06/17/2016  . Malnutrition of mild degree ([place]) 01/09/2011  . Dizziness 01/09/2011  . Compression fracture of L1 lumbar vertebra ([place]) 01/05/2011  . Fall 01/05/2011  . Hyponatremia 01/05/2011  . Osteoporosis 01/05/2011    Past Surgical History:  Procedure Laterality Date  . cervical polyp removal    . DILATION AND CURETTAGE OF UTERUS    . TONSILLECTOMY        OB History   No obstetric history on file.     Family History  Family history unknown: Yes    Social History   Tobacco Use  . Smoking status: Former Smoker    Packs/day: 1.00  . Smokeless tobacco: Never Used  Substance Use Topics  . Alcohol use: No  . Drug use: No    Home Medications Prior to Admission medications   Medication Sig Start Date End Date Taking? Authorizing Provider  acetaminophen (TYLENOL) 325 MG tablet Take 2 tablets (650 mg total) by mouth every 6 (six) hours as needed for mild pain, fever or headache (or Fever >/= 101). 03/04/19  Yes Lucky Cowboy, MD  albuterol (PROVENTIL HFA;VENTOLIN HFA) 108 (90 Base) MCG/ACT inhaler Inhale 2 puffs into the lungs every 6 (six) hours as needed for wheezing or shortness of breath. 04/17/17  Yes Purohit, Konrad Dolores, MD  alendronate (FOSAMAX) 70 MG tablet Take 70 mg by mouth every Sunday. Take with a full glass of water on an empty stomach.    Yes [provider]  apixaban (ELIQUIS) 5 MG TABS tablet Take 1 tablet (5 mg total) by mouth 2 (two) times daily. 03/08/19  Yes Lucky Cowboy, MD  Calcium Carb-Cholecalciferol 600-400 MG-UNIT TABS Take 1 tablet by mouth 2 (two) times daily.   Yes [provider]  citalopram (CELEXA) 20 MG tablet Take 20 mg by  mouth daily at 6 (six) AM.   Yes [provider]  donepezil (ARICEPT) 10 MG tablet Take 10 mg by mouth at bedtime.   Yes [provider]  famotidine (PEPCID) 20 MG tablet Take 20 mg by mouth at bedtime. 02/01/19  Yes [provider]  ferrous sulfate 325 (65 FE) MG tablet Take 325 mg by mouth daily at 6 (six) AM.    Yes [provider]  LORazepam (ATIVAN) 0.5 MG tablet Take 0.5 mg by mouth daily as needed for anxiety.  02/04/19  Yes [provider]  mirtazapine (REMERON) 7.5 MG tablet Take 7.5 mg by mouth at bedtime. 01/28/19  Yes [provider]  polyethylene glycol (MIRALAX / GLYCOLAX) packet Take 17 g by mouth daily  at 6 (six) AM.    Yes [provider]  potassium chloride SA (KLOR-CON) 20 MEQ tablet Take 1 tablet (20 mEq total) by mouth 2 (two) times daily. Patient taking differently: Take 20 mEq by mouth daily at 12 noon.  05/19/19  Yes Davonna Belling, MD  cephALEXin (KEFLEX) 250 MG capsule Take 1 capsule (250 mg total) by mouth 4 (four) times daily. Patient not taking: Reported on 06/22/2019 06/15/19   Hayden Rasmussen, MD  citalopram (CELEXA) 40 MG tablet Take 0.5 tablets (20 mg total) by mouth daily. Patient not taking: Reported on 06/22/2019 03/04/19   Lucky Cowboy, MD  potassium chloride (KLOR-CON) 10 MEQ tablet Take 2 tablets (20 mEq total) by mouth daily. Patient not taking: Reported on 06/22/2019 03/04/19   Lucky Cowboy, MD  Spacer/Aero Chamber Mouthpiece MISC 1 Units by Does not apply route every 4 (four) hours as needed (wheezing). 04/17/17   Purohit, Konrad Dolores, MD    Allergies    Patient has no known allergies.  Review of Systems   Review of Systems  All other systems reviewed and are negative.   Physical Exam Updated Vital Signs BP 134/69   Pulse 63   Resp 14   Ht 1.626 m (5\' 4" )   Wt 52.2 kg   SpO2 99%   BMI 19.74 kg/m   Physical Exam Vitals and nursing note reviewed.  Constitutional:      General: She is not in acute distress.    Appearance: She is well-developed.  HENT:     Head: Normocephalic and atraumatic.     Right Ear: External ear normal.     Left Ear: External ear normal.  Eyes:     General: No scleral icterus.       Right eye: No discharge.        Left eye: No discharge.     Conjunctiva/sclera: Conjunctivae normal.  Neck:     Trachea: No tracheal deviation.  Cardiovascular:     Rate and Rhythm: Normal rate and regular rhythm.  Pulmonary:     Effort: Pulmonary effort is normal. No respiratory distress.     Breath sounds: Normal breath sounds. No stridor. No wheezing or rales.  Abdominal:     General: Bowel sounds are normal. There is no  distension.     Palpations: Abdomen is soft.     Tenderness: There is no abdominal tenderness. There is no guarding or rebound.  Musculoskeletal:     Right shoulder: No swelling, tenderness or bony tenderness.     Left shoulder: No swelling, tenderness or bony tenderness.     Right wrist: No swelling, tenderness or bony tenderness.     Left wrist: No swelling, tenderness or bony  tenderness.     Cervical back: Neck supple. Tenderness present. No swelling or bony tenderness.     Thoracic back: No swelling, tenderness or bony tenderness.     Lumbar back: No swelling, tenderness or bony tenderness.     Right hip: No tenderness or bony tenderness. Normal range of motion.     Left hip: No tenderness or bony tenderness. Normal range of motion.     Right ankle: Swelling present. Tenderness present.     Left ankle: No swelling. No tenderness.  Skin:    General: Skin is warm and dry.     Findings: No rash.  Neurological:     Mental Status: She is alert.     Cranial Nerves: No cranial nerve deficit (no facial droop, extraocular movements intact, no slurred speech).     Sensory: No sensory deficit.     Motor: No abnormal muscle tone or seizure activity.     Coordination: Coordination normal.     ED Results / Procedures / Treatments   Labs (all labs ordered are listed, but only abnormal results are displayed) Labs Reviewed  CBC - Abnormal; Notable for the following components:      Result Value   RBC 3.84 (*)    All other components within normal limits  BASIC METABOLIC PANEL - Abnormal; Notable for the following components:   Glucose, Bld 105 (*)    Creatinine, Ser 1.22 (*)    GFR calc non Af Amer 39 (*)    GFR calc Af Amer 45 (*)    All other components within normal limits    EKG EKG Interpretation  Date/Time:  Saturday Jun 22 2019 16:03:43 EDT Ventricular Rate:  66 PR Interval:    QRS Duration: 82 QT Interval:  430 QTC Calculation: 451 R Axis:   60 Text  Interpretation: Sinus rhythm Borderline short PR interval No significant change since last tracing Confirmed by Dorie Rank [phone]) on 06/22/2019 4:05:37 PM   Radiology DG Ankle Complete Right  Result Date: 06/22/2019 CLINICAL DATA:  Fall on blood thinners - Eliquis. Patient had unwitnessed fall at facility [facility]) while attempting to transfer from bed to wheelchair. EXAM: RIGHT ANKLE - COMPLETE 3+ VIEW COMPARISON:  None. FINDINGS: Osteopenia. There is a subtle linear lucency and cortical disruption in the lateral malleolus, raising the possibility of nondisplaced fracture. There is also nonspecific mild cortical irregularity at the medial malleolus. No evidence of dislocation. No focal bone lesion. There is regional soft tissue swelling. IMPRESSION: 1. Subtle linear lucency and cortical disruption in the lateral malleolus, raising the possibility of nondisplaced fracture. 2. There is also nonspecific cortical irregularity at the medial malleolus. Evaluation is somewhat limited by osteopenia. Consider CT of the right ankle. Electronically Signed   By: Audie Pinto M.D.   On: 06/22/2019 16:49   CT Head Wo Contrast  Result Date: 06/22/2019 CLINICAL DATA:  86 year old female with head trauma. EXAM: CT HEAD WITHOUT CONTRAST CT CERVICAL SPINE WITHOUT CONTRAST TECHNIQUE: Multidetector CT imaging of the head and cervical spine was performed following the standard protocol without intravenous contrast. Multiplanar CT image reconstructions of the cervical spine were also generated. COMPARISON:  Head CT dated 06/15/2019. FINDINGS: CT HEAD FINDINGS Brain: Advanced age-related atrophy and chronic microvascular ischemic changes. There is no acute intracranial hemorrhage. No mass effect or midline shift. No extra-axial fluid collection. Vascular: No hyperdense vessel or unexpected calcification. Skull: Normal. Negative for fracture or focal lesion. Sinuses/Orbits: There is opacification of several  ethmoid air  cells. No air-fluid level. The mastoid air cells are clear. Other: None CT CERVICAL SPINE FINDINGS Alignment: No acute subluxation. Skull base and vertebrae: Osteopenia. No acute fracture. Soft tissues and spinal canal: No prevertebral fluid or swelling. No visible canal hematoma. Disc levels: Degenerative changes and grade 1 C5-C6 anterolisthesis. Upper chest: Negative. Other: Bilateral carotid bulb calcified plaques. IMPRESSION: 1. No acute intracranial hemorrhage. 2. No acute/traumatic cervical spine pathology. Electronically Signed   By: Anner Crete M.D.   On: 06/22/2019 17:03   CT Cervical Spine Wo Contrast  Result Date: 06/22/2019 CLINICAL DATA:  86 year old female with head trauma. EXAM: CT HEAD WITHOUT CONTRAST CT CERVICAL SPINE WITHOUT CONTRAST TECHNIQUE: Multidetector CT imaging of the head and cervical spine was performed following the standard protocol without intravenous contrast. Multiplanar CT image reconstructions of the cervical spine were also generated. COMPARISON:  Head CT dated 06/15/2019. FINDINGS: CT HEAD FINDINGS Brain: Advanced age-related atrophy and chronic microvascular ischemic changes. There is no acute intracranial hemorrhage. No mass effect or midline shift. No extra-axial fluid collection. Vascular: No hyperdense vessel or unexpected calcification. Skull: Normal. Negative for fracture or focal lesion. Sinuses/Orbits: There is opacification of several ethmoid air cells. No air-fluid level. The mastoid air cells are clear. Other: None CT CERVICAL SPINE FINDINGS Alignment: No acute subluxation. Skull base and vertebrae: Osteopenia. No acute fracture. Soft tissues and spinal canal: No prevertebral fluid or swelling. No visible canal hematoma. Disc levels: Degenerative changes and grade 1 C5-C6 anterolisthesis. Upper chest: Negative. Other: Bilateral carotid bulb calcified plaques. IMPRESSION: 1. No acute intracranial hemorrhage. 2. No acute/traumatic cervical spine  pathology. Electronically Signed   By: Anner Crete M.D.   On: 06/22/2019 17:03   CT Ankle Right Wo Contrast  Result Date: 06/22/2019 CLINICAL DATA:  Pain EXAM: CT OF THE RIGHT ANKLE WITHOUT CONTRAST TECHNIQUE: Multidetector CT imaging of the right ankle was performed according to the standard protocol. Multiplanar CT image reconstructions were also generated. COMPARISON:  None. FINDINGS: Bones/Joint/Cartilage There is an acute nondisplaced transverse fracture through the distal aspect of the lateral malleolus. There is no acute displaced fracture involving the medial malleolus. No acute displaced fracture involving the posterior malleolus. There is diffuse osteopenia. Ligaments Suboptimally assessed by CT. Muscles and Tendons No acute intramuscular hematoma identified. Soft tissues There is soft tissue swelling about the ankle, most notably about the lateral malleolus. IMPRESSION: Acute nondisplaced transverse fracture through the distal aspect of the lateral malleolus. Electronically Signed   By: Constance Holster M.D.   On: 06/22/2019 18:45    Procedures Procedures (including critical care time)  Medications Ordered in ED Medications - No data to display  ED Course  I have reviewed the triage vital signs and the nursing notes.  Pertinent labs & imaging results that were available during my care of the patient were reviewed by me and considered in my medical decision making (see chart for details).  Clinical Course as of Jun 21 1908  Sat Jun 22, 2019  1713 Imaging tests reviewed.  Head CT and C-spine CT unremarkable ankle suggest possibility of occult fracture.  We will proceed with CT scan.  Laboratory tests are unremarkable.   [JK]    Clinical Course User Index [JK] Dorie Rank, MD   MDM Rules/Calculators/A&P                      Patient presented to ED for evaluation after mechanical fall.  Patient's laboratory tests are reassuring.  Head CT and C-spine  CT did not show any  significant injuries.  Plain film suggest the possibility of an ankle fracture so CT scan was performed.  It does show a lateral malleolar fracture.  I have ordered a cam walker.  Patient may bear weight.  Recommend outpatient follow-up with orthopedics. Final Clinical Impression(s) / ED Diagnoses Final diagnoses:  Closed fracture of right ankle, initial encounter    Rx / DC Orders ED Discharge Orders    None       Dorie Rank, MD 06/22/19 1910

## 2019-06-22 NOTE — Progress Notes (Signed)
Orthopedic Tech Progress Note Patient Details:  Dana Bush 11-Jan-1927 [id number]  Ortho Devices Type of Ortho Device: CAM walker Ortho Device/Splint Location: RLE Ortho Device/Splint Interventions: Ordered, Application   Post Interventions Patient Tolerated: Well Instructions Provided: Adjustment of device, Care of device   [name] 06/22/2019, 7:48 PM

## 2019-06-22 NOTE — Progress Notes (Signed)
Orthopedic Tech Progress Note Patient Details:  Dana Bush 09-21-1926 [id number] Level 2 trauma. Provider explained will get xrays and call ortho if needed. Patient ID: Dana Bush, female   DOB: 1926/10/07, 86 y.o.   MRN: [id number]   Ellouise Newer 06/22/2019, 3:53 PM

## 2019-07-02 ENCOUNTER — Ambulatory Visit: Payer: Medicare Other | Admitting: Family

## 2019-07-09 ENCOUNTER — Other Ambulatory Visit: Payer: Self-pay

## 2019-07-09 ENCOUNTER — Ambulatory Visit (INDEPENDENT_AMBULATORY_CARE_PROVIDER_SITE_OTHER): Payer: Medicare Other | Admitting: Family

## 2019-07-09 ENCOUNTER — Encounter: Payer: Self-pay | Admitting: Family

## 2019-07-09 VITALS — Ht 64.0 in | Wt 115.0 lb

## 2019-07-09 DIAGNOSIS — S82891A Other fracture of right lower leg, initial encounter for closed fracture: Secondary | ICD-10-CM | POA: Diagnosis not present

## 2019-07-09 NOTE — Progress Notes (Signed)
Office Visit Note   Patient: Dana Bush           Date of Birth: March 19, 1926           MRN: [id number] Visit Date: 07/09/2019              Requested by: Deland Pretty, MD [address] [place],  [place] [address] PCP: Deland Pretty, MD  Chief Complaint  Patient presents with  . Right Ankle - Fracture, New Patient (Initial Visit)      HPI: The patient is a 86 year old woman seen today in follow-up from the emergency department.  Initial injury on May 29.  Unfortunately she did have a fall from her bed this was unwitnessed.  She resides at [facility].  She has issues with dementia.  She does not recall her injury.  She does report complain of some ankle pain this is diffuse poorly localized.  During her emergency department stay she had radiographs as well as a CT scan of her right ankle.  Assessment & Plan: Visit Diagnoses: No diagnosis found.  Plan: Patient will continue with conservative measures nonoperative treatment of her right lateral malleolar fracture.  She will continue her cam walker.  She may weight-bear as tolerated in the cam walker.  Radiographs of the right ankle at follow-up.  Follow-Up Instructions: No follow-ups on file.   Right Ankle Exam   Tenderness  The patient is experiencing tenderness in the lateral malleolus and medial malleolus. Swelling: mild  Tests  Anterior drawer: negative  Other  Erythema: absent Sensation: normal Pulse: present       Patient is alert, oriented, no adenopathy, well-dressed, normal affect, normal respiratory effort.   Imaging: No results found. No images are attached to the encounter.  Labs: Lab Results  Component Value Date   REPTSTATUS 06/17/2019 FINAL 06/15/2019   CULT MULTIPLE SPECIES PRESENT, SUGGEST RECOLLECTION (A) 06/15/2019   LABORGA ESCHERICHIA COLI (A) 04/13/2017     Lab Results  Component Value Date   ALBUMIN 3.4 (L) 05/19/2019   ALBUMIN 3.2 (L) 02/28/2019   ALBUMIN  2.5 (L) 04/16/2017   PREALBUMIN 14.0 (L) 01/09/2011    Lab Results  Component Value Date   MG 2.1 04/15/2017   MG 1.7 01/08/2011   No results found for: VD25OH  Lab Results  Component Value Date   PREALBUMIN 14.0 (L) 01/09/2011   CBC EXTENDED Latest Ref Rng & Units 06/22/2019 06/15/2019 05/19/2019  WBC 4.0 - 10.5 K/uL 8.1 5.1 4.5  RBC 3.87 - 5.11 MIL/uL 3.84(L) 3.79(L) 3.80(L)  HGB 12.0 - 15.0 g/dL 12.0 11.9(L) 11.8(L)  HCT 36 - 46 % 37.3 37.2 36.3  PLT 150 - 400 K/uL 275 224 233  NEUTROABS 1.7 - 7.7 K/uL - 2.9 2.4  LYMPHSABS 0.7 - 4.0 K/uL - 1.4 1.4     Body mass index is 19.74 kg/m.  Orders:  No orders of the defined types were placed in this encounter.  No orders of the defined types were placed in this encounter.    Procedures: No procedures performed  Clinical Data: No additional findings.  ROS:  All other systems negative, except as noted in the HPI. Review of Systems  Constitutional: Negative for chills and fever.  Musculoskeletal: Positive for arthralgias, gait problem and joint swelling.    Objective: Vital Signs: Ht 5\' 4"  (1.626 m)   Wt 115 lb (52.2 kg)   BMI 19.74 kg/m   Specialty Comments:  No specialty comments available.  PMFS History:  Patient Active Problem List   Diagnosis Date Noted  . Acute pulmonary embolism ([place]) 02/28/2019  . ARF (acute renal failure) ([place]) 02/28/2019  . Influenza A 04/14/2017  . Dementia ([place]) 04/14/2017  . Hyperglycemia 04/14/2017  . Malnutrition of moderate degree 04/14/2017  . Sepsis due to pneumonia ([place]) 04/13/2017  . Chest pain 06/17/2016  . Left leg swelling 06/17/2016  . Drug overdose, accidental or unintentional, initial encounter 06/17/2016  . Malnutrition of mild degree ([place]) 01/09/2011  . Dizziness 01/09/2011  . Compression fracture of L1 lumbar vertebra ([place]) 01/05/2011  . Fall 01/05/2011  . Hyponatremia 01/05/2011  . Osteoporosis 01/05/2011   Past Medical History:  Diagnosis Date  .  Anxiety   . Compression fracture of L2 lumbar vertebra (HCC)   . Dementia ([place])   . Depression   . Hyperlipemia   . IgG monoclonal gammopathy   . Osteoporosis   . Peptic ulcer disease     Family History  Family history unknown: Yes    Past Surgical History:  Procedure Laterality Date  . cervical polyp removal    . DILATION AND CURETTAGE OF UTERUS    . TONSILLECTOMY     Social History   Occupational History  . Not on file  Tobacco Use  . Smoking status: Former Smoker    Packs/day: 1.00  . Smokeless tobacco: Never Used  Vaping Use  . Vaping Use: Never assessed  Substance and Sexual Activity  . Alcohol use: No  . Drug use: No  . Sexual activity: Never

## 2019-07-30 ENCOUNTER — Encounter: Payer: Self-pay | Admitting: Family

## 2019-07-30 ENCOUNTER — Ambulatory Visit: Payer: Self-pay

## 2019-07-30 ENCOUNTER — Ambulatory Visit (INDEPENDENT_AMBULATORY_CARE_PROVIDER_SITE_OTHER): Payer: Medicare Other | Admitting: Family

## 2019-07-30 ENCOUNTER — Other Ambulatory Visit: Payer: Self-pay

## 2019-07-30 VITALS — Ht 64.0 in | Wt 115.0 lb

## 2019-07-30 DIAGNOSIS — S82891A Other fracture of right lower leg, initial encounter for closed fracture: Secondary | ICD-10-CM | POA: Diagnosis not present

## 2019-07-30 NOTE — Progress Notes (Signed)
Office Visit Note   Patient: Dana Bush           Date of Birth: 1926-10-06           MRN: [id number] Visit Date: 07/30/2019              Requested by: Deland Pretty, MD [address] [place],  [place] [address] PCP: Deland Pretty, MD  Chief Complaint  Patient presents with  . Right Ankle - Fracture, Follow-up      HPI: The patient is a 86 year old woman seen today in follow-up today for right lateral malleolus fracture. Is now nearly 6 weeks out. Continues with swelling and tenderness. States is walking in cam. Complains of some pain with ambulation.  Initial injury on May 29.  Unfortunately she did have a fall from her bed this was unwitnessed.  She resides at [facility].  She has issues with dementia.  She does not recall her injury.    Assessment & Plan: Visit Diagnoses:  1. Closed fracture of right ankle, initial encounter     Plan: Patient will continue with her cam walker.  She may weight-bear as tolerated in the cam walker.   Follow-Up Instructions: No follow-ups on file.   Right Ankle Exam   Tenderness  The patient is experiencing tenderness in the lateral malleolus and medial malleolus. Swelling: mild  Tests  Anterior drawer: negative  Other  Erythema: absent Sensation: normal Pulse: present       Patient is alert, oriented, no adenopathy, well-dressed, normal affect, normal respiratory effort.   Imaging: No results found. No images are attached to the encounter.  Labs: Lab Results  Component Value Date   REPTSTATUS 06/17/2019 FINAL 06/15/2019   CULT MULTIPLE SPECIES PRESENT, SUGGEST RECOLLECTION (A) 06/15/2019   LABORGA ESCHERICHIA COLI (A) 04/13/2017     Lab Results  Component Value Date   ALBUMIN 3.4 (L) 05/19/2019   ALBUMIN 3.2 (L) 02/28/2019   ALBUMIN 2.5 (L) 04/16/2017   PREALBUMIN 14.0 (L) 01/09/2011    Lab Results  Component Value Date   MG 2.1 04/15/2017   MG 1.7 01/08/2011   No results found for:  VD25OH  Lab Results  Component Value Date   PREALBUMIN 14.0 (L) 01/09/2011   CBC EXTENDED Latest Ref Rng & Units 06/22/2019 06/15/2019 05/19/2019  WBC 4.0 - 10.5 K/uL 8.1 5.1 4.5  RBC 3.87 - 5.11 MIL/uL 3.84(L) 3.79(L) 3.80(L)  HGB 12.0 - 15.0 g/dL 12.0 11.9(L) 11.8(L)  HCT 36 - 46 % 37.3 37.2 36.3  PLT 150 - 400 K/uL 275 224 233  NEUTROABS 1.7 - 7.7 K/uL - 2.9 2.4  LYMPHSABS 0.7 - 4.0 K/uL - 1.4 1.4     Body mass index is 19.74 kg/m.  Orders:  Orders Placed This Encounter  Procedures  . XR Ankle Complete Right   No orders of the defined types were placed in this encounter.    Procedures: No procedures performed  Clinical Data: No additional findings.  ROS:  All other systems negative, except as noted in the HPI. Review of Systems  Constitutional: Negative for chills and fever.  Musculoskeletal: Positive for arthralgias, gait problem and joint swelling.    Objective: Vital Signs: Ht 5\' 4"  (1.626 m)   Wt 115 lb (52.2 kg)   BMI 19.74 kg/m   Specialty Comments:  No specialty comments available.  PMFS History: Patient Active Problem List   Diagnosis Date Noted  . Acute pulmonary embolism ([place]) 02/28/2019  . ARF (acute  renal failure) ([place]) 02/28/2019  . Influenza A 04/14/2017  . Dementia ([place]) 04/14/2017  . Hyperglycemia 04/14/2017  . Malnutrition of moderate degree 04/14/2017  . Sepsis due to pneumonia ([place]) 04/13/2017  . Chest pain 06/17/2016  . Left leg swelling 06/17/2016  . Drug overdose, accidental or unintentional, initial encounter 06/17/2016  . Malnutrition of mild degree ([place]) 01/09/2011  . Dizziness 01/09/2011  . Compression fracture of L1 lumbar vertebra ([place]) 01/05/2011  . Fall 01/05/2011  . Hyponatremia 01/05/2011  . Osteoporosis 01/05/2011   Past Medical History:  Diagnosis Date  . Anxiety   . Compression fracture of L2 lumbar vertebra (HCC)   . Dementia ([place])   . Depression   . Hyperlipemia   . IgG monoclonal gammopathy   .  Osteoporosis   . Peptic ulcer disease     Family History  Family history unknown: Yes    Past Surgical History:  Procedure Laterality Date  . cervical polyp removal    . DILATION AND CURETTAGE OF UTERUS    . TONSILLECTOMY     Social History   Occupational History  . Not on file  Tobacco Use  . Smoking status: Former Smoker    Packs/day: 1.00  . Smokeless tobacco: Never Used  Vaping Use  . Vaping Use: Never assessed  Substance and Sexual Activity  . Alcohol use: No  . Drug use: No  . Sexual activity: Never

## 2019-08-20 ENCOUNTER — Encounter: Payer: Self-pay | Admitting: Physician Assistant

## 2019-08-20 ENCOUNTER — Ambulatory Visit (INDEPENDENT_AMBULATORY_CARE_PROVIDER_SITE_OTHER): Payer: Medicare Other | Admitting: Physician Assistant

## 2019-08-20 VITALS — Ht 64.0 in | Wt 115.0 lb

## 2019-08-20 DIAGNOSIS — S82891A Other fracture of right lower leg, initial encounter for closed fracture: Secondary | ICD-10-CM

## 2019-08-20 NOTE — Progress Notes (Signed)
Office Visit Note   Patient: Dana Bush           Date of Birth: 1926/08/25           MRN: [id number] Visit Date: 08/20/2019              Requested by: Deland Pretty, MD [address] [place],  [place] [address] PCP: Deland Pretty, MD  Chief Complaint  Patient presents with  . Right Ankle - Follow-up      HPI: Patient presents today 2 months status post right lateral malleolus fracture.  She has at a nursing facility.  She does have a history of dementia.  Overall she says she is doing well and has been walking with a walker in her boot  Assessment & Plan: Visit Diagnoses: No diagnosis found.  Plan: Patient may follow-up as needed she should do physical therapy at her nursing facility for fall prevention and gait training  Follow-Up Instructions: No follow-ups on file.   Ortho Exam  Patient is alert, oriented, no adenopathy, well-dressed, normal affect, normal respiratory effort. Focused exam she does have some mild stiffness with ankle range of motion but no pain.  No erythema no swelling mildly tender to deep palpation over the lateral malleolus pulses are intact  Imaging: No results found. No images are attached to the encounter.  Labs: Lab Results  Component Value Date   REPTSTATUS 06/17/2019 FINAL 06/15/2019   CULT MULTIPLE SPECIES PRESENT, SUGGEST RECOLLECTION (A) 06/15/2019   LABORGA ESCHERICHIA COLI (A) 04/13/2017     Lab Results  Component Value Date   ALBUMIN 3.4 (L) 05/19/2019   ALBUMIN 3.2 (L) 02/28/2019   ALBUMIN 2.5 (L) 04/16/2017   PREALBUMIN 14.0 (L) 01/09/2011    Lab Results  Component Value Date   MG 2.1 04/15/2017   MG 1.7 01/08/2011   No results found for: VD25OH  Lab Results  Component Value Date   PREALBUMIN 14.0 (L) 01/09/2011   CBC EXTENDED Latest Ref Rng & Units 06/22/2019 06/15/2019 05/19/2019  WBC 4.0 - 10.5 K/uL 8.1 5.1 4.5  RBC 3.87 - 5.11 MIL/uL 3.84(L) 3.79(L) 3.80(L)  HGB 12.0 - 15.0 g/dL 12.0  11.9(L) 11.8(L)  HCT 36 - 46 % 37.3 37.2 36.3  PLT 150 - 400 K/uL 275 224 233  NEUTROABS 1.7 - 7.7 K/uL - 2.9 2.4  LYMPHSABS 0.7 - 4.0 K/uL - 1.4 1.4     Body mass index is 19.74 kg/m.  Orders:  No orders of the defined types were placed in this encounter.  No orders of the defined types were placed in this encounter.    Procedures: No procedures performed  Clinical Data: No additional findings.  ROS:  All other systems negative, except as noted in the HPI. Review of Systems  Objective: Vital Signs: Ht 5\' 4"  (1.626 m)   Wt 115 lb (52.2 kg)   BMI 19.74 kg/m   Specialty Comments:  No specialty comments available.  PMFS History: Patient Active Problem List   Diagnosis Date Noted  . Acute pulmonary embolism ([place]) 02/28/2019  . ARF (acute renal failure) ([place]) 02/28/2019  . Influenza A 04/14/2017  . Dementia ([place]) 04/14/2017  . Hyperglycemia 04/14/2017  . Malnutrition of moderate degree 04/14/2017  . Sepsis due to pneumonia ([place]) 04/13/2017  . Chest pain 06/17/2016  . Left leg swelling 06/17/2016  . Drug overdose, accidental or unintentional, initial encounter 06/17/2016  . Malnutrition of mild degree ([place]) 01/09/2011  . Dizziness 01/09/2011  . Compression  fracture of L1 lumbar vertebra ([place]) 01/05/2011  . Fall 01/05/2011  . Hyponatremia 01/05/2011  . Osteoporosis 01/05/2011   Past Medical History:  Diagnosis Date  . Anxiety   . Compression fracture of L2 lumbar vertebra (HCC)   . Dementia ([place])   . Depression   . Hyperlipemia   . IgG monoclonal gammopathy   . Osteoporosis   . Peptic ulcer disease     Family History  Family history unknown: Yes    Past Surgical History:  Procedure Laterality Date  . cervical polyp removal    . DILATION AND CURETTAGE OF UTERUS    . TONSILLECTOMY     Social History   Occupational History  . Not on file  Tobacco Use  . Smoking status: Former Smoker    Packs/day: 1.00  . Smokeless tobacco: Never Used  Vaping  Use  . Vaping Use: Never assessed  Substance and Sexual Activity  . Alcohol use: No  . Drug use: No  . Sexual activity: Never

## 2020-08-02 ENCOUNTER — Other Ambulatory Visit: Payer: Self-pay

## 2020-08-02 ENCOUNTER — Inpatient Hospital Stay (HOSPITAL_COMMUNITY)
Admission: EM | Admit: 2020-08-02 | Discharge: 2020-08-06 | DRG: 193 | Disposition: A | Discharge status: Home Health | Payer: Medicare Other | Attending: Internal Medicine | Admitting: Internal Medicine

## 2020-08-02 ENCOUNTER — Emergency Department (HOSPITAL_COMMUNITY): Payer: Medicare Other

## 2020-08-02 DIAGNOSIS — I2699 Other pulmonary embolism without acute cor pulmonale: Secondary | ICD-10-CM | POA: Diagnosis present

## 2020-08-02 DIAGNOSIS — R32 Unspecified urinary incontinence: Secondary | ICD-10-CM | POA: Diagnosis present

## 2020-08-02 DIAGNOSIS — R0981 Nasal congestion: Secondary | ICD-10-CM | POA: Diagnosis present

## 2020-08-02 DIAGNOSIS — I82409 Acute embolism and thrombosis of unspecified deep veins of unspecified lower extremity: Secondary | ICD-10-CM | POA: Diagnosis present

## 2020-08-02 DIAGNOSIS — Z8711 Personal history of peptic ulcer disease: Secondary | ICD-10-CM

## 2020-08-02 DIAGNOSIS — Z86711 Personal history of pulmonary embolism: Secondary | ICD-10-CM | POA: Diagnosis not present

## 2020-08-02 DIAGNOSIS — N1832 Chronic kidney disease, stage 3b: Secondary | ICD-10-CM | POA: Diagnosis present

## 2020-08-02 DIAGNOSIS — I825Y9 Chronic embolism and thrombosis of unspecified deep veins of unspecified proximal lower extremity: Secondary | ICD-10-CM | POA: Diagnosis not present

## 2020-08-02 DIAGNOSIS — Z7983 Long term (current) use of bisphosphonates: Secondary | ICD-10-CM

## 2020-08-02 DIAGNOSIS — Z87891 Personal history of nicotine dependence: Secondary | ICD-10-CM | POA: Diagnosis not present

## 2020-08-02 DIAGNOSIS — M810 Age-related osteoporosis without current pathological fracture: Secondary | ICD-10-CM | POA: Diagnosis present

## 2020-08-02 DIAGNOSIS — J189 Pneumonia, unspecified organism: Principal | ICD-10-CM | POA: Diagnosis present

## 2020-08-02 DIAGNOSIS — G9340 Encephalopathy, unspecified: Secondary | ICD-10-CM | POA: Diagnosis present

## 2020-08-02 DIAGNOSIS — E785 Hyperlipidemia, unspecified: Secondary | ICD-10-CM | POA: Diagnosis present

## 2020-08-02 DIAGNOSIS — Z7901 Long term (current) use of anticoagulants: Secondary | ICD-10-CM | POA: Diagnosis not present

## 2020-08-02 DIAGNOSIS — F0390 Unspecified dementia without behavioral disturbance: Secondary | ICD-10-CM | POA: Diagnosis present

## 2020-08-02 DIAGNOSIS — Z20822 Contact with and (suspected) exposure to covid-19: Secondary | ICD-10-CM | POA: Diagnosis present

## 2020-08-02 DIAGNOSIS — Z66 Do not resuscitate: Secondary | ICD-10-CM | POA: Diagnosis present

## 2020-08-02 DIAGNOSIS — Z79899 Other long term (current) drug therapy: Secondary | ICD-10-CM

## 2020-08-02 DIAGNOSIS — G9341 Metabolic encephalopathy: Secondary | ICD-10-CM | POA: Diagnosis present

## 2020-08-02 DIAGNOSIS — N183 Chronic kidney disease, stage 3 unspecified: Secondary | ICD-10-CM | POA: Diagnosis present

## 2020-08-02 DIAGNOSIS — Z86718 Personal history of other venous thrombosis and embolism: Secondary | ICD-10-CM

## 2020-08-02 DIAGNOSIS — F32A Depression, unspecified: Secondary | ICD-10-CM | POA: Diagnosis present

## 2020-08-02 DIAGNOSIS — E860 Dehydration: Secondary | ICD-10-CM | POA: Diagnosis present

## 2020-08-02 LAB — URINALYSIS, ROUTINE W REFLEX MICROSCOPIC
Bilirubin Urine: NEGATIVE
Glucose, UA: NEGATIVE mg/dL
Ketones, ur: NEGATIVE mg/dL
Nitrite: NEGATIVE
Protein, ur: 30 mg/dL — AB
Specific Gravity, Urine: 1.014 (ref 1.005–1.030)
pH: 6 (ref 5.0–8.0)

## 2020-08-02 LAB — CBC WITH DIFFERENTIAL/PLATELET
Abs Immature Granulocytes: 0.03 10*3/uL (ref 0.00–0.07)
Basophils Absolute: 0 10*3/uL (ref 0.0–0.1)
Basophils Relative: 0 %
Eosinophils Absolute: 0 10*3/uL (ref 0.0–0.5)
Eosinophils Relative: 0 %
HCT: 37.5 % (ref 36.0–46.0)
Hemoglobin: 12.4 g/dL (ref 12.0–15.0)
Immature Granulocytes: 0 %
Lymphocytes Relative: 12 %
Lymphs Abs: 1.2 10*3/uL (ref 0.7–4.0)
MCH: 32.4 pg (ref 26.0–34.0)
MCHC: 33.1 g/dL (ref 30.0–36.0)
MCV: 97.9 fL (ref 80.0–100.0)
Monocytes Absolute: 1.1 10*3/uL — ABNORMAL HIGH (ref 0.1–1.0)
Monocytes Relative: 11 %
Neutro Abs: 7.5 10*3/uL (ref 1.7–7.7)
Neutrophils Relative %: 77 %
Platelets: 185 10*3/uL (ref 150–400)
RBC: 3.83 MIL/uL — ABNORMAL LOW (ref 3.87–5.11)
RDW: 13.2 % (ref 11.5–15.5)
WBC: 9.8 10*3/uL (ref 4.0–10.5)
nRBC: 0 % (ref 0.0–0.2)

## 2020-08-02 LAB — COMPREHENSIVE METABOLIC PANEL
ALT: 9 U/L (ref 0–44)
AST: 15 U/L (ref 15–41)
Albumin: 3.6 g/dL (ref 3.5–5.0)
Alkaline Phosphatase: 41 U/L (ref 38–126)
Anion gap: 8 (ref 5–15)
BUN: 15 mg/dL (ref 8–23)
CO2: 26 mmol/L (ref 22–32)
Calcium: 9.6 mg/dL (ref 8.9–10.3)
Chloride: 103 mmol/L (ref 98–111)
Creatinine, Ser: 1.16 mg/dL — ABNORMAL HIGH (ref 0.44–1.00)
GFR, Estimated: 44 mL/min — ABNORMAL LOW (ref >60)
Glucose, Bld: 120 mg/dL — ABNORMAL HIGH (ref 70–99)
Potassium: 4.1 mmol/L (ref 3.5–5.1)
Sodium: 137 mmol/L (ref 135–145)
Total Bilirubin: 0.9 mg/dL (ref 0.3–1.2)
Total Protein: 8 g/dL (ref 6.5–8.1)

## 2020-08-02 LAB — RESP PANEL BY RT-PCR (FLU A&B, COVID) ARPGX2
Influenza A by PCR: NEGATIVE
Influenza B by PCR: NEGATIVE
SARS Coronavirus 2 by RT PCR: NEGATIVE

## 2020-08-02 LAB — STREP PNEUMONIAE URINARY ANTIGEN: Strep Pneumo Urinary Antigen: NEGATIVE

## 2020-08-02 LAB — CBG MONITORING, ED: Glucose-Capillary: 110 mg/dL — ABNORMAL HIGH (ref 70–99)

## 2020-08-02 MED ORDER — AZITHROMYCIN 250 MG PO TABS
500.0000 mg | ORAL_TABLET | Freq: Every day | ORAL | Status: DC
  Administered 2020-08-03 – 2020-08-06 (×4): 500 mg via ORAL
  Filled 2020-08-02 (×4): qty 2

## 2020-08-02 MED ORDER — DONEPEZIL HCL 10 MG PO TABS
10.0000 mg | ORAL_TABLET | Freq: Every day | ORAL | Status: DC
  Administered 2020-08-03 – 2020-08-05 (×3): 10 mg via ORAL
  Filled 2020-08-02 (×3): qty 1
  Filled 2020-08-02: qty 2

## 2020-08-02 MED ORDER — AZITHROMYCIN 500 MG IV SOLR
500.0000 mg | Freq: Once | INTRAVENOUS | Status: AC
  Administered 2020-08-02: 500 mg via INTRAVENOUS
  Filled 2020-08-02: qty 500

## 2020-08-02 MED ORDER — LORAZEPAM 0.5 MG PO TABS: 0.5000 mg | ORAL_TABLET | Freq: Every day | ORAL | Status: DC | PRN

## 2020-08-02 MED ORDER — FAMOTIDINE 20 MG PO TABS
20.0000 mg | ORAL_TABLET | Freq: Every day | ORAL | Status: DC
  Administered 2020-08-03 – 2020-08-05 (×3): 20 mg via ORAL
  Filled 2020-08-02 (×3): qty 1

## 2020-08-02 MED ORDER — ONDANSETRON HCL 4 MG PO TABS
4.0000 mg | ORAL_TABLET | Freq: Four times a day (QID) | ORAL | Status: DC | PRN
Start: 2020-08-02 — End: 2020-08-06

## 2020-08-02 MED ORDER — POTASSIUM CHLORIDE CRYS ER 20 MEQ PO TBCR
20.0000 meq | EXTENDED_RELEASE_TABLET | Freq: Every day | ORAL | Status: DC
  Administered 2020-08-03 – 2020-08-06 (×4): 20 meq via ORAL
  Filled 2020-08-02 (×4): qty 1

## 2020-08-02 MED ORDER — GUAIFENESIN ER 600 MG PO TB12
600.0000 mg | ORAL_TABLET | Freq: Two times a day (BID) | ORAL | Status: DC
  Administered 2020-08-03 – 2020-08-06 (×7): 600 mg via ORAL
  Filled 2020-08-02 (×7): qty 1

## 2020-08-02 MED ORDER — ACETAMINOPHEN 325 MG PO TABS: 650.0000 mg | ORAL_TABLET | Freq: Four times a day (QID) | ORAL | Status: DC | PRN

## 2020-08-02 MED ORDER — MIRTAZAPINE 7.5 MG PO TABS
7.5000 mg | ORAL_TABLET | Freq: Every day | ORAL | Status: DC
  Administered 2020-08-03 – 2020-08-05 (×3): 7.5 mg via ORAL
  Filled 2020-08-02 (×4): qty 1

## 2020-08-02 MED ORDER — APIXABAN 5 MG PO TABS
5.0000 mg | ORAL_TABLET | Freq: Two times a day (BID) | ORAL | Status: DC
  Administered 2020-08-03 – 2020-08-06 (×7): 5 mg via ORAL
  Filled 2020-08-02 (×7): qty 1

## 2020-08-02 MED ORDER — ONDANSETRON HCL 4 MG/2ML IJ SOLN: 4.0000 mg | Freq: Four times a day (QID) | INTRAMUSCULAR | Status: DC | PRN

## 2020-08-02 MED ORDER — SODIUM CHLORIDE 0.9 % IV SOLN
2.0000 g | INTRAVENOUS | Status: DC
  Administered 2020-08-03 – 2020-08-06 (×4): 2 g via INTRAVENOUS
  Filled 2020-08-02: qty 2
  Filled 2020-08-02 (×2): qty 20
  Filled 2020-08-02: qty 2

## 2020-08-02 MED ORDER — POLYETHYLENE GLYCOL 3350 17 G PO PACK
17.0000 g | PACK | Freq: Every day | ORAL | Status: DC
  Administered 2020-08-03 – 2020-08-06 (×3): 17 g via ORAL
  Filled 2020-08-02 (×4): qty 1

## 2020-08-02 MED ORDER — FERROUS SULFATE 325 (65 FE) MG PO TABS
325.0000 mg | ORAL_TABLET | Freq: Every day | ORAL | Status: DC
  Administered 2020-08-03 – 2020-08-06 (×4): 325 mg via ORAL
  Filled 2020-08-02 (×4): qty 1

## 2020-08-02 MED ORDER — SODIUM CHLORIDE 0.9 % IV SOLN
1.0000 g | Freq: Once | INTRAVENOUS | Status: AC
  Administered 2020-08-02: 1 g via INTRAVENOUS
  Filled 2020-08-02: qty 10

## 2020-08-02 MED ORDER — ALBUTEROL SULFATE (2.5 MG/3ML) 0.083% IN NEBU: 3.0000 mL | INHALATION_SOLUTION | Freq: Four times a day (QID) | RESPIRATORY_TRACT | Status: DC | PRN

## 2020-08-02 MED ORDER — CITALOPRAM HYDROBROMIDE 20 MG PO TABS
20.0000 mg | ORAL_TABLET | Freq: Every day | ORAL | Status: DC
  Administered 2020-08-03 – 2020-08-06 (×4): 20 mg via ORAL
  Filled 2020-08-02 (×3): qty 1
  Filled 2020-08-02: qty 2
  Filled 2020-08-02: qty 1

## 2020-08-02 NOTE — ED Notes (Signed)
Pt O2 dropping to 89% while sleeping. Placed on 2L [place]. SpO2 97%

## 2020-08-02 NOTE — ED Provider Notes (Signed)
[facility] DEPT Provider Note   CSN: [id number] Arrival date & time: 08/02/20  0740     History No chief complaint on file.   Dana Bush is a 87 y.o. female.  HPI Patient is a 87 year old female with a history of pulmonary embolism, dementia, depression, hyperlipidemia, IgG monoclonal gammopathy, PUD, who presents to the emergency department due to altered mental status.  Per EMS, patient resides at [facility].  Nursing staff called EMS because patient has been declining for the past 2 to 3 days and is increasingly altered.  They state that she will typically answer questions and carry on a conversation but has had more more difficulty answering basic questions.  Yesterday she stopped eating food.  They note increased nasal congestion, productive cough, as well as rhonchi.  Patient is a DNR.  Level 5 caveat due to dementia/altered mental status.    Past Medical History:  Diagnosis Date   Anxiety    Compression fracture of L2 lumbar vertebra (HCC)    Dementia (HCC)    Depression    Hyperlipemia    IgG monoclonal gammopathy    Osteoporosis    Peptic ulcer disease     Patient Active Problem List   Diagnosis Date Noted   Acute pulmonary embolism ([place]) 02/28/2019   ARF (acute renal failure) ([facility] [place]) 02/28/2019   Influenza A 04/14/2017   Dementia ([place]) 04/14/2017   Hyperglycemia 04/14/2017   Malnutrition of moderate degree 04/14/2017   Sepsis due to pneumonia ([place]) 04/13/2017   Chest pain 06/17/2016   Left leg swelling 06/17/2016   Drug overdose, accidental or unintentional, initial encounter 06/17/2016   Malnutrition of mild degree ([place]) 01/09/2011   Dizziness 01/09/2011   Compression fracture of L1 lumbar vertebra ([place]) 01/05/2011   Fall 01/05/2011   Hyponatremia 01/05/2011   Osteoporosis 01/05/2011    Past Surgical History:  Procedure Laterality Date   cervical polyp removal     DILATION AND CURETTAGE OF UTERUS     TONSILLECTOMY        OB History   No obstetric history on file.     Family History  Family history unknown: Yes    Social History   Tobacco Use   Smoking status: Former    Packs/day: 1.00    Pack years: 0.00    Types: Cigarettes   Smokeless tobacco: Never  Substance Use Topics   Alcohol use: No   Drug use: No    Home Medications Prior to Admission medications   Medication Sig Start Date End Date Taking? Authorizing Provider  acetaminophen (TYLENOL) 325 MG tablet Take 2 tablets (650 mg total) by mouth every 6 (six) hours as needed for mild pain, fever or headache (or Fever >/= 101). 03/04/19   Lucky Cowboy, MD  albuterol (PROVENTIL HFA;VENTOLIN HFA) 108 (90 Base) MCG/ACT inhaler Inhale 2 puffs into the lungs every 6 (six) hours as needed for wheezing or shortness of breath. 04/17/17   Purohit, Konrad Dolores, MD  alendronate (FOSAMAX) 70 MG tablet Take 70 mg by mouth every Sunday. Take with a full glass of water on an empty stomach.     [provider]  apixaban (ELIQUIS) 5 MG TABS tablet Take 1 tablet (5 mg total) by mouth 2 (two) times daily. 03/08/19   Lucky Cowboy, MD  Calcium Carb-Cholecalciferol 600-400 MG-UNIT TABS Take 1 tablet by mouth 2 (two) times daily.    [provider]  cephALEXin (KEFLEX) 250 MG capsule Take 1 capsule (250  mg total) by mouth 4 (four) times daily. 06/15/19   Hayden Rasmussen, MD  citalopram (CELEXA) 20 MG tablet Take 20 mg by mouth daily at 6 (six) AM.    [provider]  citalopram (CELEXA) 40 MG tablet Take 0.5 tablets (20 mg total) by mouth daily. 03/04/19   Lucky Cowboy, MD  donepezil (ARICEPT) 10 MG tablet Take 10 mg by mouth at bedtime.    [provider]  famotidine (PEPCID) 20 MG tablet Take 20 mg by mouth at bedtime. 02/01/19   [provider]  ferrous sulfate 325 (65 FE) MG tablet Take 325 mg by mouth daily at 6 (six) AM.     [provider]  LORazepam (ATIVAN) 0.5 MG tablet Take 0.5 mg by mouth daily as  needed for anxiety.  02/04/19   [provider]  mirtazapine (REMERON) 7.5 MG tablet Take 7.5 mg by mouth at bedtime. 01/28/19   [provider]  polyethylene glycol (MIRALAX / GLYCOLAX) packet Take 17 g by mouth daily at 6 (six) AM.     [provider]  potassium chloride (KLOR-CON) 10 MEQ tablet Take 2 tablets (20 mEq total) by mouth daily. 03/04/19   Lucky Cowboy, MD  potassium chloride SA (KLOR-CON) 20 MEQ tablet Take 1 tablet (20 mEq total) by mouth 2 (two) times daily. Patient taking differently: Take 20 mEq by mouth daily at 12 noon.  05/19/19   Davonna Belling, MD  Spacer/Aero Chamber Mouthpiece MISC 1 Units by Does not apply route every 4 (four) hours as needed (wheezing). 04/17/17   Purohit, Konrad Dolores, MD    Allergies    Patient has no known allergies.  Review of Systems   Review of Systems  Unable to perform ROS: Dementia   Physical Exam Updated Vital Signs BP (!) 111/59   Pulse 72   Temp 97.6 F (36.4 C) (Oral)   Resp 19   SpO2 98%   Physical Exam Vitals and nursing note reviewed.  Constitutional:      General: She is not in acute distress.    Appearance: Normal appearance. She is not ill-appearing, toxic-appearing or diaphoretic.     Comments: Well-developed elderly female.  Appears fatigued but arousable.  Does not provide clear answers to questions.  HENT:     Head: Normocephalic and atraumatic.     Right Ear: External ear normal.     Left Ear: External ear normal.     Nose: Nose normal.     Mouth/Throat:     Mouth: Mucous membranes are moist.     Pharynx: Oropharynx is clear. No oropharyngeal exudate or posterior oropharyngeal erythema.  Eyes:     General: No scleral icterus.       Right eye: No discharge.        Left eye: No discharge.     Extraocular Movements: Extraocular movements intact.     Conjunctiva/sclera: Conjunctivae normal.  Cardiovascular:     Rate and Rhythm: Normal rate and regular rhythm.     Pulses: Normal pulses.      Heart sounds: Normal heart sounds. No murmur heard.   No friction rub. No gallop.     Comments: RRR without M/R/G. Pulmonary:     Effort: Pulmonary effort is normal. No respiratory distress.     Breath sounds: No stridor. Rhonchi present. No wheezing or rales.     Comments: Mild rhonchi and faint expiratory wheezes.  Difficult to assess due to poor respiratory effort. Abdominal:  General: Abdomen is flat.     Palpations: Abdomen is soft.     Tenderness: There is no abdominal tenderness.     Comments: Abdomen is flat and soft.  Does not appear to be in pain with deep palpation of all 4 quadrants.  Musculoskeletal:        General: Normal range of motion.     Cervical back: Normal range of motion and neck supple. No tenderness.  Skin:    General: Skin is warm and dry.  Neurological:     General: No focal deficit present.     Comments: A&O x1.  Not providing clear answers to questions.  Appears fatigued.  Follows commands.  Psychiatric:        Mood and Affect: Mood normal.        Behavior: Behavior normal.    ED Results / Procedures / Treatments   Labs (all labs ordered are listed, but only abnormal results are displayed) Labs Reviewed  COMPREHENSIVE METABOLIC PANEL - Abnormal; Notable for the following components:      Result Value   Glucose, Bld 120 (*)    Creatinine, Ser 1.16 (*)    GFR, Estimated 44 (*)    All other components within normal limits  URINALYSIS, ROUTINE W REFLEX MICROSCOPIC - Abnormal; Notable for the following components:   APPearance HAZY (*)    Hgb urine dipstick MODERATE (*)    Protein, ur 30 (*)    Leukocytes,Ua TRACE (*)    Bacteria, UA FEW (*)    All other components within normal limits  CBC WITH DIFFERENTIAL/PLATELET - Abnormal; Notable for the following components:   RBC 3.83 (*)    Monocytes Absolute 1.1 (*)    All other components within normal limits  CBG MONITORING, ED - Abnormal; Notable for the following components:    Glucose-Capillary 110 (*)    All other components within normal limits  RESP PANEL BY RT-PCR (FLU A&B, COVID) [id number]  URINE CULTURE    EKG None  Radiology CT Head Wo Contrast  Result Date: 08/02/2020 CLINICAL DATA:  Altered mental status beginning yesterday. Baseline dementia. Upper respiratory symptoms 2 days. EXAM: CT HEAD WITHOUT CONTRAST TECHNIQUE: Contiguous axial images were obtained from the base of the skull through the vertex without intravenous contrast. COMPARISON:  06/22/2019 FINDINGS: Brain: Examination demonstrates ventricles and cisterns to be within normal. There is mild age related atrophy unchanged. There is chronic ischemic microvascular disease. There is no mass, mass effect, shift of midline structures or acute hemorrhage. No evidence of acute infarction. Vascular: No hyperdense vessel or unexpected calcification. Skull: Normal. Negative for fracture or focal lesion. Sinuses/Orbits: No acute finding. Other: None. IMPRESSION: 1. No acute findings. 2. Chronic ischemic microvascular disease and age related atrophy. Electronically Signed   By: Marin Olp M.D.   On: 08/02/2020 08:27   DG Chest Portable 1 View  Result Date: 08/02/2020 CLINICAL DATA:  Altered mental status starting yesterday. Cough and congestion 2 days. EXAM: PORTABLE CHEST 1 VIEW COMPARISON:  05/19/2019, 02/28/2019 FINDINGS: Patient is rotated to the right. Mild interval worsening of airspace opacification over the right upper lobe likely due to infection. Stable mild hazy density over the lateral right base. Linear atelectasis left base. No effusion. Cardiomediastinal silhouette and remainder of the exam is unchanged. IMPRESSION: Interval worsening airspace opacification over the right upper lobe likely due to infection. Stable hazy density lateral right base. Linear atelectasis left base. Electronically Signed   By: Marin Olp M.D.  On: 08/02/2020 08:23    Procedures Procedures   Medications Ordered in  ED Medications  azithromycin (ZITHROMAX) 500 mg in sodium chloride 0.9 % 250 mL IVPB (500 mg Intravenous New Bag/Given 08/02/20 0932)  cefTRIAXone (ROCEPHIN) 1 g in sodium chloride 0.9 % 100 mL IVPB (0 g Intravenous Stopped 08/02/20 0957)   ED Course  I have reviewed the triage vital signs and the nursing notes.  Pertinent labs & imaging results that were available during my care of the patient were reviewed by me and considered in my medical decision making (see chart for details).    MDM Rules/Calculators/A&P                          Pt is a 87 y.o. female who presents to the ED due to AMS/failure to thrive.  Labs: CBC with RBCs of 3.83. CMP with a glucose of 120 and a creatinine of 1.16.  GFR 44. Respiratory panel is negative. UA shows moderate hemoglobin, 30 protein, trace leukocytes, few bacteria.  Imaging: Chest x-ray shows interval worsening airspace opacification over the right upper lobe likely due to infection.  Stable hazy density lateral right base.  Linear atelectasis in the left base. CT scan of the head without contrast shows no acute findings.  Chronic ischemic microvascular disease and age-related atrophy.  I, Rayna Sexton, PA-C, personally reviewed and evaluated these images and lab results as part of my medical decision-making.  Chest x-ray concerning for a developing right-sided pneumonia.  Oxygen saturations in the high 80s to low 90s on room air and patient was started on 2 L via nasal cannula.  Lab work generally reassuring.  Kidney function appears to be similar to patient's baseline.  Respiratory panel is negative.  Patient started on azithromycin as well as Rocephin.  Given her age, comorbidities, and mild hypoxia, feel that patient will require admission for further management.  We will discuss with the medicine team.  Note: Portions of this report may have been transcribed using voice recognition software. Every effort was made to ensure accuracy; however,  inadvertent computerized transcription errors may be present.   Final Clinical Impression(s) / ED Diagnoses Final diagnoses:  Encephalopathy  Pneumonia of right lung due to infectious organism, unspecified part of lung   Rx / DC Orders ED Discharge Orders     None        Rayna Sexton, PA-C 08/02/20 1023    Milton Ferguson, MD 08/02/20 [phone]

## 2020-08-02 NOTE — H&P (Signed)
History and Physical    [name]:[id number] DOB: 1926-06-09 DOA: 08/02/2020  PCP: Deland Pretty, MD  Patient coming from: [facility]  Chief Complaint: Confusion, poor appetite  HPI: Dana Bush is a 87 y.o. female with medical history significant of dementia,  previous DVT/PE on anticoagulation, depression. Presenting with confusion and poor appetite. History is from her daughter. The patient is a resident at [facility]. The dtr states that her brother received a call from the facility this morning notifying him that his mother would be transferred to the ED for evaluation. She had poor PO intake over the last several days. She had become weak and confused. This morning that found her to be incontinent of urine. They were concerned she may have a UTI.   ED Course: Found to have a RUL PNA. Started on rocephin/zithro. TRH was called for admission.   Review of Systems:  Denies CP, N/V/D, dyspnea. Reports cough. Review of systems is otherwise negative for all not mentioned in HPI.   PMHx Past Medical History:  Diagnosis Date   Anxiety    Compression fracture of L2 lumbar vertebra (HCC)    Dementia (HCC)    Depression    Hyperlipemia    IgG monoclonal gammopathy    Osteoporosis    Peptic ulcer disease     PSHx Past Surgical History:  Procedure Laterality Date   cervical polyp removal     DILATION AND CURETTAGE OF UTERUS     TONSILLECTOMY      SocHx  reports that she has quit smoking. She smoked an average of 1.00 packs per day. She has never used smokeless tobacco. She reports that she does not drink alcohol and does not use drugs.  No Known Allergies  FamHx Family History  Family history unknown: Yes    Prior to Admission medications   Medication Sig Start Date End Date Taking? Authorizing Provider  acetaminophen (TYLENOL) 325 MG tablet Take 2 tablets (650 mg total) by mouth every 6 (six) hours as needed for mild pain, fever or headache (or Fever >/=  101). 03/04/19  Yes Lucky Cowboy, MD  albuterol (PROVENTIL HFA;VENTOLIN HFA) 108 (90 Base) MCG/ACT inhaler Inhale 2 puffs into the lungs every 6 (six) hours as needed for wheezing or shortness of breath. 04/17/17  Yes Purohit, Konrad Dolores, MD  alendronate (FOSAMAX) 70 MG tablet Take 70 mg by mouth every Sunday. Take with a full glass of water on an empty stomach.    Yes [provider]  apixaban (ELIQUIS) 5 MG TABS tablet Take 1 tablet (5 mg total) by mouth 2 (two) times daily. 03/08/19  Yes Lucky Cowboy, MD  Calcium Carb-Cholecalciferol 600-400 MG-UNIT TABS Take 1 tablet by mouth 2 (two) times daily.   Yes [provider]  citalopram (CELEXA) 20 MG tablet Take 20 mg by mouth daily at 6 (six) AM.   Yes [provider]  citalopram (CELEXA) 40 MG tablet Take 0.5 tablets (20 mg total) by mouth daily. 03/04/19  Yes Lucky Cowboy, MD  donepezil (ARICEPT) 10 MG tablet Take 10 mg by mouth at bedtime.   Yes [provider]  famotidine (PEPCID) 20 MG tablet Take 20 mg by mouth at bedtime. 02/01/19  Yes [provider]  ferrous sulfate 325 (65 FE) MG tablet Take 325 mg by mouth daily at 6 (six) AM.    Yes [provider]  LORazepam (ATIVAN) 0.5 MG tablet Take 0.5 mg by mouth daily as needed  for anxiety.  02/04/19  Yes [provider]  mirtazapine (REMERON) 7.5 MG tablet Take 7.5 mg by mouth at bedtime. 01/28/19  Yes [provider]  polyethylene glycol (MIRALAX / GLYCOLAX) packet Take 17 g by mouth daily at 6 (six) AM.    Yes [provider]  potassium chloride (KLOR-CON) 10 MEQ tablet Take 2 tablets (20 mEq total) by mouth daily. 03/04/19  Yes Lucky Cowboy, MD  potassium chloride SA (KLOR-CON) 20 MEQ tablet Take 1 tablet (20 mEq total) by mouth 2 (two) times daily. Patient taking differently: Take 20 mEq by mouth daily at 12 noon. 05/19/19  Yes Davonna Belling, MD  Spacer/Aero Chamber Mouthpiece MISC 1 Units by Does not apply route every  4 (four) hours as needed (wheezing). 04/17/17  Yes Purohit, Konrad Dolores, MD  cephALEXin (KEFLEX) 250 MG capsule Take 1 capsule (250 mg total) by mouth 4 (four) times daily. Patient not taking: Reported on 08/02/2020 06/15/19   Hayden Rasmussen, MD    Physical Exam: Vitals:   08/02/20 0818 08/02/20 0900 08/02/20 1003  BP: (!) 121/53 (!) 108/57 (!) 111/59  Pulse: 63 68 72  Resp: 17 (!) 27 19  Temp: 97.6 F (36.4 C)    TempSrc: Oral    SpO2: 91% 90% 98%    General: 87 y.o. female resting in bed in NAD Eyes: PERRL, normal sclera ENMT: Nares patent w/o discharge, orophaynx clear, dentition normal, ears w/o discharge/lesions/ulcers Neck: Supple, trachea midline Cardiovascular: RRR, +S1, S2, no m/g/r, equal pulses throughout Respiratory: no w/r, RUL rhonchi normal WOB GI: BS+, NDNT, no masses noted, no organomegaly noted MSK: No e/c/c Skin: No rashes, bruises, ulcerations noted Neuro: A&O x name only, no focal deficits Psyc: calm/cooperative  Labs on Admission: I have personally reviewed following labs and imaging studies  CBC: Recent Labs  Lab 08/02/20 0830  WBC 9.8  NEUTROABS 7.5  HGB 12.4  HCT 37.5  MCV 97.9  PLT 341   Basic Metabolic Panel: Recent Labs  Lab 08/02/20 0830  NA 137  K 4.1  CL 103  CO2 26  GLUCOSE 120*  BUN 15  CREATININE 1.16*  CALCIUM 9.6   GFR: CrCl cannot be calculated (Unknown ideal weight.). Liver Function Tests: Recent Labs  Lab 08/02/20 0830  AST 15  ALT 9  ALKPHOS 41  BILITOT 0.9  PROT 8.0  ALBUMIN 3.6   No results for input(s): LIPASE, AMYLASE in the last 168 hours. No results for input(s): AMMONIA in the last 168 hours. Coagulation Profile: No results for input(s): INR, PROTIME in the last 168 hours. Cardiac Enzymes: No results for input(s): CKTOTAL, CKMB, CKMBINDEX, TROPONINI in the last 168 hours. BNP (last 3 results) No results for input(s): PROBNP in the last 8760 hours. HbA1C: No results for input(s): HGBA1C in the last  72 hours. CBG: Recent Labs  Lab 08/02/20 0821  GLUCAP 110*   Lipid Profile: No results for input(s): CHOL, HDL, LDLCALC, TRIG, CHOLHDL, LDLDIRECT in the last 72 hours. Thyroid Function Tests: No results for input(s): TSH, T4TOTAL, FREET4, T3FREE, THYROIDAB in the last 72 hours. Anemia Panel: No results for input(s): VITAMINB12, FOLATE, FERRITIN, TIBC, IRON, RETICCTPCT in the last 72 hours. Urine analysis:    Component Value Date/Time   COLORURINE YELLOW 08/02/2020 0945   APPEARANCEUR HAZY (A) 08/02/2020 0945   LABSPEC 1.014 08/02/2020 0945   PHURINE 6.0 08/02/2020 0945   GLUCOSEU NEGATIVE 08/02/2020 0945   HGBUR MODERATE (A) 08/02/2020 0945   BILIRUBINUR NEGATIVE 08/02/2020  [place] 08/02/2020 0945   PROTEINUR 30 (A) 08/02/2020 0945   UROBILINOGEN 0.2 08/10/2011 1650   NITRITE NEGATIVE 08/02/2020 0945   LEUKOCYTESUR TRACE (A) 08/02/2020 0945    Radiological Exams on Admission: CT Head Wo Contrast  Result Date: 08/02/2020 CLINICAL DATA:  Altered mental status beginning yesterday. Baseline dementia. Upper respiratory symptoms 2 days. EXAM: CT HEAD WITHOUT CONTRAST TECHNIQUE: Contiguous axial images were obtained from the base of the skull through the vertex without intravenous contrast. COMPARISON:  06/22/2019 FINDINGS: Brain: Examination demonstrates ventricles and cisterns to be within normal. There is mild age related atrophy unchanged. There is chronic ischemic microvascular disease. There is no mass, mass effect, shift of midline structures or acute hemorrhage. No evidence of acute infarction. Vascular: No hyperdense vessel or unexpected calcification. Skull: Normal. Negative for fracture or focal lesion. Sinuses/Orbits: No acute finding. Other: None. IMPRESSION: 1. No acute findings. 2. Chronic ischemic microvascular disease and age related atrophy. Electronically Signed   By: Marin Olp M.D.   On: 08/02/2020 08:27   DG Chest Portable 1 View  Result Date:  08/02/2020 CLINICAL DATA:  Altered mental status starting yesterday. Cough and congestion 2 days. EXAM: PORTABLE CHEST 1 VIEW COMPARISON:  05/19/2019, 02/28/2019 FINDINGS: Patient is rotated to the right. Mild interval worsening of airspace opacification over the right upper lobe likely due to infection. Stable mild hazy density over the lateral right base. Linear atelectasis left base. No effusion. Cardiomediastinal silhouette and remainder of the exam is unchanged. IMPRESSION: Interval worsening airspace opacification over the right upper lobe likely due to infection. Stable hazy density lateral right base. Linear atelectasis left base. Electronically Signed   By: Marin Olp M.D.   On: 08/02/2020 08:23    EKG: None obtained in ED.   Assessment/Plan RUL PNA     - admit to inpt, med-surg     - continue rocephin/zithro     - checking urine legionella and strep     - IS, guaifenesin     - wean O2 as able  Dementia     - at baseline, continue home regimens  CKD3b     - at baseline, watch nephrotoxins  Hx of DVT/PE     - continue eliquis  DVT prophylaxis: SCDs  Code Status: DNR (confirmed with family at bedside)  Family Communication: w/ dtr at bedside  Consults called: None   Status is: Inpatient  Remains inpatient appropriate because:Inpatient level of care appropriate due to severity of illness  Dispo: The patient is from: SNF              Anticipated d/c is to: SNF              Patient currently is not medically stable to d/c.   Difficult to place patient No  Time spent coordinating admission: 45 minutes  [facility]  If 7PM-7AM, please contact night-coverage [email or web address]  08/02/2020, 11:08 AM

## 2020-08-02 NOTE — ED Triage Notes (Signed)
Pt BIB [facility] from [facility] for AMS. Baseline dementia. Facility reports AMS starting yesterday. Pt reports URI s/x nasal congestion, rhonchi LLL, productive cough x2 days. Minimal intake yesterday. No other complaints.  BP 130s HR 80s CBG 149 SpO2 98% RR 20

## 2020-08-02 NOTE — Progress Notes (Signed)
Patient admitted from ED. Patient is unable to answer questions for admission due to Dementia and no family at bedside. Will continue to monitor.

## 2020-08-03 DIAGNOSIS — I2699 Other pulmonary embolism without acute cor pulmonale: Secondary | ICD-10-CM

## 2020-08-03 DIAGNOSIS — I82409 Acute embolism and thrombosis of unspecified deep veins of unspecified lower extremity: Secondary | ICD-10-CM | POA: Diagnosis present

## 2020-08-03 DIAGNOSIS — F0390 Unspecified dementia without behavioral disturbance: Secondary | ICD-10-CM

## 2020-08-03 DIAGNOSIS — E860 Dehydration: Secondary | ICD-10-CM

## 2020-08-03 DIAGNOSIS — I825Y9 Chronic embolism and thrombosis of unspecified deep veins of unspecified proximal lower extremity: Secondary | ICD-10-CM

## 2020-08-03 DIAGNOSIS — G9341 Metabolic encephalopathy: Secondary | ICD-10-CM | POA: Diagnosis present

## 2020-08-03 DIAGNOSIS — N183 Chronic kidney disease, stage 3 unspecified: Secondary | ICD-10-CM | POA: Diagnosis present

## 2020-08-03 LAB — CBC
HCT: 35.4 % — ABNORMAL LOW (ref 36.0–46.0)
Hemoglobin: 11.5 g/dL — ABNORMAL LOW (ref 12.0–15.0)
MCH: 31.8 pg (ref 26.0–34.0)
MCHC: 32.5 g/dL (ref 30.0–36.0)
MCV: 97.8 fL (ref 80.0–100.0)
Platelets: 186 10*3/uL (ref 150–400)
RBC: 3.62 MIL/uL — ABNORMAL LOW (ref 3.87–5.11)
RDW: 13 % (ref 11.5–15.5)
WBC: 6.8 10*3/uL (ref 4.0–10.5)
nRBC: 0 % (ref 0.0–0.2)

## 2020-08-03 LAB — URINE CULTURE

## 2020-08-03 LAB — COMPREHENSIVE METABOLIC PANEL
ALT: 9 U/L (ref 0–44)
AST: 14 U/L — ABNORMAL LOW (ref 15–41)
Albumin: 3.2 g/dL — ABNORMAL LOW (ref 3.5–5.0)
Alkaline Phosphatase: 41 U/L (ref 38–126)
Anion gap: 7 (ref 5–15)
BUN: 17 mg/dL (ref 8–23)
CO2: 26 mmol/L (ref 22–32)
Calcium: 9.1 mg/dL (ref 8.9–10.3)
Chloride: 106 mmol/L (ref 98–111)
Creatinine, Ser: 1 mg/dL (ref 0.44–1.00)
GFR, Estimated: 53 mL/min — ABNORMAL LOW (ref >60)
Glucose, Bld: 88 mg/dL (ref 70–99)
Potassium: 3.9 mmol/L (ref 3.5–5.1)
Sodium: 139 mmol/L (ref 135–145)
Total Bilirubin: 0.8 mg/dL (ref 0.3–1.2)
Total Protein: 7.1 g/dL (ref 6.5–8.1)

## 2020-08-03 MED ORDER — PANTOPRAZOLE SODIUM 40 MG PO TBEC
40.0000 mg | DELAYED_RELEASE_TABLET | Freq: Every day | ORAL | Status: DC
  Administered 2020-08-03 – 2020-08-06 (×4): 40 mg via ORAL
  Filled 2020-08-03 (×4): qty 1

## 2020-08-03 MED ORDER — IPRATROPIUM-ALBUTEROL 0.5-2.5 (3) MG/3ML IN SOLN
3.0000 mL | Freq: Three times a day (TID) | RESPIRATORY_TRACT | Status: DC
  Administered 2020-08-03 – 2020-08-04 (×4): 3 mL via RESPIRATORY_TRACT
  Filled 2020-08-03 (×4): qty 3

## 2020-08-03 MED ORDER — LORATADINE 10 MG PO TABS
10.0000 mg | ORAL_TABLET | Freq: Every day | ORAL | Status: DC
  Administered 2020-08-03 – 2020-08-06 (×4): 10 mg via ORAL
  Filled 2020-08-03 (×4): qty 1

## 2020-08-03 MED ORDER — ADULT MULTIVITAMIN W/MINERALS CH
1.0000 | ORAL_TABLET | Freq: Every day | ORAL | Status: DC
  Administered 2020-08-04 – 2020-08-06 (×3): 1 via ORAL
  Filled 2020-08-03 (×3): qty 1

## 2020-08-03 MED ORDER — ENSURE ENLIVE PO LIQD
237.0000 mL | Freq: Two times a day (BID) | ORAL | Status: DC
  Administered 2020-08-03 – 2020-08-06 (×7): 237 mL via ORAL

## 2020-08-03 NOTE — Progress Notes (Addendum)
PROGRESS NOTE    Dana Bush  ZHY:[id number] DOB: 1926/11/30 DOA: 08/02/2020 PCP: Deland Pretty, MD    Chief complaint: Confusion   Brief Narrative:  Patient 87 year old female history of dementia, prior history of DVT/PE on chronic anticoagulation, depression presented to the ED with decreased appetite and confusion.  History was obtained from patient's daughter and per admitting MD patient is a resident at [facility], daughter stated her brother received a call from facility and mother was being transferred to the ED for further evaluation.  Patient noted to have had poor oral intake for several days with significant weakness and confusion and noted to be incontinent of urine.  There was initial concern for UTI.  Patient seen in the ED work-up concerning for pneumonia.   Assessment & Plan:   Principal Problem:   PNA (pneumonia) Active Problems:   Dementia ([place])   Acute pulmonary embolism (HCC)   DVT (deep venous thrombosis) (HCC)   Acute metabolic encephalopathy   Dehydration   CKD (chronic kidney disease), symptom management only, stage 3 (moderate) (HCC) 3b   #1 right upper lobe pneumonia -Patient presented with decreased oral intake, worsening confusion. -Chest x-ray done concerning for right upper lobe pneumonia. -Urine strep pneumococcus antigen negative. -Urine Legionella antigen pending. -Check a sputum gram stain and culture. -Continue empiric IV Rocephin and azithromycin. -Continue Mucinex. -SLP evaluation to rule out aspiration. -Place on scheduled duo nebs, Claritin, PPI.  2.  Acute metabolic encephalopathy -Likely secondary to problem #1. -Patient more alert this morning, oriented to self and place. -Continue treatment with empiric IV antibiotics for problem #1. -IV fluids -Supportive care.  3.  Dehydration -IV fluids.  4.  History of DVT/PE -Eliquis.  5.  Chronic kidney disease stage IIIb -Stable.  6.  Dementia -Stable.   DVT prophylaxis:  Eliquis Code Status: DNR Family Communication: Updated patient.  No family at bedside. Disposition:   Status is: Inpatient  Remains inpatient appropriate because:IV treatments appropriate due to intensity of illness or inability to take PO  Dispo: The patient is from: SNF              Anticipated d/c is to: SNF              Patient currently is not medically stable to d/c.   Difficult to place patient No       Consultants:  None  Procedures:  CT head 08/02/2020 Chest x-ray 08/02/2020    Antimicrobials:  IV Rocephin 08/02/2020>>> IV azithromycin 7/10/ 2022x1 Oral azithromycin 08/03/2020   Subjective: Patient alert and oriented to self and place.  Overall feeling better.  Denies any chest pain.  Denies any significant shortness of breath.  Objective: Vitals:   08/02/20 2100 08/03/20 0611 08/03/20 0904 08/03/20 1843  BP: 114/64 (!) 114/55  110/70  Pulse: 60 (!) 56  70  Resp:  19  16  Temp: 97.9 F (36.6 C) 98.3 F (36.8 C)  98.4 F (36.9 C)  TempSrc: Oral Oral  Oral  SpO2: 94% 94% 90% 95%    Intake/Output Summary (Last 24 hours) at 08/03/2020 1852 Last data filed at 08/03/2020 1843 Gross per 24 hour  Intake 280 ml  Output 300 ml  Net -20 ml   There were no vitals filed for this visit.  Examination:  General exam: Appears calm and comfortable.  Dry mucous membranes. Respiratory system: Some scattered coarse breath sounds, no crackles, speaking in full sentences, normal respiratory effort.   Cardiovascular system: S1 & S2 heard,  RRR. No JVD, murmurs, rubs, gallops or clicks. No pedal edema. Gastrointestinal system: Abdomen is nondistended, soft and nontender. No organomegaly or masses felt. Normal bowel sounds heard. Central nervous system: Alert and oriented x2. No focal neurological deficits.  Moving extremities spontaneously. Extremities: Symmetric 5 x 5 power. Skin: No rashes, lesions or ulcers Psychiatry: Judgement and insight appear poor to fair. Mood &  affect appropriate.     Data Reviewed: I have personally reviewed following labs and imaging studies  CBC: Recent Labs  Lab 08/02/20 0830 08/03/20 0515  WBC 9.8 6.8  NEUTROABS 7.5  --   HGB 12.4 11.5*  HCT 37.5 35.4*  MCV 97.9 97.8  PLT 185 449    Basic Metabolic Panel: Recent Labs  Lab 08/02/20 0830 08/03/20 0515  NA 137 139  K 4.1 3.9  CL 103 106  CO2 26 26  GLUCOSE 120* 88  BUN 15 17  CREATININE 1.16* 1.00  CALCIUM 9.6 9.1    GFR: CrCl cannot be calculated (Unknown ideal weight.).  Liver Function Tests: Recent Labs  Lab 08/02/20 0830 08/03/20 0515  AST 15 14*  ALT 9 9  ALKPHOS 41 41  BILITOT 0.9 0.8  PROT 8.0 7.1  ALBUMIN 3.6 3.2*    CBG: Recent Labs  Lab 08/02/20 0821  GLUCAP 110*     Recent Results (from the past 240 hour(s))  Resp Panel by RT-PCR (Flu A&B, Covid) Nasopharyngeal Swab     Status: None   Collection Time: 08/02/20  8:30 AM   Specimen: Nasopharyngeal Swab; Nasopharyngeal(NP) swabs in vial transport medium  Result Value Ref Range Status   SARS Coronavirus 2 by RT PCR NEGATIVE NEGATIVE Final    Comment: (NOTE) SARS-CoV-2 target nucleic acids are NOT DETECTED.  The SARS-CoV-2 RNA is generally detectable in upper respiratory specimens during the acute phase of infection. The lowest concentration of SARS-CoV-2 viral copies this assay can detect is 138 copies/mL. A negative result does not preclude SARS-Cov-2 infection and should not be used as the sole basis for treatment or other patient management decisions. A negative result may occur with  improper specimen collection/handling, submission of specimen other than nasopharyngeal swab, presence of viral mutation(s) within the areas targeted by this assay, and inadequate number of viral copies(<138 copies/mL). A negative result must be combined with clinical observations, patient history, and epidemiological information. The expected result is Negative.  Fact Sheet for  Patients:  EntrepreneurPulse.com.au  Fact Sheet for Healthcare Providers:  IncredibleEmployment.be  This test is no t yet approved or cleared by the [place] FDA and  has been authorized for detection and/or diagnosis of SARS-CoV-2 by FDA under an Emergency Use Authorization (EUA). This EUA will remain  in effect (meaning this test can be used) for the duration of the COVID-19 declaration under Section 564(b)(1) of the Act, 21 U.S.C.section 360bbb-3(b)(1), unless the authorization is terminated  or revoked sooner.       Influenza A by PCR NEGATIVE NEGATIVE Final   Influenza B by PCR NEGATIVE NEGATIVE Final    Comment: (NOTE) The Xpert Xpress SARS-CoV-2/FLU/RSV plus assay is intended as an aid in the diagnosis of influenza from Nasopharyngeal swab specimens and should not be used as a sole basis for treatment. Nasal washings and aspirates are unacceptable for Xpert Xpress SARS-CoV-2/FLU/RSV testing.  Fact Sheet for Patients: EntrepreneurPulse.com.au  Fact Sheet for Healthcare Providers: IncredibleEmployment.be  This test is not yet approved or cleared by the [place] FDA and has been authorized for detection and/or  diagnosis of SARS-CoV-2 by FDA under an Emergency Use Authorization (EUA). This EUA will remain in effect (meaning this test can be used) for the duration of the COVID-19 declaration under Section 564(b)(1) of the Act, 21 U.S.C. section 360bbb-3(b)(1), unless the authorization is terminated or revoked.  Performed at [facility], [place] [address], [place], [place] [address]   Urine culture     Status: Abnormal   Collection Time: 08/02/20  9:45 AM   Specimen: Urine, Random  Result Value Ref Range Status   Specimen Description   Final    URINE, RANDOM Performed at [facility] [address], [place], [place] [address]    Special Requests    Final    NONE Performed at [facility], [place] [address], [place], [place] [address]    Culture MULTIPLE SPECIES PRESENT, SUGGEST RECOLLECTION (A)  Final   Report Status 08/03/2020 FINAL  Final         Radiology Studies: CT Head Wo Contrast  Result Date: 08/02/2020 CLINICAL DATA:  Altered mental status beginning yesterday. Baseline dementia. Upper respiratory symptoms 2 days. EXAM: CT HEAD WITHOUT CONTRAST TECHNIQUE: Contiguous axial images were obtained from the base of the skull through the vertex without intravenous contrast. COMPARISON:  06/22/2019 FINDINGS: Brain: Examination demonstrates ventricles and cisterns to be within normal. There is mild age related atrophy unchanged. There is chronic ischemic microvascular disease. There is no mass, mass effect, shift of midline structures or acute hemorrhage. No evidence of acute infarction. Vascular: No hyperdense vessel or unexpected calcification. Skull: Normal. Negative for fracture or focal lesion. Sinuses/Orbits: No acute finding. Other: None. IMPRESSION: 1. No acute findings. 2. Chronic ischemic microvascular disease and age related atrophy. Electronically Signed   By: Marin Olp M.D.   On: 08/02/2020 08:27   DG Chest Portable 1 View  Result Date: 08/02/2020 CLINICAL DATA:  Altered mental status starting yesterday. Cough and congestion 2 days. EXAM: PORTABLE CHEST 1 VIEW COMPARISON:  05/19/2019, 02/28/2019 FINDINGS: Patient is rotated to the right. Mild interval worsening of airspace opacification over the right upper lobe likely due to infection. Stable mild hazy density over the lateral right base. Linear atelectasis left base. No effusion. Cardiomediastinal silhouette and remainder of the exam is unchanged. IMPRESSION: Interval worsening airspace opacification over the right upper lobe likely due to infection. Stable hazy density lateral right base. Linear atelectasis left base. Electronically Signed   By: Marin Olp M.D.   On: 08/02/2020 08:23        Scheduled Meds:  apixaban  5 mg Oral BID   azithromycin  500 mg Oral Daily   citalopram  20 mg Oral Q0600   donepezil  10 mg Oral QHS   famotidine  20 mg Oral QHS   feeding supplement  237 mL Oral BID BM   ferrous sulfate  325 mg Oral Q0600   guaiFENesin  600 mg Oral BID   ipratropium-albuterol  3 mL Nebulization TID   loratadine  10 mg Oral Daily   mirtazapine  7.5 mg Oral QHS   [START ON 08/04/2020] multivitamin with minerals  1 tablet Oral Daily   pantoprazole  40 mg Oral Daily   polyethylene glycol  17 g Oral Q0600   potassium chloride SA  20 mEq Oral Q1200   Continuous Infusions:  cefTRIAXone (ROCEPHIN)  IV Stopped (08/03/20 1052)     LOS: 1 day    Time spent: 40 minutes    Irine Seal, MD  [facility]   To contact the attending provider between 7A-7P or the covering provider during after hours 7P-7A, please log into the web site [email or web address] and access using universal [place] password for that web site. If you do not have the password, please call the hospital operator.  08/03/2020, 6:52 PM

## 2020-08-03 NOTE — Progress Notes (Signed)
Initial Nutrition Assessment  INTERVENTION:   -Ensure Enlive po BID, each supplement provides 350 kcal and 20 grams of protein  -Multivitamin with minerals daily  -Needs weight for admission, if able to obtain  NUTRITION DIAGNOSIS:   Inadequate oral intake related to poor appetite, lethargy/confusion as evidenced by meal completion < 50%.  GOAL:   Patient will meet greater than or equal to 90% of their needs  MONITOR:   PO intake, Supplement acceptance, Labs, Weight trends, I & O's  REASON FOR ASSESSMENT:   Malnutrition Screening Tool    ASSESSMENT:   87 y.o. female with medical history significant of dementia,  previous DVT/PE on anticoagulation, depression. Presenting with confusion and poor appetite. Admitted for RUL PNA.  Patient with dementia, currently a/o x 1.  Per chart review, pt with poor appetite and stopped eating at SNF on 7/9.  Ensure supplements have been ordered, will also add daily MVI.  No weight history available since July 2021.  Needs weight for admission, if able to obtain.   Medications: Ferrous sulfate, Remeron, Miralax, KLOR-CON  Labs reviewed:  CBGs: 110  NUTRITION - FOCUSED PHYSICAL EXAM:  Deferred  Diet Order:   Diet Order             Diet Heart Room service appropriate? Yes; Fluid consistency: Thin  Diet effective now                   EDUCATION NEEDS:   No education needs have been identified at this time  Skin:  Skin Assessment: Reviewed RN Assessment  Last BM:  PTA  Height:   Ht Readings from Last 1 Encounters:  08/20/19 5\' 4"  (1.626 m)    Weight:   Wt Readings from Last 1 Encounters:  08/20/19 52.2 kg    BMI:  There is no height or weight on file to calculate BMI.  Estimated Nutritional Needs:   Kcal:  1350-1550  Protein:  60-70g  Fluid:  1.5L/day  Clayton Bibles, MS, RD, LDN Inpatient Clinical Dietitian Contact information available via Amion

## 2020-08-03 NOTE — Progress Notes (Signed)
With assistance PT demonstrates ability to utilize Flutter.

## 2020-08-03 NOTE — Evaluation (Signed)
Clinical/Bedside Swallow Evaluation Patient Details  Name: Dana Bush MRN: [id number] Date of Birth: 1926/05/19  Today's Date: 08/03/2020 Time: SLP Start Time (ACUTE ONLY): 1100 SLP Stop Time (ACUTE ONLY): 1120 SLP Time Calculation (min) (ACUTE ONLY): 20 min  Past Medical History:  Past Medical History:  Diagnosis Date   Anxiety    Compression fracture of L2 lumbar vertebra (HCC)    Dementia (HCC)    Depression    Hyperlipemia    IgG monoclonal gammopathy    Osteoporosis    Peptic ulcer disease    Past Surgical History:  Past Surgical History:  Procedure Laterality Date   cervical polyp removal     DILATION AND CURETTAGE OF UTERUS     TONSILLECTOMY     HPI:  87yo female Dana Bush resident admitted 08/02/20 with confusion and poor appetite. PMH: dementia, DVT/PE, depression, anxiety, L2 compression fx, HLD, osteoporosis, PUD. CXR = Interval worsening airspace opacification over the right upper lobe likely due to infection. Stable hazy density lateral right base.  Linear atelectasis left base.   Assessment / Plan / Recommendation Clinical Impression  Pt seen at bedside to assess swallow function and safety. Pt was awake and alert, pleasantly confused. CN exam WFL. Pt is missing dentition. She accepted trials of thin liquid, puree, and solid textures. Timely oral prep and adequate clearing. Adequate laryngeal elevation per palpation. No overt s/s aspiration following any consistency. Will continue current diet (regular/thin), crushed meds. Given advanced age and hx dementia, will follow briefly to assess diet tolerance and continue education. Safe swallow precautions posted at [place] [facility]. RN and MD informed.   SLP Visit Diagnosis: Dysphagia, unspecified (R13.10)    Aspiration Risk  Mild aspiration risk    Diet Recommendation Regular;Thin liquid   Liquid Administration via: Straw;Cup Medication Administration: Crushed with puree Supervision: Patient able to self feed;Staff to  assist with self feeding;Intermittent supervision to cue for compensatory strategies Compensations: Slow rate;Small sips/bites;Minimize environmental distractions Postural Changes: Seated upright at 90 degrees    Other  Recommendations Oral Care Recommendations: Oral care BID   Follow up Recommendations Skilled Nursing facility;24 hour supervision/assistance      Frequency and Duration min 1 x/week  1 week;2 weeks       Prognosis Prognosis for Safe Diet Advancement: Fair Barriers to Reach Goals: Cognitive deficits      Swallow Study   General Date of Onset: 08/02/20 HPI: 87yo female Dana Bush resident admitted 08/02/20 with confusion and poor appetite. PMH: dementia, DVT/PE, depression, anxiety, L2 compression fx, HLD, osteoporosis, PUD. CXR = Interval worsening airspace opacification over the right upper lobe likely due to infection. Stable hazy density lateral right base.  Linear atelectasis left base. Type of Study: Bedside Swallow Evaluation Previous Swallow Assessment: none Diet Prior to this Study: Regular;Thin liquids Temperature Spikes Noted: No Respiratory Status: Nasal cannula History of Recent Intubation: No Behavior/Cognition: Alert;Cooperative;Pleasant mood;Confused Oral Cavity Assessment: Within Functional Limits Oral Care Completed by SLP: No Oral Cavity - Dentition: Missing dentition Vision: Functional for self-feeding Self-Feeding Abilities: Able to feed self Patient Positioning: Upright in bed Baseline Vocal Quality: Hoarse Volitional Cough: Strong Volitional Swallow: Able to elicit    Oral/Motor/Sensory Function Overall Oral Motor/Sensory Function: Within functional limits   Ice Chips Ice chips: Not tested   Thin Liquid Thin Liquid: Within functional limits Presentation: Straw    Nectar Thick Nectar Thick Liquid: Not tested   Honey Thick Honey Thick Liquid: Not tested   Puree Puree: Within functional limits Presentation: Self Fed;Spoon  Solid      Solid: Within functional limits Presentation: Dana Bush B. Dana Bush, Dana Bush, [name] Speech Language Pathologist Office: [phone] Pager: [phone]  Shonna Chock 08/03/2020,11:30 AM

## 2020-08-04 ENCOUNTER — Encounter (HOSPITAL_COMMUNITY): Payer: Self-pay | Admitting: Internal Medicine

## 2020-08-04 LAB — RENAL FUNCTION PANEL
Albumin: 2.9 g/dL — ABNORMAL LOW (ref 3.5–5.0)
Anion gap: 7 (ref 5–15)
BUN: 14 mg/dL (ref 8–23)
CO2: 23 mmol/L (ref 22–32)
Calcium: 8.8 mg/dL — ABNORMAL LOW (ref 8.9–10.3)
Chloride: 107 mmol/L (ref 98–111)
Creatinine, Ser: 0.81 mg/dL (ref 0.44–1.00)
GFR, Estimated: >60 mL/min (ref >60)
Glucose, Bld: 99 mg/dL (ref 70–99)
Phosphorus: 2.3 mg/dL — ABNORMAL LOW (ref 2.5–4.6)
Potassium: 3.6 mmol/L (ref 3.5–5.1)
Sodium: 137 mmol/L (ref 135–145)

## 2020-08-04 LAB — LEGIONELLA PNEUMOPHILA SEROGP 1 UR AG: L. pneumophila Serogp 1 Ur Ag: NEGATIVE

## 2020-08-04 LAB — CBC
HCT: 33.7 % — ABNORMAL LOW (ref 36.0–46.0)
Hemoglobin: 11.5 g/dL — ABNORMAL LOW (ref 12.0–15.0)
MCH: 32.5 pg (ref 26.0–34.0)
MCHC: 34.1 g/dL (ref 30.0–36.0)
MCV: 95.2 fL (ref 80.0–100.0)
Platelets: 224 10*3/uL (ref 150–400)
RBC: 3.54 MIL/uL — ABNORMAL LOW (ref 3.87–5.11)
RDW: 12.7 % (ref 11.5–15.5)
WBC: 7.1 10*3/uL (ref 4.0–10.5)
nRBC: 0 % (ref 0.0–0.2)

## 2020-08-04 LAB — MAGNESIUM: Magnesium: 1.8 mg/dL (ref 1.7–2.4)

## 2020-08-04 MED ORDER — FLUTICASONE PROPIONATE 50 MCG/ACT NA SUSP
2.0000 | Freq: Every day | NASAL | Status: DC
  Administered 2020-08-04 – 2020-08-06 (×3): 2 via NASAL
  Filled 2020-08-04: qty 16

## 2020-08-04 MED ORDER — IPRATROPIUM-ALBUTEROL 0.5-2.5 (3) MG/3ML IN SOLN
3.0000 mL | Freq: Two times a day (BID) | RESPIRATORY_TRACT | Status: DC
  Administered 2020-08-04 – 2020-08-05 (×2): 3 mL via RESPIRATORY_TRACT
  Filled 2020-08-04 (×3): qty 3

## 2020-08-04 MED ORDER — K PHOS MONO-SOD PHOS DI & MONO 155-852-130 MG PO TABS
250.0000 mg | ORAL_TABLET | Freq: Two times a day (BID) | ORAL | Status: DC
  Administered 2020-08-04 – 2020-08-06 (×5): 250 mg via ORAL
  Filled 2020-08-04 (×5): qty 1

## 2020-08-04 MED ORDER — PSEUDOEPHEDRINE HCL 30 MG PO TABS
30.0000 mg | ORAL_TABLET | Freq: Two times a day (BID) | ORAL | Status: DC
  Administered 2020-08-04 – 2020-08-06 (×5): 30 mg via ORAL
  Filled 2020-08-04 (×5): qty 1

## 2020-08-04 MED ORDER — METHYLPREDNISOLONE SODIUM SUCC 40 MG IJ SOLR
40.0000 mg | Freq: Once | INTRAMUSCULAR | Status: AC
  Administered 2020-08-04: 40 mg via INTRAVENOUS
  Filled 2020-08-04: qty 1

## 2020-08-04 NOTE — Evaluation (Signed)
Occupational Therapy Evaluation Patient Details Name: Dana [facility] MRN: [id number] DOB: [birth year] Today's Date: 08/04/2020    History of Present Illness Patient 87 year old female history of dementia, prior history of DVT/PE on chronic anticoagulation, depression presented to the ED with decreased appetite and confusion, incontinence, concerning for UTI and pneumonia.    Clinical Impression   Patient is a confused 87 year old female who was noted to have deficits in ability to participate in ADLs on this date. Patient was unable to give PLOF and family was not present. Chart noted patient is from [facility]. Patient was noted to required mod A for transfer to bedside commode with noted posterior leaning with RW when standing. Patient was easily distracted and needed increased cues to attend to task. Patient was noted to have decreased standing balance, decreased activity tolerance, decreased ability to participate in ADLs.  Patient would continue to benefit from skilled OT services at this time while admitted and after d/c to address noted deficits in order to improve overall safety and independence in ADLs.      Follow Up Recommendations  Home health OT;Supervision/Assistance - 24 hour    Equipment Recommendations       Recommendations for Other Services       Precautions / Restrictions Precautions Precautions: Fall Precaution Comments: incontinence Restrictions Weight Bearing Restrictions: No      Mobility Bed Mobility Overal bed mobility: Needs Assistance Bed Mobility: Sidelying to Sit   Sidelying to sit: Min guard       General bed mobility comments: extra time and encouragement to perform.    Transfers Overall transfer level: Needs assistance Equipment used: Rolling walker (2 wheeled) Transfers: Sit to/from [facility] Sit to Stand: Mod assist Stand pivot transfers: Mod assist       General transfer comment: patient had shuffling feet  to turn walker with patient noted to have one LOB posterioly with mod A to regain standing balance with education to keep toes on floor.    Balance Overall balance assessment: Needs assistance Sitting-balance support: Bilateral upper extremity supported;Feet supported Sitting balance-Leahy Scale: Fair     Standing balance support: During functional activity;Bilateral upper extremity supported Standing balance-Leahy Scale: Poor Standing balance comment: reliant on RW and support                           ADL either performed or assessed with clinical judgement   ADL Overall ADL's : Needs assistance/impaired Eating/Feeding: Set up;Sitting   Grooming: Wash/dry face;Wash/dry hands;Supervision/safety;Sitting   Upper Body Bathing: Minimal assistance;Sitting   Lower Body Bathing: Moderate assistance;Sitting/lateral leans   Upper Body Dressing : Minimal assistance;Sitting   Lower Body Dressing: Moderate assistance;Sitting/lateral leans   Toilet Transfer: Moderate assistance;BSC;RW   Toileting- Clothing Manipulation and Hygiene: Moderate assistance;Sit to/from stand       Functional mobility during ADLs: Min guard;Rolling walker General ADL Comments: patient required increased cues to remain on task. patient was cued to get up and then once up patient required redirection to transitioning to [facility] as planned.     Vision   Vision Assessment?: No apparent visual deficits     Perception     Praxis      Pertinent Vitals/Pain Pain Assessment: No/denies pain     Hand Dominance Right   Extremity/Trunk Assessment Upper Extremity Assessment Upper Extremity Assessment: Generalized weakness   Lower Extremity Assessment Lower Extremity Assessment: Generalized weakness   Cervical / Trunk Assessment Cervical /  Trunk Assessment: Normal   Communication Communication Communication: No difficulties   Cognition Arousal/Alertness: Awake/alert Behavior During Therapy: WFL  for tasks assessed/performed Overall Cognitive Status: History of cognitive impairments - at baseline Area of Impairment: Orientation;Memory;Following commands;Awareness                 Orientation Level: Situation;Time   Memory: Decreased short-term memory Following Commands: Follows one step commands consistently   Awareness: Emergent   General Comments: oriented to hospital, and self   General Comments       Exercises     Shoulder Instructions      Home Living Family/patient expects to be discharged to:: Assisted living                                 Additional Comments: [name]      Prior Functioning/Environment          Comments: unsure of ambulatory status and amout of required support from caregiver. patient was unable to give PLOF.        OT Problem List: Decreased strength;Impaired balance (sitting and/or standing);Decreased safety awareness;Decreased activity tolerance;Decreased coordination      OT Treatment/Interventions: Self-care/ADL training;DME and/or AE instruction;Therapeutic activities;Balance training;Therapeutic exercise;Patient/family education    OT Goals(Current goals can be found in the care plan section) Acute Rehab OT Goals Patient Stated Goal: get some rest in bed since she does not have housework to do today OT Goal Formulation: With patient Time For Goal Achievement: 08/18/20 Potential to Achieve Goals: Good  OT Frequency: Min 2X/week   Barriers to D/C:            Co-evaluation              AM-PAC OT "6 Clicks" Daily Activity     Outcome Measure Help from another person eating meals?: A Little Help from another person taking care of personal grooming?: A Little Help from another person toileting, which includes using toliet, bedpan, or urinal?: A Lot Help from another person bathing (including washing, rinsing, drying)?: A Lot Help from another person to put on and taking off regular upper  body clothing?: A Little Help from another person to put on and taking off regular lower body clothing?: A Lot 6 Click Score: 15   End of Session Equipment Utilized During Treatment: Gait belt;Rolling walker Nurse Communication: Other (comment) (nurse present at end of session)  Activity Tolerance: Patient tolerated treatment well Patient left: in bed;with call bell/phone within reach;with bed alarm set  OT Visit Diagnosis: Unsteadiness on feet (R26.81)                Time: 4580-9983 OT Time Calculation (min): 24 min Charges:  OT General Charges $OT Visit: 1 Visit OT Evaluation $OT Eval Low Complexity: 1 Low OT Treatments $Self Care/Home Management : 8-22 mins  Jackelyn Poling OTR/L, MS Acute Rehabilitation Department Office# [phone] Pager# [phone] 08/04/2020, 12:18 PM

## 2020-08-04 NOTE — Evaluation (Signed)
Physical Therapy Evaluation Patient Details Name: Dana [facility] MRN: [id number] DOB: [birth year] Today's Date: 08/04/2020   History of Present Illness  Patient 87 year old female history of dementia, prior history of DVT/PE on chronic anticoagulation, depression presented to the ED with decreased appetite and confusion, incontinence, concerning for UTI and pneumonia. Resides at [facility].  Clinical Impression  The patient is pleasantly confused. Patient comes from Memory care at [facility], unsure of level of physical assistance required for mobility. No family present.  Patient  mobilized to sitting then to recliner with mod assistance  , used RW with assistance to manage RW.  Patient oriented to hospital, not in [place]. Oriented to self. Patient has dementia at baseline.Patient followed simple directions.  Pt admitted with above diagnosis.  Pt currently with functional limitations due to the deficits listed below (see PT Problem List). Pt will benefit from skilled PT to increase their independence and safety with mobility to allow discharge to the venue listed below.    Follow Up Recommendations Home health PT (return to ALF)    Equipment Recommendations  None recommended by PT    Recommendations for Other Services       Precautions / Restrictions Precautions Precautions: Fall Precaution Comments: incontinence      Mobility  Bed Mobility Overal bed mobility: Needs Assistance Bed Mobility: Sidelying to Sit   Sidelying to sit: Min guard       General bed mobility comments: extra time and encouragement to perform. Stated" I am fine where I am"    Transfers Overall transfer level: Needs assistance Equipment used: Rolling walker (2 wheeled) Transfers: Sit to/from [facility] Sit to Stand: Mod assist Stand pivot transfers: Mod assist       General transfer comment: steady assist to rise from bed, shuffling steps and assist with Rw to    ambulate to recliner(3').  Ambulation/Gait Ambulation/Gait assistance: Mod assist Gait Distance (Feet): 3 Feet Assistive device: Rolling walker (2 wheeled) Gait Pattern/deviations: Step-to pattern;Shuffle     General Gait Details: steady [employer] [place] (Stroke Patients Only)       Balance Overall balance assessment: Needs assistance Sitting-balance support: Bilateral upper extremity supported;Feet supported Sitting balance-Leahy Scale: Fair     Standing balance support: During functional activity;Bilateral upper extremity supported Standing balance-Leahy Scale: Poor Standing balance comment: reliant on RW and support                             Pertinent Vitals/Pain Pain Assessment: No/denies pain    Home Living Family/patient expects to be discharged to:: Assisted living                 Additional Comments: [name]    Prior Function           Comments: unsure of ambulatory status and amout of required support from caregiver.     Hand Dominance        Extremity/Trunk Assessment        Lower Extremity Assessment Lower Extremity Assessment: Generalized weakness    Cervical / Trunk Assessment Cervical / Trunk Assessment: Normal  Communication   Communication: No difficulties  Cognition Arousal/Alertness: Awake/alert Behavior During Therapy: WFL for tasks assessed/performed Overall Cognitive Status: History of cognitive impairments - at baseline Area of Impairment: Orientation;Memory;Following commands;Awareness  Orientation Level: Situation;Time   Memory: Decreased short-term memory Following Commands: Follows one step commands consistently   Awareness: Emergent   General Comments: oriented to hospital, and self      General Comments      Exercises     Assessment/Plan    PT Assessment Patient needs continued PT services  PT  Problem List Decreased strength;Decreased balance;Decreased cognition;Decreased mobility;Decreased activity tolerance       PT Treatment Interventions DME instruction;Therapeutic activities;Cognitive remediation;Gait training;Therapeutic exercise;Patient/family education;Functional mobility training    PT Goals (Current goals can be found in the Care Plan section)  Acute Rehab PT Goals Patient Stated Goal: to stay in bed PT Goal Formulation: Patient unable to participate in goal setting Time For Goal Achievement: 08/18/20 Potential to Achieve Goals: Fair    Frequency Min 2X/week   Barriers to discharge        Co-evaluation               AM-PAC PT "6 Clicks" Mobility  Outcome Measure Help needed turning from your back to your side while in a flat bed without using bedrails?: A Lot Help needed moving from lying on your back to sitting on the side of a flat bed without using bedrails?: A Lot Help needed moving to and from a bed to a chair (including a wheelchair)?: A Lot Help needed standing up from a chair using your arms (e.g., wheelchair or bedside chair)?: A Lot Help needed to walk in hospital room?: A Lot Help needed climbing 3-5 steps with a railing? : Total 6 Click Score: 11    End of Session Equipment Utilized During Treatment: Gait belt Activity Tolerance: Patient tolerated treatment well Patient left: in chair;with call bell/phone within reach;with chair alarm set Nurse Communication: Mobility status PT Visit Diagnosis: Muscle weakness (generalized) (M62.81);Unsteadiness on feet (R26.81);Difficulty in walking, not elsewhere classified (R26.2)    Time: 7530-0511 PT Time Calculation (min) (ACUTE ONLY): 19 min   Charges:   PT Evaluation $PT Eval Low Complexity: [facility] PT Acute Rehabilitation Services Pager [phone] Office [phone]   Claretha Cooper 08/04/2020, 9:36 AM

## 2020-08-04 NOTE — Progress Notes (Signed)
  Speech Language Pathology Treatment: Dysphagia  Patient Details Name: Dana Bush MRN: [id number] DOB: 11-May-1926 Today's Date: 08/04/2020 Time: 9702-6378 SLP Time Calculation (min) (ACUTE ONLY): 20 min  Assessment / Plan / Recommendation Clinical Impression  Pt located in room slid down in the bed - with her feet leaning toward the right.  SLP obtained assist from RN to reposition pt to have po.  Pt states "I'm not hungry, I just haven't wanted anything."  She was able to self feed after SLP set up po.  She able to self feed with water, coffee, ice cream and graham cracker.  She did not pass 3 ounce Yale water challenge - due to needing rest breaks, however no indication of aspiration with water. She was observed ro cough subtly x2 after swallowing ice cream -but denies sensation of it "going the wrong way".  Belching noted after liquid swallows and pt admits to occasional issues with reflux - she has a moderate hiatal hernia.  Recommend follow reflux precautions.  Pt's prior brain imaging showed severe cerebellar and cerebral atrophy that may contribute to potential aspiration.     HPI HPI: 87yo female [name] resident admitted 08/02/20 with confusion and poor appetite. PMH: dementia, DVT/PE, depression, anxiety, L2 compression fx, HLD, osteoporosis, PUD. CXR = Interval worsening airspace opacification over the right upper lobe likely due to infection. Stable hazy density lateral right base.  Linear atelectasis left base.      SLP Plan          Recommendations  Diet recommendations: Dysphagia 3 (mechanical soft);Thin liquid Liquids provided via: Straw;Cup Medication Administration: Crushed with puree (whole if small) Supervision: Patient able to self feed (set up assist) Compensations: Slow rate;Small sips/bites;Minimize environmental distractions Postural Changes and/or Swallow Maneuvers: Seated upright 90 degrees;Upright 30-60 min after meal (given h/o hiatal hernia)                 Oral Care Recommendations: Oral care BID Follow up Recommendations: Skilled Nursing facility;24 hour supervision/assistance SLP Visit Diagnosis: Dysphagia, unspecified (R13.10)       GO               Kathleen Lime, MS [facility] SLP Acute Rehab Services Office [phone] Pager [phone]  Macario Golds 08/04/2020, 6:47 PM

## 2020-08-04 NOTE — Progress Notes (Addendum)
PROGRESS NOTE    Dana Bush  JXB:[id number] DOB: [birth year] DOA: 08/02/2020 PCP: Deland Pretty, MD    Chief complaint: Confusion   Brief Narrative:  Patient 87 year old female history of dementia, prior history of DVT/PE on chronic anticoagulation, depression presented to the ED with decreased appetite and confusion.  History was obtained from patient's daughter and per admitting MD patient is a resident at [facility], daughter stated her brother received a call from facility and mother was being transferred to the ED for further evaluation.  Patient noted to have had poor oral intake for several days with significant weakness and confusion and noted to be incontinent of urine.  There was initial concern for UTI.  Patient seen in the ED work-up concerning for pneumonia.   Assessment & Plan:   Principal Problem:   PNA (pneumonia) Active Problems:   Dementia ([place])   Acute pulmonary embolism (HCC)   DVT (deep venous thrombosis) (HCC)   Acute metabolic encephalopathy   Dehydration   CKD (chronic kidney disease), symptom management only, stage 3 (moderate) (HCC) 3b   1 right upper lobe pneumonia -Patient presented with decreased oral intake, worsening confusion. -Chest x-ray done concerning for right upper lobe pneumonia. -Patient with some complaints of sinus congestion. -Urine strep pneumococcus antigen negative. -Urine Legionella antigen negative -Sputum gram stain and culture pending.   -Placed on Sudafed twice daily, Flonase, Claritin.   -We will give a dose of IV Solu-Medrol x1 due to wheezing noted on examination. -Continue Mucinex.   -Continue azithromycin and IV Rocephin.   -If continued improvement could likely transition from IV Rocephin to oral Vantin tomorrow.   -Patient seen by speech therapy and currently on a dysphagia 3 diet.   -Continue scheduled DuoNebs, PPI  2.  Acute metabolic encephalopathy -Likely secondary to problem #1. -Patient more alert,  oriented to self and place.  Following commands.   -Continue empiric IV antibiotics secondary to problem #1.   -Supportive care.   3.  Dehydration -Improved with hydration.  Saline lock IV fluids.  4.  History of DVT/PE -Continue Eliquis.    5.  Chronic kidney disease stage IIIb -Stable.  6.  Dementia -Stable.   DVT prophylaxis: Eliquis Code Status: DNR Family Communication: Updated patient.  Try to call daughter Kingsley Callander but went to voicemail.   Disposition:   Status is: Inpatient  Remains inpatient appropriate because:IV treatments appropriate due to intensity of illness or inability to take PO  Dispo: The patient is from: SNF              Anticipated d/c is to: SNF hopefully in 48 hours              Patient currently is not medically stable to d/c.   Difficult to place patient No       Consultants:  None  Procedures:  CT head 08/02/2020 Chest x-ray 08/02/2020    Antimicrobials:  IV Rocephin 08/02/2020>>> IV azithromycin 7/10/ 2022x1 Oral azithromycin 08/03/2020   Subjective: C/o congestion in sinuses.  Denies any significant chest pain.  Denies any significant shortness of breath.  Overall feeling better.  Alert and following commands.  Objective: Vitals:   08/03/20 2132 08/04/20 0546 08/04/20 0704 08/04/20 0723  BP: 129/61 (!) 121/48    Pulse: 65 (!) 56    Resp: 18 18    Temp: 98 F (36.7 C) 99.1 F (37.3 C)    TempSrc: Oral Oral    SpO2: 96% 97%  93%  Weight:  61 kg   Height:   5\' 2"  (1.575 m)     Intake/Output Summary (Last 24 hours) at 08/04/2020 1151 Last data filed at 08/04/2020 0530 Gross per 24 hour  Intake 120 ml  Output 500 ml  Net -380 ml    Filed Weights   08/04/20 0704  Weight: 61 kg    Examination:  General exam: NAD.  Dry mucous membranes. Respiratory system: minimal exp wheeze.  Some coarse breath sounds.  No crackles.  Speaking in full sentences.  Normal respiratory effort.    Cardiovascular system: Regular rate  rhythm no murmurs rubs or gallops.  No JVD.  No lower extremity edema.  Gastrointestinal system: Abdomen is soft, nontender, nondistended, positive bowel sounds.  No rebound.  No guarding. Central nervous system: Alert and oriented x2.  Moving extremities spontaneously.  No focal neurological deficits.  Extremities: Symmetric 5 x 5 power. Skin: No rashes, lesions or ulcers Psychiatry: Judgement and insight appear poor to fair. Mood & affect appropriate.     Data Reviewed: I have personally reviewed following labs and imaging studies  CBC: Recent Labs  Lab 08/02/20 0830 08/03/20 0515 08/04/20 0505  WBC 9.8 6.8 7.1  NEUTROABS 7.5  --   --   HGB 12.4 11.5* 11.5*  HCT 37.5 35.4* 33.7*  MCV 97.9 97.8 95.2  PLT 185 186 224     Basic Metabolic Panel: Recent Labs  Lab 08/02/20 0830 08/03/20 0515 08/04/20 0505  NA 137 139 137  K 4.1 3.9 3.6  CL 103 106 107  CO2 26 26 23   GLUCOSE 120* 88 99  BUN 15 17 14   CREATININE 1.16* 1.00 0.81  CALCIUM 9.6 9.1 8.8*  MG  --   --  1.8  PHOS  --   --  2.3*     GFR: Estimated Creatinine Clearance: 38.1 mL/min (by C-G formula based on SCr of 0.81 mg/dL).  Liver Function Tests: Recent Labs  Lab 08/02/20 0830 08/03/20 0515 08/04/20 0505  AST 15 14*  --   ALT 9 9  --   ALKPHOS 41 41  --   BILITOT 0.9 0.8  --   PROT 8.0 7.1  --   ALBUMIN 3.6 3.2* 2.9*     CBG: Recent Labs  Lab 08/02/20 0821  GLUCAP 110*      Recent Results (from the past 240 hour(s))  Resp Panel by RT-PCR (Flu A&B, Covid) Nasopharyngeal Swab     Status: None   Collection Time: 08/02/20  8:30 AM   Specimen: Nasopharyngeal Swab; Nasopharyngeal(NP) swabs in vial transport medium  Result Value Ref Range Status   SARS Coronavirus 2 by RT PCR NEGATIVE NEGATIVE Final    Comment: (NOTE) SARS-CoV-2 target nucleic acids are NOT DETECTED.  The SARS-CoV-2 RNA is generally detectable in upper respiratory specimens during the acute phase of infection. The  lowest concentration of SARS-CoV-2 viral copies this assay can detect is 138 copies/mL. A negative result does not preclude SARS-Cov-2 infection and should not be used as the sole basis for treatment or other patient management decisions. A negative result may occur with  improper specimen collection/handling, submission of specimen other than nasopharyngeal swab, presence of viral mutation(s) within the areas targeted by this assay, and inadequate number of viral copies(<138 copies/mL). A negative result must be combined with clinical observations, patient history, and epidemiological information. The expected result is Negative.  Fact Sheet for Patients:  EntrepreneurPulse.com.au  Fact Sheet for Healthcare Providers:  IncredibleEmployment.be  This test is  no t yet approved or cleared by the [place] and  has been authorized for detection and/or diagnosis of SARS-CoV-2 by FDA under an Emergency Use Authorization (EUA). This EUA will remain  in effect (meaning this test can be used) for the duration of the COVID-19 declaration under Section 564(b)(1) of the Act, 21 U.S.C.section 360bbb-3(b)(1), unless the authorization is terminated  or revoked sooner.       Influenza A by PCR NEGATIVE NEGATIVE Final   Influenza B by PCR NEGATIVE NEGATIVE Final    Comment: (NOTE) The Xpert Xpress SARS-CoV-2/FLU/RSV plus assay is intended as an aid in the diagnosis of influenza from Nasopharyngeal swab specimens and should not be used as a sole basis for treatment. Nasal washings and aspirates are unacceptable for Xpert Xpress SARS-CoV-2/FLU/RSV testing.  Fact Sheet for Patients: EntrepreneurPulse.com.au  Fact Sheet for Healthcare Providers: IncredibleEmployment.be  This test is not yet approved or cleared by the [place] FDA and has been authorized for detection and/or diagnosis of SARS-CoV-2 by FDA under  an Emergency Use Authorization (EUA). This EUA will remain in effect (meaning this test can be used) for the duration of the COVID-19 declaration under Section 564(b)(1) of the Act, 21 U.S.C. section 360bbb-3(b)(1), unless the authorization is terminated or revoked.  Performed at [facility], [place] [address], [place], [place] [address]   Urine culture     Status: Abnormal   Collection Time: 08/02/20  9:45 AM   Specimen: Urine, Random  Result Value Ref Range Status   Specimen Description   Final    URINE, RANDOM Performed at [facility] [address] [place], [place] [address]    Special Requests   Final    NONE Performed at [facility], [place] [address] [place], [place] [address]    Culture MULTIPLE SPECIES PRESENT, SUGGEST RECOLLECTION (A)  Final   Report Status 08/03/2020 FINAL  Final          Radiology Studies: No results found.      Scheduled Meds:  apixaban  5 mg Oral BID   azithromycin  500 mg Oral Daily   citalopram  20 mg Oral Q0600   donepezil  10 mg Oral QHS   famotidine  20 mg Oral QHS   feeding supplement  237 mL Oral BID BM   ferrous sulfate  325 mg Oral Q0600   guaiFENesin  600 mg Oral BID   ipratropium-albuterol  3 mL Nebulization BID   loratadine  10 mg Oral Daily   mirtazapine  7.5 mg Oral QHS   multivitamin with minerals  1 tablet Oral Daily   pantoprazole  40 mg Oral Daily   phosphorus  250 mg Oral BID   polyethylene glycol  17 g Oral Q0600   potassium chloride SA  20 mEq Oral Q1200   Continuous Infusions:  cefTRIAXone (ROCEPHIN)  IV 2 g (08/04/20 1016)     LOS: 2 days    Time spent: 35 minutes    Irine Seal, MD [facility]   To contact the attending provider between 7A-7P or the covering provider during after hours 7P-7A, please log into the web site [email or web address] and access using universal [place] password for that web site. If you do not have the  password, please call the hospital operator.  08/04/2020, 11:51 AM

## 2020-08-04 NOTE — TOC Initial Note (Signed)
Transition of Care [facility]) - Initial/Assessment Note    Patient Details  Name: Dana Bush MRN: [id number] Date of Birth: 04/20/1926  Transition of Care [facility]) CM/SW Contact:    Joaquin Courts, RN Phone Number: 08/04/2020, 11:27 AM  Clinical Narrative:     Patient is from [facility], plan to return to facility with [facility]/OT services.  Spoke with facility and notified of anticipated discharge for 7/13.  Facility will need an updated FL2 refecting HHPT/OT and discharge medications, discharge summary, PT/OT orders to be faxed to [phone].  Facility reports patients negative COVID test from 7/10 is in date for a return to facility tomorrow.                Expected Discharge Plan: Assisted Living Barriers to Discharge: Continued Medical Work up   Patient Goals and CMS Choice Patient states their goals for this hospitalization and ongoing recovery are:: to return to [facility]      Expected Discharge Plan and Services Expected Discharge Plan: Assisted Living   Discharge Planning Services: CM Consult Post Acute Care Choice: Resumption of Svcs/PTA Provider                     DME Agency: NA                  Prior Living Arrangements/Services   Lives with:: Facility Resident Patient language and need for interpreter reviewed:: Yes Do you feel safe going back to the place where you live?: Yes      Need for Family Participation in Patient Care: Yes (Comment) Care giver support system in place?: Yes (comment)   Criminal Activity/Legal Involvement Pertinent to Current Situation/Hospitalization: No - Comment as needed  Activities of Daily Living Home Assistive Devices/Equipment: Walker (specify type) ADL Screening (condition at time of admission) Patient's cognitive ability adequate to safely complete daily activities?: No Is the patient deaf or have difficulty hearing?: Yes Does the patient have difficulty seeing, even when wearing  glasses/contacts?: No Does the patient have difficulty concentrating, remembering, or making decisions?: Yes Patient able to express need for assistance with ADLs?: No Does the patient have difficulty dressing or bathing?: Yes Independently performs ADLs?: No Communication: Independent Dressing (OT): Dependent Is this a change from baseline?: Pre-admission baseline Grooming: Dependent Is this a change from baseline?: Pre-admission baseline Feeding: Independent Bathing: Dependent Is this a change from baseline?: Pre-admission baseline Toileting: Dependent Is this a change from baseline?: Pre-admission baseline In/Out Bed: Needs assistance Is this a change from baseline?: Pre-admission baseline Walks in Home: Needs assistance Is this a change from baseline?: Pre-admission baseline Does the patient have difficulty walking or climbing stairs?: Yes Weakness of Legs: Both Weakness of Arms/Hands: Both  Permission Sought/Granted                  Emotional Assessment Appearance:: Appears stated age     Orientation: : Fluctuating Orientation (Suspected and/or reported [place])   Psych Involvement: No (comment)  Admission diagnosis:  Encephalopathy [G93.40] PNA (pneumonia) [J18.9] Pneumonia of right lung due to infectious organism, unspecified part of lung [J18.9] Patient Active Problem List   Diagnosis Date Noted   DVT (deep venous thrombosis) ([place]) 86/57/8469   Acute metabolic encephalopathy 62/95/2841   Dehydration 08/03/2020   CKD (chronic kidney disease), symptom management only, stage 3 (moderate) ([place]) 3b 08/03/2020   PNA (pneumonia) 08/02/2020   Acute pulmonary embolism ([place]) 02/28/2019   ARF (acute renal failure) ([place]) 02/28/2019  Influenza A 04/14/2017   Dementia ([place]) 04/14/2017   Hyperglycemia 04/14/2017   Malnutrition of moderate degree 04/14/2017   Sepsis due to pneumonia ([place]) 04/13/2017   Chest pain 06/17/2016   Left leg swelling 06/17/2016   Drug  overdose, accidental or unintentional, initial encounter 06/17/2016   Malnutrition of mild degree ([place]) 01/09/2011   Dizziness 01/09/2011   Compression fracture of L1 lumbar vertebra ([place]) 01/05/2011   Fall 01/05/2011   Hyponatremia 01/05/2011   Osteoporosis 01/05/2011   PCP:  Deland Pretty, MD Pharmacy:  No Pharmacies Listed    Social Determinants of Health (SDOH) Interventions    Readmission Risk Interventions No flowsheet data found.

## 2020-08-05 LAB — RENAL FUNCTION PANEL
Albumin: 3.1 g/dL — ABNORMAL LOW (ref 3.5–5.0)
Anion gap: 5 (ref 5–15)
BUN: 15 mg/dL (ref 8–23)
CO2: 25 mmol/L (ref 22–32)
Calcium: 8.9 mg/dL (ref 8.9–10.3)
Chloride: 107 mmol/L (ref 98–111)
Creatinine, Ser: 0.77 mg/dL (ref 0.44–1.00)
GFR, Estimated: >60 mL/min (ref >60)
Glucose, Bld: 121 mg/dL — ABNORMAL HIGH (ref 70–99)
Phosphorus: 3 mg/dL (ref 2.5–4.6)
Potassium: 4.1 mmol/L (ref 3.5–5.1)
Sodium: 137 mmol/L (ref 135–145)

## 2020-08-05 LAB — CBC
HCT: 34.3 % — ABNORMAL LOW (ref 36.0–46.0)
Hemoglobin: 11.1 g/dL — ABNORMAL LOW (ref 12.0–15.0)
MCH: 31.9 pg (ref 26.0–34.0)
MCHC: 32.4 g/dL (ref 30.0–36.0)
MCV: 98.6 fL (ref 80.0–100.0)
Platelets: 240 10*3/uL (ref 150–400)
RBC: 3.48 MIL/uL — ABNORMAL LOW (ref 3.87–5.11)
RDW: 12.8 % (ref 11.5–15.5)
WBC: 7.7 10*3/uL (ref 4.0–10.5)
nRBC: 0 % (ref 0.0–0.2)

## 2020-08-05 LAB — MAGNESIUM: Magnesium: 2 mg/dL (ref 1.7–2.4)

## 2020-08-05 NOTE — Care Management Important Message (Signed)
Important Message  Patient Details IM Letter placed in Patient's room Name: Dana Bush MRN: [id number] Date of Birth: January 26, 1926   Medicare Important Message Given:  Yes     Kerin Salen 08/05/2020, 9:07 AM

## 2020-08-05 NOTE — Progress Notes (Signed)
PROGRESS NOTE    Dana Bush  ZOX:[id number] DOB: Jun 02, 1926 DOA: 08/02/2020 PCP: Deland Pretty, MD   Brief Narrative:  87 year old female history of dementia, prior history of DVT/PE on chronic anticoagulation, depression presented to the ED with decreased appetite and confusion.  History was obtained from patient's daughter and per admitting MD patient is a resident at [facility], daughter stated her brother received a call from facility and mother was being transferred to the ED for further evaluation.  Patient noted to have had poor oral intake for several days with significant weakness and confusion and noted to be incontinent of urine.  There was initial concern for UTI.  Patient seen in the ED work-up concerning for pneumonia.  Assessment & Plan:   Principal Problem:   PNA (pneumonia) Active Problems:   Dementia ([place])   Acute pulmonary embolism (HCC)   DVT (deep venous thrombosis) (HCC)   Acute metabolic encephalopathy   Dehydration   CKD (chronic kidney disease), symptom management only, stage 3 (moderate) (HCC) 3b    #1 right upper lobe pneumonia on Rocephin azithromycin for 5 days.  Continue Mucinex Sudafed.  Chest x-ray shows right upper lobe pneumonia.  Urine Legionella and pneumococcus negative.  #2 acute metabolic encephalopathy secondary to #1.  Mentation is improving.  #3 history of DVT and PE on Eliquis.  #4 CKD stage IIIb stable  #5 history of dementia stable    Nutrition Problem: Inadequate oral intake Etiology: poor appetite, lethargy/confusion     Signs/Symptoms: meal completion < 50%    Interventions: Ensure Enlive (each supplement provides 350kcal and 20 grams of protein), MVI  Estimated body mass index is 24.6 kg/m as calculated from the following:   Height as of this encounter: 5\' 2"  (1.575 m).   Weight as of this encounter: 61 kg.  DVT prophylaxis: eliquis Code Status: dnr Family Communication: dw daughter  Disposition Plan:  Status  is: Inpatient  Remains inpatient appropriate because:IV treatments appropriate due to intensity of illness or inability to take PO  Dispo: The patient is from: ALF              Anticipated d/c is to: ALF              Patient currently is not medically stable to d/c.   Difficult to place patient No       Consultants: None   Procedures: None Antimicrobials: Rocephin azithromycin  Subjective:  She is resting in bed in no acute distress Objective: Vitals:   08/04/20 1924 08/04/20 2130 08/05/20 0545 08/05/20 0805  BP:  100/60 119/60   Pulse:  72 (!) 54   Resp:  18 16   Temp:  97.7 F (36.5 C) 97.9 F (36.6 C)   TempSrc:  Oral Oral   SpO2: 95% 92% 97% 94%  Weight:      Height:        Intake/Output Summary (Last 24 hours) at 08/05/2020 1347 Last data filed at 08/05/2020 1000 Gross per 24 hour  Intake 120 ml  Output 500 ml  Net -380 ml   Filed Weights   08/04/20 0704  Weight: 61 kg    Examination:  General exam: Appears calm and comfortable  Respiratory system: Clear to auscultation. Respiratory effort normal. Cardiovascular system: S1 & S2 heard, RRR. No JVD, murmurs, rubs, gallops or clicks. No pedal edema. Gastrointestinal system: Abdomen is nondistended, soft and nontender. No organomegaly or masses felt. Normal bowel sounds heard. Central nervous system: Alert and oriented. No  focal neurological deficits. Extremities: Symmetric 5 x 5 power. Skin: No rashes, lesions or ulcers Psychiatry: Judgement and insight appear normal. Mood & affect appropriate.     Data Reviewed: I have personally reviewed following labs and imaging studies  CBC: Recent Labs  Lab 08/02/20 0830 08/03/20 0515 08/04/20 0505 08/05/20 0518  WBC 9.8 6.8 7.1 7.7  NEUTROABS 7.5  --   --   --   HGB 12.4 11.5* 11.5* 11.1*  HCT 37.5 35.4* 33.7* 34.3*  MCV 97.9 97.8 95.2 98.6  PLT 185 186 224 093   Basic Metabolic Panel: Recent Labs  Lab 08/02/20 0830 08/03/20 0515 08/04/20 0505  08/05/20 0518  NA 137 139 137 137  K 4.1 3.9 3.6 4.1  CL 103 106 107 107  CO2 26 26 23 25   GLUCOSE 120* 88 99 121*  BUN 15 17 14 15   CREATININE 1.16* 1.00 0.81 0.77  CALCIUM 9.6 9.1 8.8* 8.9  MG  --   --  1.8 2.0  PHOS  --   --  2.3* 3.0   GFR: Estimated Creatinine Clearance: 38.6 mL/min (by C-G formula based on SCr of 0.77 mg/dL). Liver Function Tests: Recent Labs  Lab 08/02/20 0830 08/03/20 0515 08/04/20 0505 08/05/20 0518  AST 15 14*  --   --   ALT 9 9  --   --   ALKPHOS 41 41  --   --   BILITOT 0.9 0.8  --   --   PROT 8.0 7.1  --   --   ALBUMIN 3.6 3.2* 2.9* 3.1*   No results for input(s): LIPASE, AMYLASE in the last 168 hours. No results for input(s): AMMONIA in the last 168 hours. Coagulation Profile: No results for input(s): INR, PROTIME in the last 168 hours. Cardiac Enzymes: No results for input(s): CKTOTAL, CKMB, CKMBINDEX, TROPONINI in the last 168 hours. BNP (last 3 results) No results for input(s): PROBNP in the last 8760 hours. HbA1C: No results for input(s): HGBA1C in the last 72 hours. CBG: Recent Labs  Lab 08/02/20 0821  GLUCAP 110*   Lipid Profile: No results for input(s): CHOL, HDL, LDLCALC, TRIG, CHOLHDL, LDLDIRECT in the last 72 hours. Thyroid Function Tests: No results for input(s): TSH, T4TOTAL, FREET4, T3FREE, THYROIDAB in the last 72 hours. Anemia Panel: No results for input(s): VITAMINB12, FOLATE, FERRITIN, TIBC, IRON, RETICCTPCT in the last 72 hours. Sepsis Labs: No results for input(s): PROCALCITON, LATICACIDVEN in the last 168 hours.  Recent Results (from the past 240 hour(s))  Resp Panel by RT-PCR (Flu A&B, Covid) Nasopharyngeal Swab     Status: None   Collection Time: 08/02/20  8:30 AM   Specimen: Nasopharyngeal Swab; Nasopharyngeal(NP) swabs in vial transport medium  Result Value Ref Range Status   SARS Coronavirus 2 by RT PCR NEGATIVE NEGATIVE Final    Comment: (NOTE) SARS-CoV-2 target nucleic acids are NOT DETECTED.  The  SARS-CoV-2 RNA is generally detectable in upper respiratory specimens during the acute phase of infection. The lowest concentration of SARS-CoV-2 viral copies this assay can detect is 138 copies/mL. A negative result does not preclude SARS-Cov-2 infection and should not be used as the sole basis for treatment or other patient management decisions. A negative result may occur with  improper specimen collection/handling, submission of specimen other than nasopharyngeal swab, presence of viral mutation(s) within the areas targeted by this assay, and inadequate number of viral copies(<138 copies/mL). A negative result must be combined with clinical observations, patient history, and epidemiological information. The  expected result is Negative.  Fact Sheet for Patients:  EntrepreneurPulse.com.au  Fact Sheet for Healthcare Providers:  IncredibleEmployment.be  This test is no t yet approved or cleared by the [place] FDA and  has been authorized for detection and/or diagnosis of SARS-CoV-2 by FDA under an Emergency Use Authorization (EUA). This EUA will remain  in effect (meaning this test can be used) for the duration of the COVID-19 declaration under Section 564(b)(1) of the Act, 21 U.S.C.section 360bbb-3(b)(1), unless the authorization is terminated  or revoked sooner.       Influenza A by PCR NEGATIVE NEGATIVE Final   Influenza B by PCR NEGATIVE NEGATIVE Final    Comment: (NOTE) The Xpert Xpress SARS-CoV-2/FLU/RSV plus assay is intended as an aid in the diagnosis of influenza from Nasopharyngeal swab specimens and should not be used as a sole basis for treatment. Nasal washings and aspirates are unacceptable for Xpert Xpress SARS-CoV-2/FLU/RSV testing.  Fact Sheet for Patients: EntrepreneurPulse.com.au  Fact Sheet for Healthcare Providers: IncredibleEmployment.be  This test is not yet approved or  cleared by the [place] FDA and has been authorized for detection and/or diagnosis of SARS-CoV-2 by FDA under an Emergency Use Authorization (EUA). This EUA will remain in effect (meaning this test can be used) for the duration of the COVID-19 declaration under Section 564(b)(1) of the Act, 21 U.S.C. section 360bbb-3(b)(1), unless the authorization is terminated or revoked.  Performed at [facility], [place] [address], [place], [place] [address]   Urine culture     Status: Abnormal   Collection Time: 08/02/20  9:45 AM   Specimen: Urine, Random  Result Value Ref Range Status   Specimen Description   Final    URINE, RANDOM Performed at [facility] [address], [place], [place] [address]    Special Requests   Final    NONE Performed at [facility], [place] [address] [place], [place] [address]    Culture MULTIPLE SPECIES PRESENT, SUGGEST RECOLLECTION (A)  Final   Report Status 08/03/2020 FINAL  Final         Radiology Studies: No results found.      Scheduled Meds:  apixaban  5 mg Oral BID   azithromycin  500 mg Oral Daily   citalopram  20 mg Oral Q0600   donepezil  10 mg Oral QHS   famotidine  20 mg Oral QHS   feeding supplement  237 mL Oral BID BM   ferrous sulfate  325 mg Oral Q0600   fluticasone  2 spray Each Nare Daily   guaiFENesin  600 mg Oral BID   ipratropium-albuterol  3 mL Nebulization BID   loratadine  10 mg Oral Daily   mirtazapine  7.5 mg Oral QHS   multivitamin with minerals  1 tablet Oral Daily   pantoprazole  40 mg Oral Daily   phosphorus  250 mg Oral BID   polyethylene glycol  17 g Oral Q0600   potassium chloride SA  20 mEq Oral Q1200   pseudoephedrine  30 mg Oral BID   Continuous Infusions:  cefTRIAXone (ROCEPHIN)  IV 2 g (08/05/20 1011)     LOS: 3 days     Georgette Shell, MD 08/05/2020, 1:47 PM

## 2020-08-06 NOTE — Discharge Summary (Signed)
Physician Discharge Summary  Dana Bush KGU:[id number] DOB: [birth year] DOA: 08/02/2020  PCP: Deland Pretty, MD  Admit date: 08/02/2020 Discharge date: 08/06/2020  Admitted From: Assisted living facility Disposition: Assisted living facility Recommendations for Outpatient Follow-up:  Follow up with PCP in 1-2 weeks Please obtain BMP/CBC in one week  Home Health: Physical therapy Equipment/Devices: None  Discharge Condition: Stable CODE STATUS: DO NOT RESUSCITATE Diet recommendation: Regular diet Brief/Interim Summary:87 year old female history of dementia, prior history of DVT/PE on chronic anticoagulation, depression presented to the ED with decreased appetite and confusion.  History was obtained from patient's daughter and per admitting MD patient is a resident at [facility], daughter stated her brother received a call from facility and mother was being transferred to the ED for further evaluation.  Patient noted to have had poor oral intake for several days with significant weakness and confusion and noted to be incontinent of urine.  There was initial concern for UTI.  Patient seen in the ED work-up concerning for pneumonia.    Discharge Diagnoses:  Principal Problem:   PNA (pneumonia) Active Problems:   Dementia ([place])   Acute pulmonary embolism (HCC)   DVT (deep venous thrombosis) (HCC)   Acute metabolic encephalopathy   Dehydration   CKD (chronic kidney disease), symptom management only, stage 3 (moderate) (HCC) 3b    #1 right upper lobe pneumonia she received Rocephin azithromycin for 5 days.   Chest x-ray shows right upper lobe pneumonia.  Urine Legionella and pneumococcus negative.  Patient was on room air at the time of discharge saturating about 92%.   #2 acute metabolic encephalopathy secondary to #1.  Resolved.   #3 history of DVT and PE on Eliquis.   #4 CKD stage IIIb stable   #5 history of dementia stable    Nutrition Problem: Inadequate oral  intake Etiology: poor appetite, lethargy/confusion    Signs/Symptoms: meal completion < 50%     Interventions: Ensure Enlive (each supplement provides 350kcal and 20 grams of protein), MVI  Estimated body mass index is 24.6 kg/m as calculated from the following:   Height as of this encounter: 5\' 2"  (1.575 m).   Weight as of this encounter: 61 kg.  Discharge Instructions  Discharge Instructions     Diet - low sodium heart healthy   Complete by: As directed    Increase activity slowly   Complete by: As directed       Allergies as of 08/06/2020   No Known Allergies      Medication List     STOP taking these medications    cephALEXin 250 MG capsule Commonly known as: KEFLEX   potassium chloride SA 20 MEQ tablet Commonly known as: KLOR-CON       TAKE these medications    acetaminophen 325 MG tablet Commonly known as: TYLENOL Take 2 tablets (650 mg total) by mouth every 6 (six) hours as needed for mild pain, fever or headache (or Fever >/= 101).   albuterol 108 (90 Base) MCG/ACT inhaler Commonly known as: VENTOLIN HFA Inhale 2 puffs into the lungs every 6 (six) hours as needed for wheezing or shortness of breath.   alendronate 70 MG tablet Commonly known as: FOSAMAX Take 70 mg by mouth every Sunday. Take with a full glass of water on an empty stomach.   apixaban 5 MG Tabs tablet Commonly known as: ELIQUIS Take 1 tablet (5 mg total) by mouth 2 (two) times daily.   Calcium Carb-Cholecalciferol 600-400 MG-UNIT Tabs Take 1 tablet by  mouth 2 (two) times daily.   citalopram 20 MG tablet Commonly known as: CELEXA Take 20 mg by mouth daily at 6 (six) AM. What changed: Another medication with the same name was removed. Continue taking this medication, and follow the directions you see here.   donepezil 10 MG tablet Commonly known as: ARICEPT Take 10 mg by mouth at bedtime.   famotidine 20 MG tablet Commonly known as: PEPCID Take 20 mg by mouth at  bedtime.   ferrous sulfate 325 (65 FE) MG tablet Take 325 mg by mouth daily at 6 (six) AM.   LORazepam 0.5 MG tablet Commonly known as: ATIVAN Take 0.5 mg by mouth daily as needed for anxiety.   mirtazapine 7.5 MG tablet Commonly known as: REMERON Take 7.5 mg by mouth at bedtime.   polyethylene glycol 17 g packet Commonly known as: MIRALAX / GLYCOLAX Take 17 g by mouth daily at 6 (six) AM.   potassium chloride 10 MEQ tablet Commonly known as: KLOR-CON Take 2 tablets (20 mEq total) by mouth daily.   Spacer/Aero Chamber Mouthpiece Misc 1 Units by Does not apply route every 4 (four) hours as needed (wheezing).        Follow-up Information     Deland Pretty, MD Follow up.   Specialty: Internal Medicine Contact information: [address] [place] [address] [phone]                No Known Allergies  Consultations: None   Procedures/Studies: CT Head Wo Contrast  Result Date: 08/02/2020 CLINICAL DATA:  Altered mental status beginning yesterday. Baseline dementia. Upper respiratory symptoms 2 days. EXAM: CT HEAD WITHOUT CONTRAST TECHNIQUE: Contiguous axial images were obtained from the base of the skull through the vertex without intravenous contrast. COMPARISON:  06/22/2019 FINDINGS: Brain: Examination demonstrates ventricles and cisterns to be within normal. There is mild age related atrophy unchanged. There is chronic ischemic microvascular disease. There is no mass, mass effect, shift of midline structures or acute hemorrhage. No evidence of acute infarction. Vascular: No hyperdense vessel or unexpected calcification. Skull: Normal. Negative for fracture or focal lesion. Sinuses/Orbits: No acute finding. Other: None. IMPRESSION: 1. No acute findings. 2. Chronic ischemic microvascular disease and age related atrophy. Electronically Signed   By: Marin Olp M.D.   On: 08/02/2020 08:27   DG Chest Portable 1 View  Result Date:  08/02/2020 CLINICAL DATA:  Altered mental status starting yesterday. Cough and congestion 2 days. EXAM: PORTABLE CHEST 1 VIEW COMPARISON:  05/19/2019, 02/28/2019 FINDINGS: Patient is rotated to the right. Mild interval worsening of airspace opacification over the right upper lobe likely due to infection. Stable mild hazy density over the lateral right base. Linear atelectasis left base. No effusion. Cardiomediastinal silhouette and remainder of the exam is unchanged. IMPRESSION: Interval worsening airspace opacification over the right upper lobe likely due to infection. Stable hazy density lateral right base. Linear atelectasis left base. Electronically Signed   By: Marin Olp M.D.   On: 08/02/2020 08:23   (Echo, Carotid, EGD, Colonoscopy, ERCP)    Subjective: Patient resting in bed no events overnight had a good night on room air  Discharge Exam: Vitals:   08/05/20 1935 08/06/20 0444  BP: 119/78 (!) 167/94  Pulse: 80 80  Resp: 18 16  Temp: 98.1 F (36.7 C) 99 F (37.2 C)  SpO2: 96% 100%   Vitals:   08/05/20 0805 08/05/20 1421 08/05/20 1935 08/06/20 0444  BP:  (!) 145/75 119/78 (!) 167/94  Pulse:  78 80 80  Resp:  16 18 16   Temp:  98 F (36.7 C) 98.1 F (36.7 C) 99 F (37.2 C)  TempSrc:  Oral Oral Oral  SpO2: 94% 99% 96% 100%  Weight:      Height:        General: Pt is alert, awake, not in acute distress Cardiovascular: RRR, S1/S2 +, no rubs, no gallops Respiratory: CTA bilaterally, no wheezing, no rhonchi Abdominal: Soft, NT, ND, bowel sounds + Extremities: no edema, no cyanosis    The results of significant diagnostics from this hospitalization (including imaging, microbiology, ancillary and laboratory) are listed below for reference.     Microbiology: Recent Results (from the past 240 hour(s))  Resp Panel by RT-PCR (Flu A&B, Covid) Nasopharyngeal Swab     Status: None   Collection Time: 08/02/20  8:30 AM   Specimen: Nasopharyngeal Swab; Nasopharyngeal(NP) swabs  in vial transport medium  Result Value Ref Range Status   SARS Coronavirus 2 by RT PCR NEGATIVE NEGATIVE Final    Comment: (NOTE) SARS-CoV-2 target nucleic acids are NOT DETECTED.  The SARS-CoV-2 RNA is generally detectable in upper respiratory specimens during the acute phase of infection. The lowest concentration of SARS-CoV-2 viral copies this assay can detect is 138 copies/mL. A negative result does not preclude SARS-Cov-2 infection and should not be used as the sole basis for treatment or other patient management decisions. A negative result may occur with  improper specimen collection/handling, submission of specimen other than nasopharyngeal swab, presence of viral mutation(s) within the areas targeted by this assay, and inadequate number of viral copies(<138 copies/mL). A negative result must be combined with clinical observations, patient history, and epidemiological information. The expected result is Negative.  Fact Sheet for Patients:  EntrepreneurPulse.com.au  Fact Sheet for Healthcare Providers:  IncredibleEmployment.be  This test is no t yet approved or cleared by the [place] FDA and  has been authorized for detection and/or diagnosis of SARS-CoV-2 by FDA under an Emergency Use Authorization (EUA). This EUA will remain  in effect (meaning this test can be used) for the duration of the COVID-19 declaration under Section 564(b)(1) of the Act, 21 U.S.C.section 360bbb-3(b)(1), unless the authorization is terminated  or revoked sooner.       Influenza A by PCR NEGATIVE NEGATIVE Final   Influenza B by PCR NEGATIVE NEGATIVE Final    Comment: (NOTE) The Xpert Xpress SARS-CoV-2/FLU/RSV plus assay is intended as an aid in the diagnosis of influenza from Nasopharyngeal swab specimens and should not be used as a sole basis for treatment. Nasal washings and aspirates are unacceptable for Xpert Xpress  SARS-CoV-2/FLU/RSV testing.  Fact Sheet for Patients: EntrepreneurPulse.com.au  Fact Sheet for Healthcare Providers: IncredibleEmployment.be  This test is not yet approved or cleared by the [place] FDA and has been authorized for detection and/or diagnosis of SARS-CoV-2 by FDA under an Emergency Use Authorization (EUA). This EUA will remain in effect (meaning this test can be used) for the duration of the COVID-19 declaration under Section 564(b)(1) of the Act, 21 U.S.C. section 360bbb-3(b)(1), unless the authorization is terminated or revoked.  Performed at [facility], [place] [address] [place], [place] [address]   Urine culture     Status: Abnormal   Collection Time: 08/02/20  9:45 AM   Specimen: Urine, Random  Result Value Ref Range Status   Specimen Description   Final    URINE, RANDOM Performed at [facility] [address]  [place], [place] [address]    Special Requests   Final    NONE Performed at [facility], [place] [address], [place], [place] [address]    Culture MULTIPLE SPECIES PRESENT, SUGGEST RECOLLECTION (A)  Final   Report Status 08/03/2020 FINAL  Final     Labs: BNP (last 3 results) No results for input(s): BNP in the last 8760 hours. Basic Metabolic Panel: Recent Labs  Lab 08/02/20 0830 08/03/20 0515 08/04/20 0505 08/05/20 0518  NA 137 139 137 137  K 4.1 3.9 3.6 4.1  CL 103 106 107 107  CO2 26 26 23 25   GLUCOSE 120* 88 99 121*  BUN 15 17 14 15   CREATININE 1.16* 1.00 0.81 0.77  CALCIUM 9.6 9.1 8.8* 8.9  MG  --   --  1.8 2.0  PHOS  --   --  2.3* 3.0   Liver Function Tests: Recent Labs  Lab 08/02/20 0830 08/03/20 0515 08/04/20 0505 08/05/20 0518  AST 15 14*  --   --   ALT 9 9  --   --   ALKPHOS 41 41  --   --   BILITOT 0.9 0.8  --   --   PROT 8.0 7.1  --   --   ALBUMIN 3.6 3.2* 2.9* 3.1*   No results for input(s): LIPASE, AMYLASE in  the last 168 hours. No results for input(s): AMMONIA in the last 168 hours. CBC: Recent Labs  Lab 08/02/20 0830 08/03/20 0515 08/04/20 0505 08/05/20 0518  WBC 9.8 6.8 7.1 7.7  NEUTROABS 7.5  --   --   --   HGB 12.4 11.5* 11.5* 11.1*  HCT 37.5 35.4* 33.7* 34.3*  MCV 97.9 97.8 95.2 98.6  PLT 185 186 224 240   Cardiac Enzymes: No results for input(s): CKTOTAL, CKMB, CKMBINDEX, TROPONINI in the last 168 hours. BNP: Invalid input(s): POCBNP CBG: Recent Labs  Lab 08/02/20 0821  GLUCAP 110*   D-Dimer No results for input(s): DDIMER in the last 72 hours. Hgb A1c No results for input(s): HGBA1C in the last 72 hours. Lipid Profile No results for input(s): CHOL, HDL, LDLCALC, TRIG, CHOLHDL, LDLDIRECT in the last 72 hours. Thyroid function studies No results for input(s): TSH, T4TOTAL, T3FREE, THYROIDAB in the last 72 hours.  Invalid input(s): FREET3 Anemia work up No results for input(s): VITAMINB12, FOLATE, FERRITIN, TIBC, IRON, RETICCTPCT in the last 72 hours. Urinalysis    Component Value Date/Time   COLORURINE YELLOW 08/02/2020 0945   APPEARANCEUR HAZY (A) 08/02/2020 0945   LABSPEC 1.014 08/02/2020 0945   PHURINE 6.0 08/02/2020 0945   GLUCOSEU NEGATIVE 08/02/2020 0945   HGBUR MODERATE (A) 08/02/2020 0945   BILIRUBINUR NEGATIVE 08/02/2020 0945   KETONESUR NEGATIVE 08/02/2020 0945   PROTEINUR 30 (A) 08/02/2020 0945   UROBILINOGEN 0.2 08/10/2011 1650   NITRITE NEGATIVE 08/02/2020 0945   LEUKOCYTESUR TRACE (A) 08/02/2020 0945   Sepsis Labs Invalid input(s): PROCALCITONIN,  WBC,  LACTICIDVEN Microbiology Recent Results (from the past 240 hour(s))  Resp Panel by RT-PCR (Flu A&B, Covid) Nasopharyngeal Swab     Status: None   Collection Time: 08/02/20  8:30 AM   Specimen: Nasopharyngeal Swab; Nasopharyngeal(NP) swabs in vial transport medium  Result Value Ref Range Status   SARS Coronavirus 2 by RT PCR NEGATIVE NEGATIVE Final    Comment: (NOTE) SARS-CoV-2 target  nucleic acids are NOT DETECTED.  The SARS-CoV-2 RNA is generally detectable in upper respiratory specimens during the acute phase of infection. The lowest concentration  of SARS-CoV-2 viral copies this assay can detect is 138 copies/mL. A negative result does not preclude SARS-Cov-2 infection and should not be used as the sole basis for treatment or other patient management decisions. A negative result may occur with  improper specimen collection/handling, submission of specimen other than nasopharyngeal swab, presence of viral mutation(s) within the areas targeted by this assay, and inadequate number of viral copies(<138 copies/mL). A negative result must be combined with clinical observations, patient history, and epidemiological information. The expected result is Negative.  Fact Sheet for Patients:  EntrepreneurPulse.com.au  Fact Sheet for Healthcare Providers:  IncredibleEmployment.be  This test is no t yet approved or cleared by the [place] FDA and  has been authorized for detection and/or diagnosis of SARS-CoV-2 by FDA under an Emergency Use Authorization (EUA). This EUA will remain  in effect (meaning this test can be used) for the duration of the COVID-19 declaration under Section 564(b)(1) of the Act, 21 U.S.C.section 360bbb-3(b)(1), unless the authorization is terminated  or revoked sooner.       Influenza A by PCR NEGATIVE NEGATIVE Final   Influenza B by PCR NEGATIVE NEGATIVE Final    Comment: (NOTE) The Xpert Xpress SARS-CoV-2/FLU/RSV plus assay is intended as an aid in the diagnosis of influenza from Nasopharyngeal swab specimens and should not be used as a sole basis for treatment. Nasal washings and aspirates are unacceptable for Xpert Xpress SARS-CoV-2/FLU/RSV testing.  Fact Sheet for Patients: EntrepreneurPulse.com.au  Fact Sheet for Healthcare  Providers: IncredibleEmployment.be  This test is not yet approved or cleared by the [place] FDA and has been authorized for detection and/or diagnosis of SARS-CoV-2 by FDA under an Emergency Use Authorization (EUA). This EUA will remain in effect (meaning this test can be used) for the duration of the COVID-19 declaration under Section 564(b)(1) of the Act, 21 U.S.C. section 360bbb-3(b)(1), unless the authorization is terminated or revoked.  Performed at [facility], [place] [address], [place], [place] [address]   Urine culture     Status: Abnormal   Collection Time: 08/02/20  9:45 AM   Specimen: Urine, Random  Result Value Ref Range Status   Specimen Description   Final    URINE, RANDOM Performed at [facility] [address], [place], [place] [address]    Special Requests   Final    NONE Performed at [facility], [place] [address], [place], [place] [address]    Culture MULTIPLE SPECIES PRESENT, SUGGEST RECOLLECTION (A)  Final   Report Status 08/03/2020 FINAL  Final     Time coordinating discharge: 37 minutes  SIGNED:   Georgette Shell, MD  [facility] 08/06/2020, 9:49 AM

## 2020-08-06 NOTE — NC FL2 (Addendum)
[place] MEDICAID FL2 LEVEL OF CARE SCREENING TOOL     IDENTIFICATION  Patient Name: Dana Bush Birthdate: 05/09/1926 Sex: female Admission Date (Current Location): 08/02/2020  [facility] and [place] Number:  Herbalist and Address:  [facility],  [place], [place]      Provider Number: [id number]  Attending Physician Name and Address:  Georgette Shell, MD  Relative Name and Phone Number:       Current Level of Care: Hospital Recommended Level of Care: [place] Prior Approval Number:    Date Approved/Denied:   PASRR Number:    Discharge Plan: Other (Comment) (ALF)    Current Diagnoses: Patient Active Problem List   Diagnosis Date Noted   DVT (deep venous thrombosis) ([place]) 45/40/9811   Acute metabolic encephalopathy 91/47/8295   Dehydration 08/03/2020   CKD (chronic kidney disease), symptom management only, stage 3 (moderate) ([place]) 3b 08/03/2020   PNA (pneumonia) 08/02/2020   Acute pulmonary embolism ([place]) 02/28/2019   ARF (acute renal failure) ([place]) 02/28/2019   Influenza A 04/14/2017   Dementia ([place]) 04/14/2017   Hyperglycemia 04/14/2017   Malnutrition of moderate degree 04/14/2017   Sepsis due to pneumonia ([place]) 04/13/2017   Chest pain 06/17/2016   Left leg swelling 06/17/2016   Drug overdose, accidental or unintentional, initial encounter 06/17/2016   Malnutrition of mild degree ([place]) 01/09/2011   Dizziness 01/09/2011   Compression fracture of L1 lumbar vertebra ([place]) 01/05/2011   Fall 01/05/2011   Hyponatremia 01/05/2011   Osteoporosis 01/05/2011    Orientation RESPIRATION BLADDER Height & Weight     Place, Self  Normal Continent Weight: 61 kg Height:  5\' 2"  (157.5 cm) (per patient estimate)  BEHAVIORAL SYMPTOMS/MOOD NEUROLOGICAL BOWEL NUTRITION STATUS      Continent Regular diet.  AMBULATORY STATUS COMMUNICATION OF NEEDS Skin   Limited Assist Verbally Skin abrasions                        Personal Care Assistance Level of Assistance  Bathing, Feeding, Dressing Bathing Assistance: Limited assistance Feeding assistance: Limited assistance Dressing Assistance: Limited assistance     Functional Limitations Info  Sight, Hearing, Speech Sight Info: Adequate Hearing Info: Impaired Speech Info: Adequate    SPECIAL CARE FACTORS FREQUENCY  PT (By licensed PT)     PT Frequency: 3 x weekly              Contractures Contractures Info: Not present    Additional Factors Info  Code Status, Allergies Code Status Info: DNR Allergies Info: NKA           Current Medications (08/06/2020):  This is the current hospital active medication list Current Facility-Administered Medications  Medication Dose Route Frequency Provider Last Rate Last Admin   acetaminophen (TYLENOL) tablet 650 mg  650 mg Oral Q6H PRN Marylyn Ishihara, Tyrone A, DO       albuterol (PROVENTIL) (2.5 MG/3ML) 0.083% nebulizer solution 3 mL  3 mL Inhalation Q6H PRN Marylyn Ishihara, Tyrone A, DO       apixaban (ELIQUIS) tablet 5 mg  5 mg Oral BID Marylyn Ishihara, Tyrone A, DO   5 mg at 08/05/20 2121   azithromycin (ZITHROMAX) tablet 500 mg  500 mg Oral Daily Kyle, Tyrone A, DO   500 mg at 08/05/20 0952   cefTRIAXone (ROCEPHIN) 2 g in sodium chloride 0.9 % 100 mL IVPB  2 g Intravenous Q24H Kyle, Tyrone A, DO 200 mL/hr at 08/05/20  1011 2 g at 08/05/20 1011   citalopram (CELEXA) tablet 20 mg  20 mg Oral Q0600 Marylyn Ishihara, Tyrone A, DO   20 mg at 08/06/20 0503   donepezil (ARICEPT) tablet 10 mg  10 mg Oral QHS Kyle, Tyrone A, DO   10 mg at 08/05/20 2121   famotidine (PEPCID) tablet 20 mg  20 mg Oral QHS Kyle, Tyrone A, DO   20 mg at 08/05/20 2121   feeding supplement (ENSURE ENLIVE / ENSURE PLUS) liquid 237 mL  237 mL Oral BID BM Kyle, Tyrone A, DO   237 mL at 08/05/20 1654   ferrous sulfate tablet 325 mg  325 mg Oral Q0600 Marylyn Ishihara, Tyrone A, DO   325 mg at 08/06/20 0503   fluticasone (FLONASE) 50 MCG/ACT nasal spray 2 spray  2 spray Each Nare  Daily Eugenie Filler, MD   2 spray at 08/05/20 8938   guaiFENesin (MUCINEX) 12 hr tablet 600 mg  600 mg Oral BID Marylyn Ishihara, Tyrone A, DO   600 mg at 08/05/20 2121   loratadine (CLARITIN) tablet 10 mg  10 mg Oral Daily Eugenie Filler, MD   10 mg at 08/05/20 1017   LORazepam (ATIVAN) tablet 0.5 mg  0.5 mg Oral Daily PRN Cherylann Ratel A, DO       mirtazapine (REMERON) tablet 7.5 mg  7.5 mg Oral QHS Kyle, Tyrone A, DO   7.5 mg at 08/05/20 2121   multivitamin with minerals tablet 1 tablet  1 tablet Oral Daily Eugenie Filler, MD   1 tablet at 08/05/20 0952   ondansetron (ZOFRAN) tablet 4 mg  4 mg Oral Q6H PRN Cherylann Ratel A, DO       Or   ondansetron (ZOFRAN) injection 4 mg  4 mg Intravenous Q6H PRN Marylyn Ishihara, Tyrone A, DO       pantoprazole (PROTONIX) EC tablet 40 mg  40 mg Oral Daily Eugenie Filler, MD   40 mg at 08/05/20 5102   phosphorus (K PHOS NEUTRAL) tablet 250 mg  250 mg Oral BID Eugenie Filler, MD   250 mg at 08/05/20 2121   polyethylene glycol (MIRALAX / GLYCOLAX) packet 17 g  17 g Oral Q0600 Marylyn Ishihara, Tyrone A, DO   17 g at 08/06/20 0503   potassium chloride SA (KLOR-CON) CR tablet 20 mEq  20 mEq Oral Q1200 Marylyn Ishihara, Tyrone A, DO   20 mEq at 08/05/20 1232   pseudoephedrine (SUDAFED) tablet 30 mg  30 mg Oral BID Eugenie Filler, MD   30 mg at 08/05/20 2121     Discharge Medications:      Medication List       STOP taking these medications     cephALEXin 250 MG capsule Commonly known as: KEFLEX    potassium chloride SA 20 MEQ tablet Commonly known as: KLOR-CON           TAKE these medications     acetaminophen 325 MG tablet Commonly known as: TYLENOL Take 2 tablets (650 mg total) by mouth every 6 (six) hours as needed for mild pain, fever or headache (or Fever >/= 101).    albuterol 108 (90 Base) MCG/ACT inhaler Commonly known as: VENTOLIN HFA Inhale 2 puffs into the lungs every 6 (six) hours as needed for wheezing or shortness of breath.    alendronate 70 MG  tablet Commonly known as: FOSAMAX Take 70 mg by mouth every Sunday. Take with a full glass of water on an empty stomach.  apixaban 5 MG Tabs tablet Commonly known as: ELIQUIS Take 1 tablet (5 mg total) by mouth 2 (two) times daily.    Calcium Carb-Cholecalciferol 600-400 MG-UNIT Tabs Take 1 tablet by mouth 2 (two) times daily.    citalopram 20 MG tablet Commonly known as: CELEXA Take 20 mg by mouth daily at 6 (six) AM. What changed: Another medication with the same name was removed. Continue taking this medication, and follow the directions you see here.    donepezil 10 MG tablet Commonly known as: ARICEPT Take 10 mg by mouth at bedtime.    famotidine 20 MG tablet Commonly known as: PEPCID Take 20 mg by mouth at bedtime.    ferrous sulfate 325 (65 FE) MG tablet Take 325 mg by mouth daily at 6 (six) AM.    LORazepam 0.5 MG tablet Commonly known as: ATIVAN Take 0.5 mg by mouth daily as needed for anxiety.    mirtazapine 7.5 MG tablet Commonly known as: REMERON Take 7.5 mg by mouth at bedtime.    polyethylene glycol 17 g packet Commonly known as: MIRALAX / GLYCOLAX Take 17 g by mouth daily at 6 (six) AM.    potassium chloride 10 MEQ tablet Commonly known as: KLOR-CON Take 2 tablets (20 mEq total) by mouth daily.    Spacer/Aero Chamber Mouthpiece Misc 1 Units by Does not apply route every 4 (four) hours as needed (wheezing).       Relevant Imaging Results:  Relevant Lab Results:   Additional Information SSN: [id number]  Kristene Liberati, Marjie Skiff, RN

## 2020-08-06 NOTE — TOC Transition Note (Signed)
Transition of Care [facility]) - CM/SW Discharge Note   Patient Details  Name: Dana Bush MRN: [id number] Date of Birth: 05/25/1926  [facility]) CM/SW Contact:  Lynnell Catalan, RN Phone Number: 08/06/2020, 1:19 PM   Clinical Narrative:    Pt to dc back to [facility] today. [facility] contacted for transport. FL2, DC summary and HHPT orders faxed to [facility]. RN to call report to [phone]. Daughter contacted to inform of DC.   Final next level of care: Assisted Living Barriers to Discharge: Continued Medical Work up   Patient Goals and CMS Choice Patient states their goals for this hospitalization and ongoing recovery are:: to return to [facility]   Discharge Planning Services: CM Consult Post Acute Care Choice: Resumption of Svcs/PTA Provider            DME Agency: NA       Readmission Risk Interventions No flowsheet data found.

## 2022-04-10 IMAGING — CR DG ANKLE COMPLETE 3+V*R*
3 series · 3 of 3 positions shown · non-contrast
Comparison: None.

CLINICAL DATA: Fall on blood thinners - Eliquis. Patient had
unwitnessed fall at facility ([HOSPITAL] [HOSPITAL]) while attempting
to transfer from bed to wheelchair.

EXAM:
RIGHT ANKLE - COMPLETE 3+ VIEW

[ankle ap]
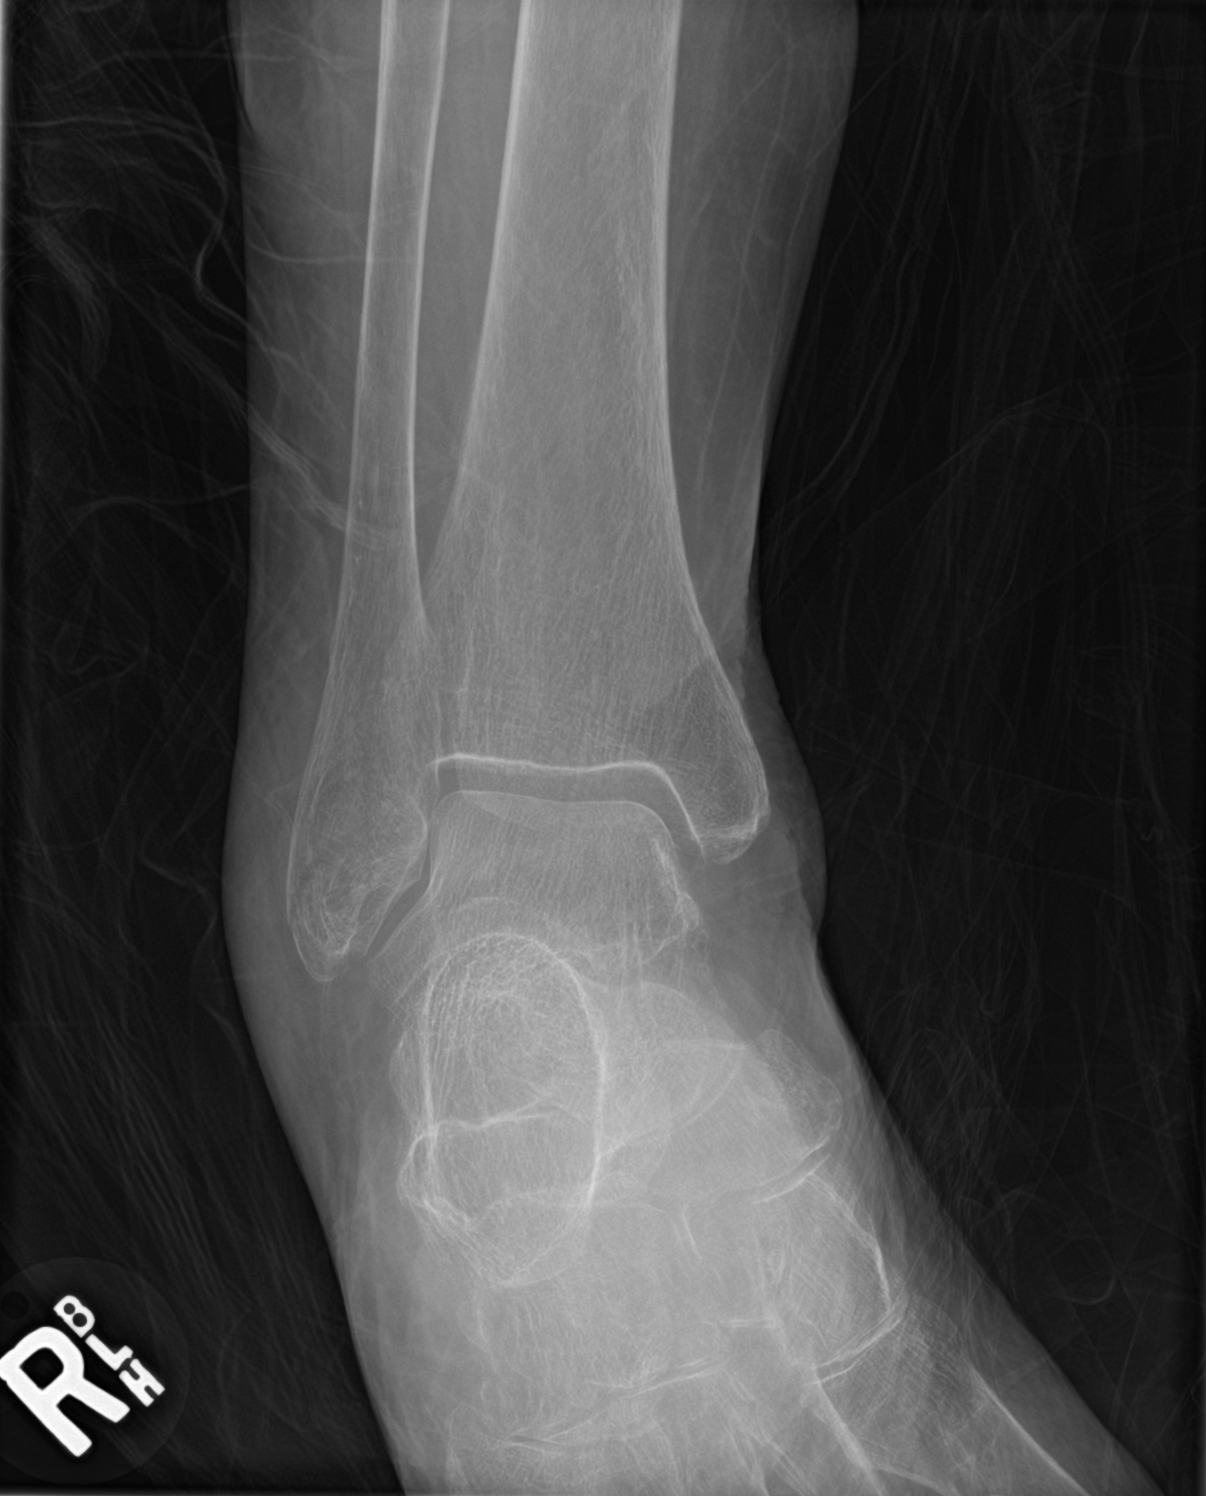

[ankle obl]
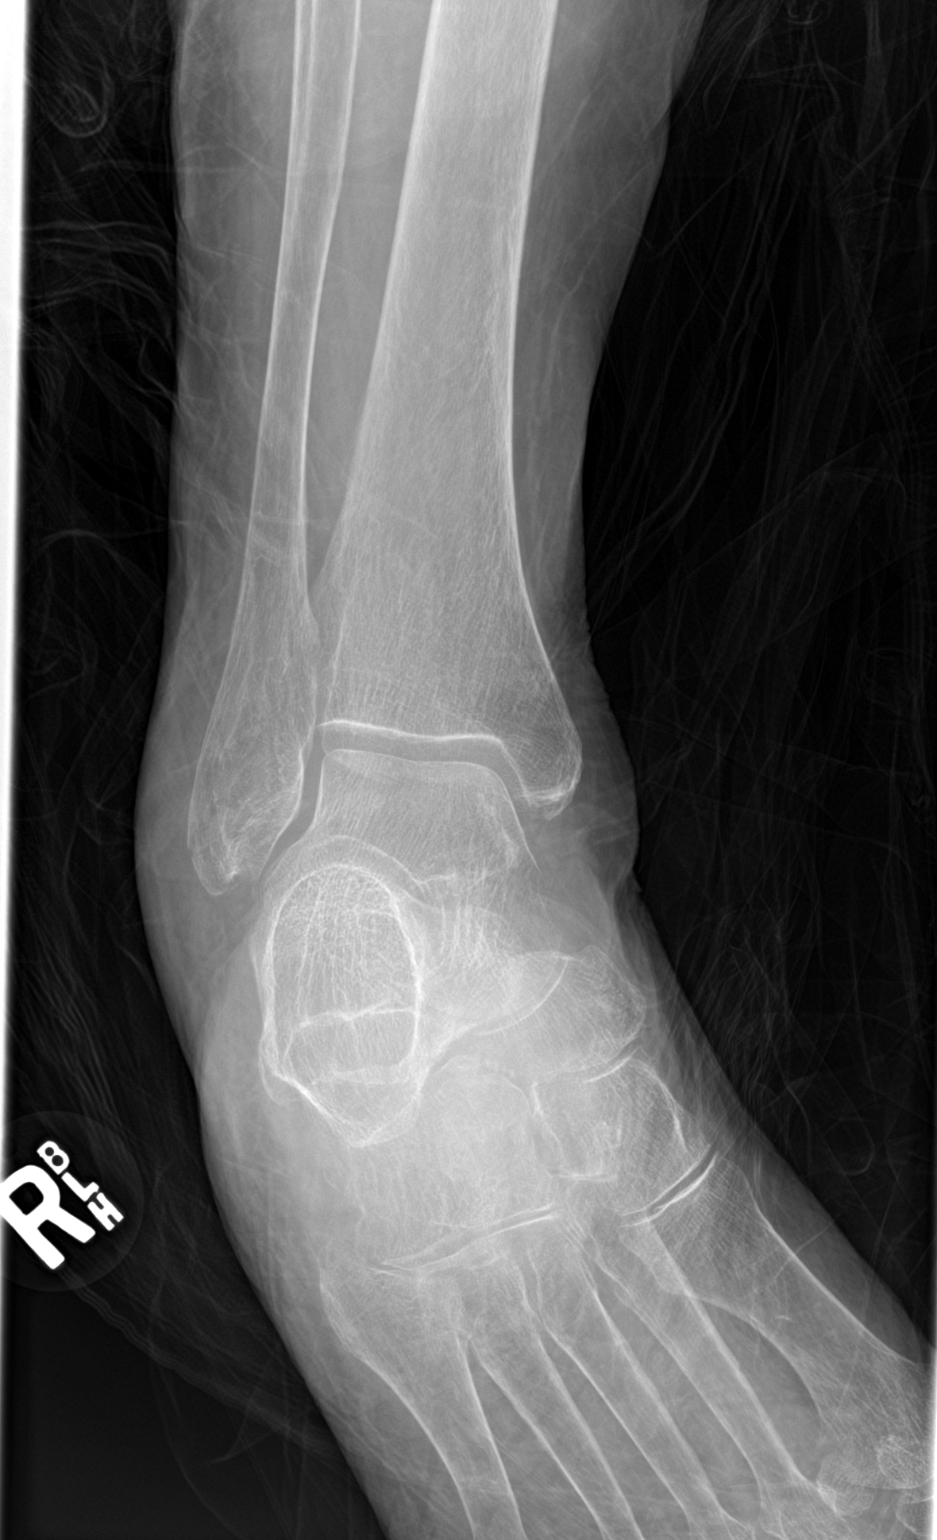

[ankle lat]
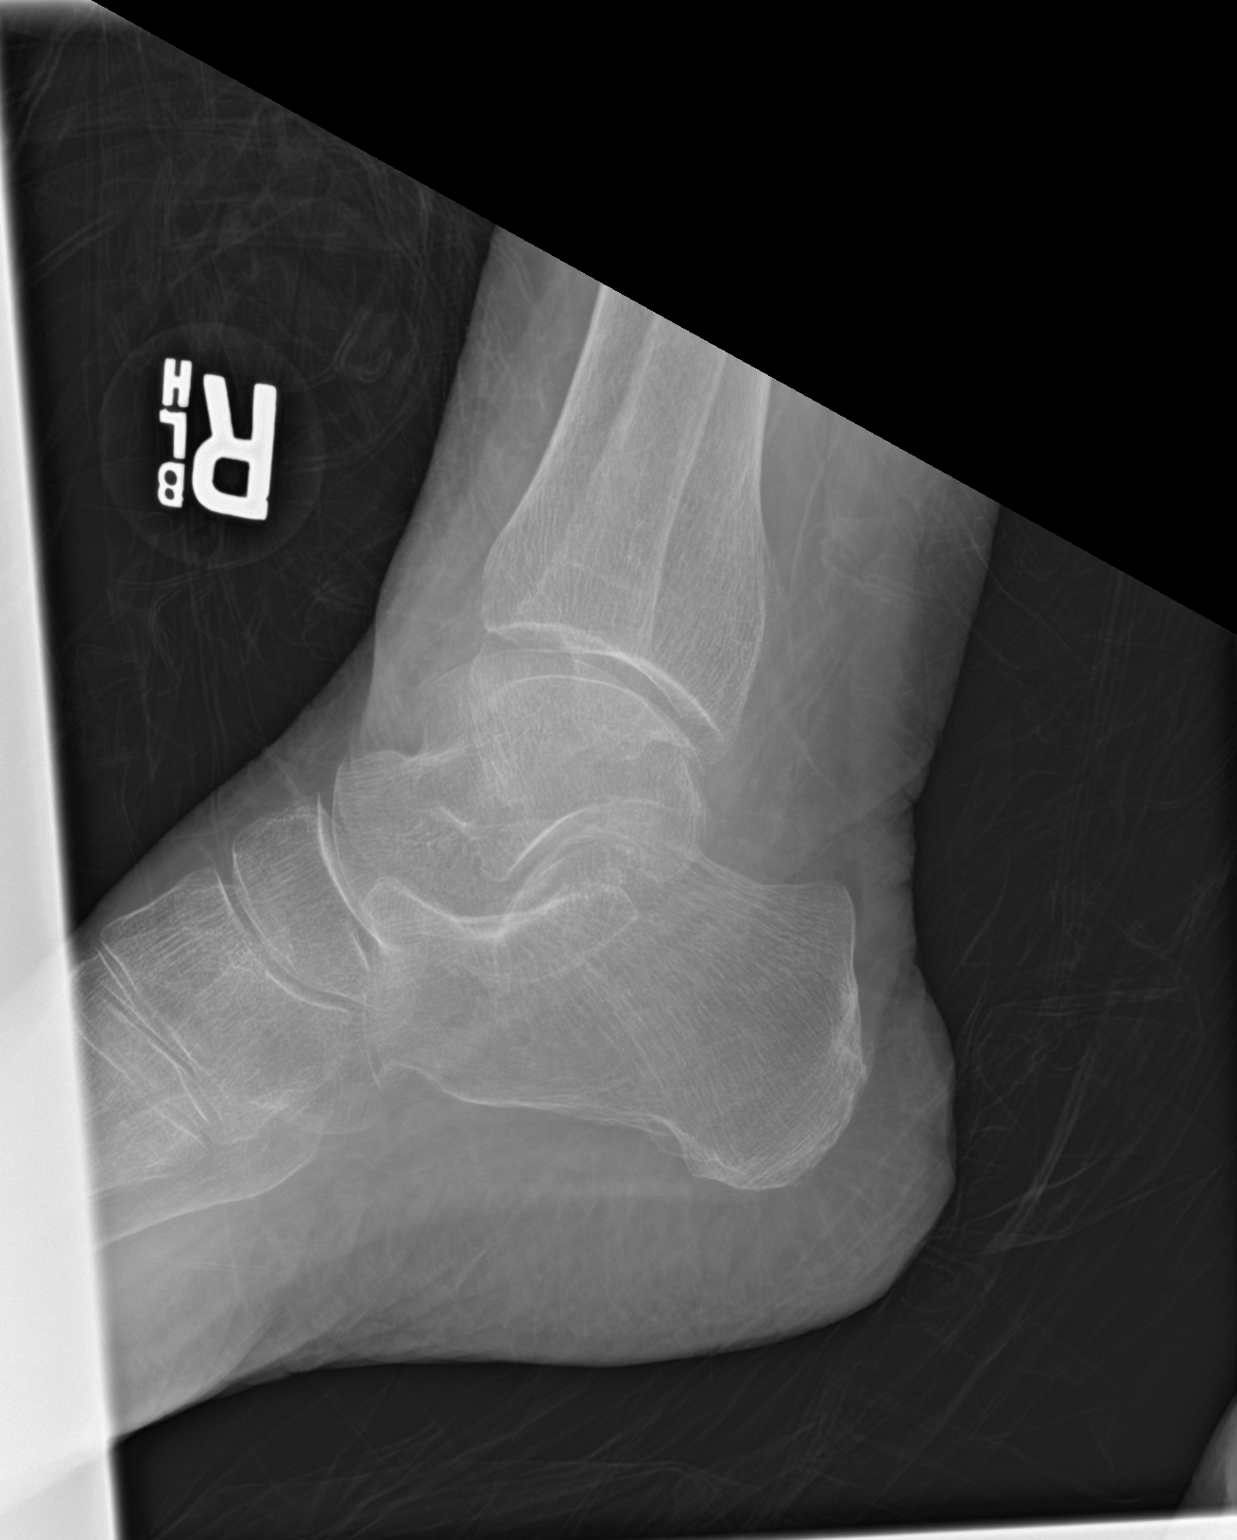

[3 of 3 positions shown; findings below may reference images not displayed]

FINDINGS: Osteopenia. There is a subtle linear lucency and cortical disruption
in the lateral malleolus, raising the possibility of nondisplaced
fracture. There is also nonspecific mild cortical irregularity at
the medial malleolus. No evidence of dislocation. No focal bone
lesion. There is regional soft tissue swelling.
IMPRESSION: 1. Subtle linear lucency and cortical disruption in the lateral
malleolus, raising the possibility of nondisplaced fracture.
2. There is also nonspecific cortical irregularity at the medial
malleolus.

Evaluation is somewhat limited by osteopenia. Consider CT of the
right ankle.

## 2022-04-10 IMAGING — CT CT ANKLE*R* W/O CM
3 series · 13 of 33 positions shown, 16 images · non-contrast
Comparison: None.

CLINICAL DATA: Pain

EXAM:
CT OF THE RIGHT ANKLE WITHOUT CONTRAST
TECHNIQUE: Multidetector CT imaging of the right ankle was performed according
to the standard protocol. Multiplanar CT image reconstructions were
also generated.

[Series 4: extremity soft tissue · axial · 0.35mm/px · z∈[-209,-69]mm · 5 of 102 slices shown, 7 images]
[im 16/102  soft-tissue]
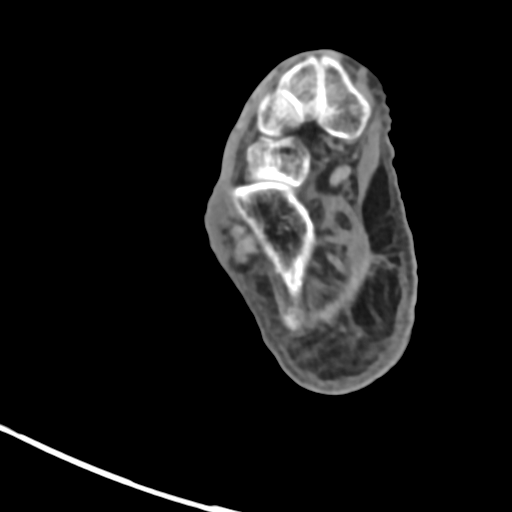
[im 16/102  bone]
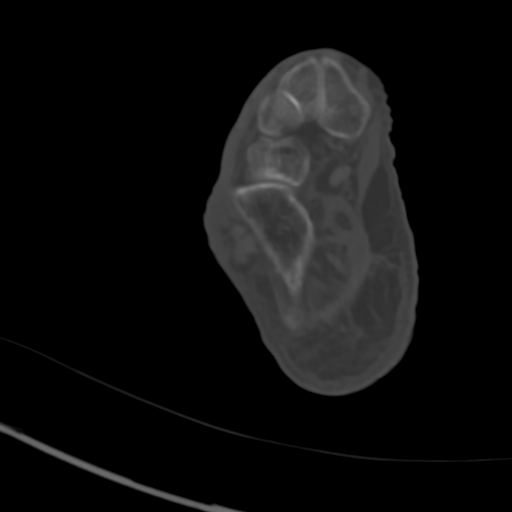
[im 32/102  bone]
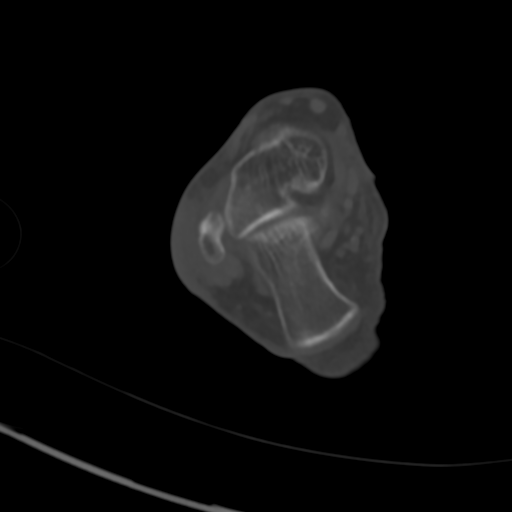
[im 55/102  bone]
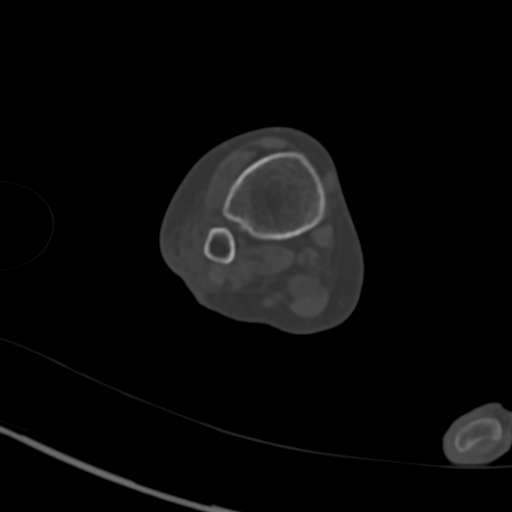
[im 70/102  bone]
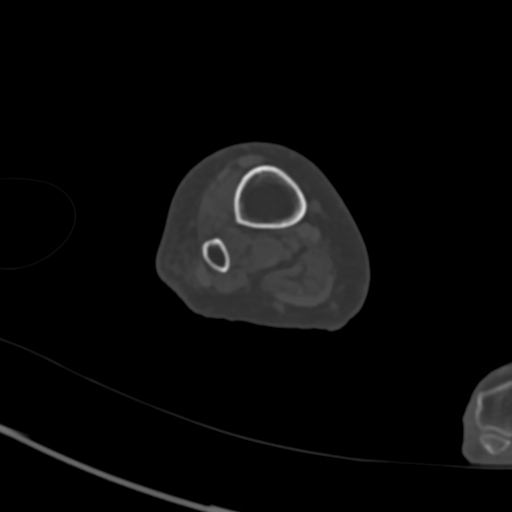
[im 86/102  soft-tissue]
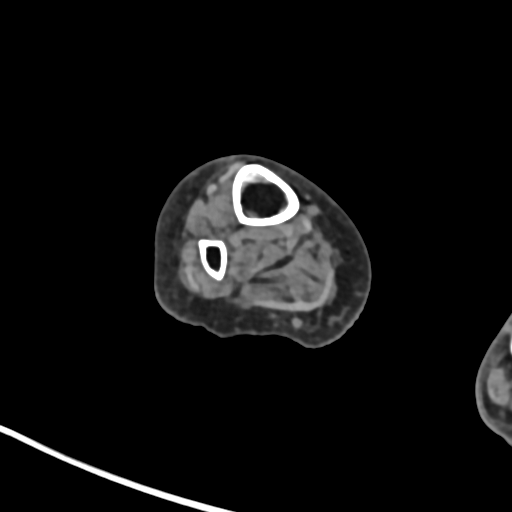
[im 86/102  bone]
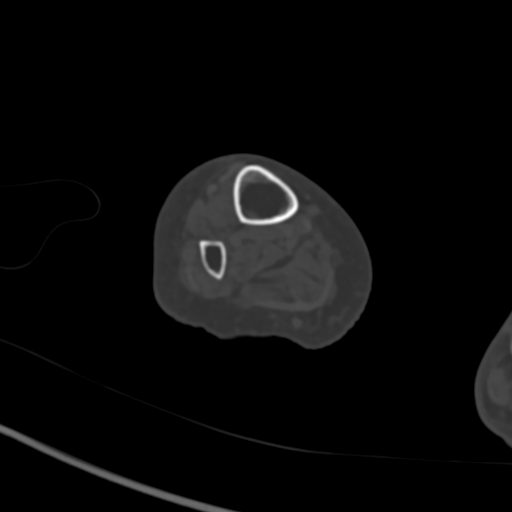

[Series 8: cor soft tissue · coronal · 0.30mm/px · 3 of 66 slices shown]
[im 14/66  bone]
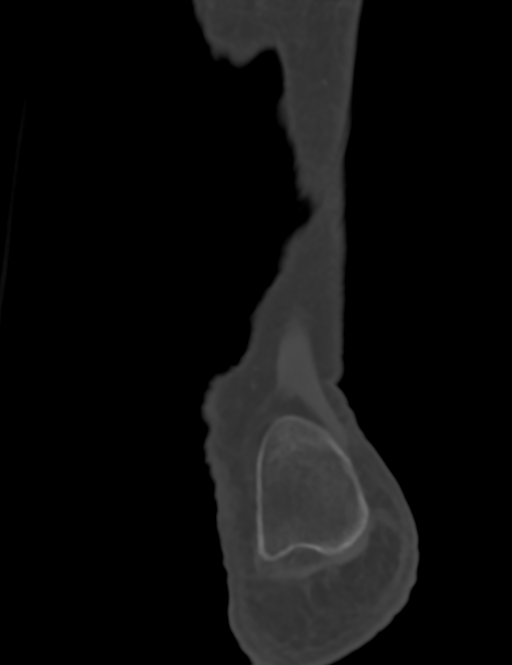
[im 27/66  bone]
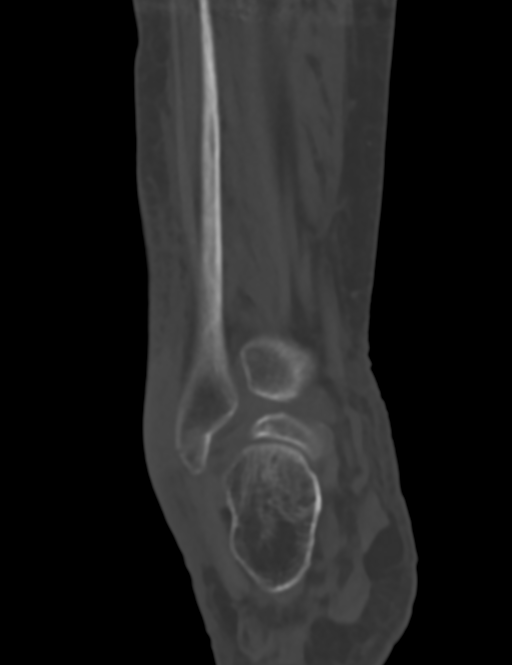
[im 40/66  bone]
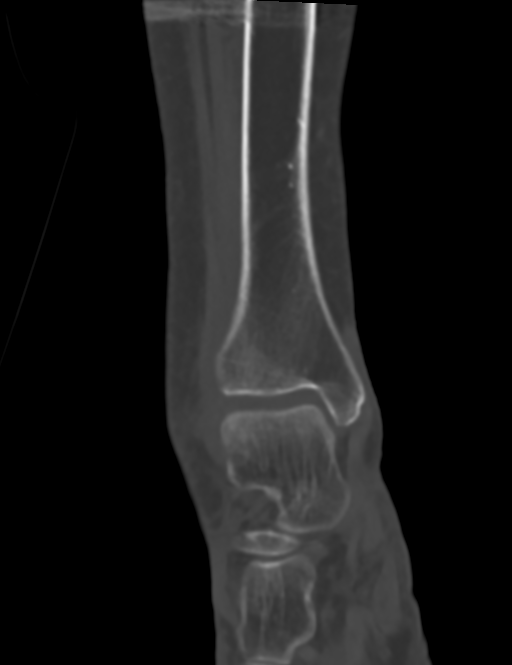

[Series 9: sag soft tissue · sagittal · 0.26mm/px · 5 of 58 slices shown, 6 images]
[im 20/58  bone]
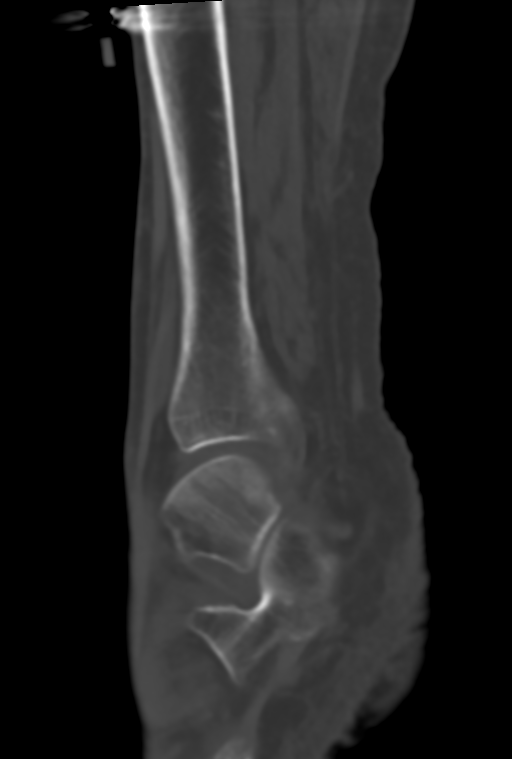
[im 24/58  bone]
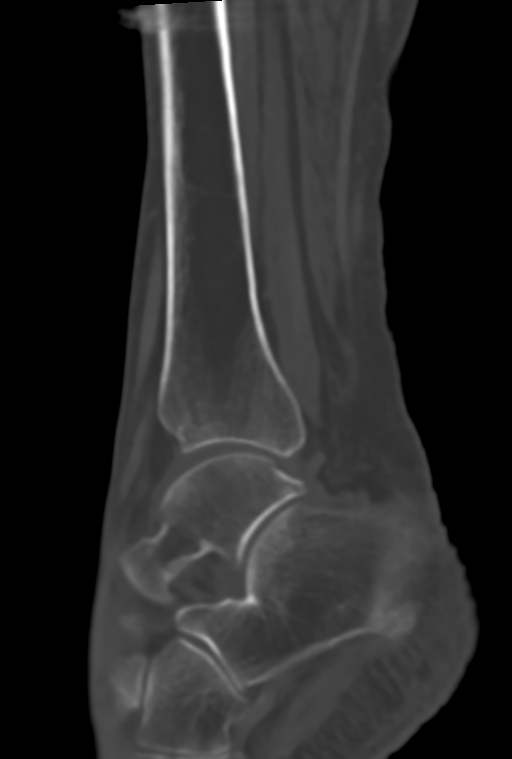
[im 29/58  soft-tissue]
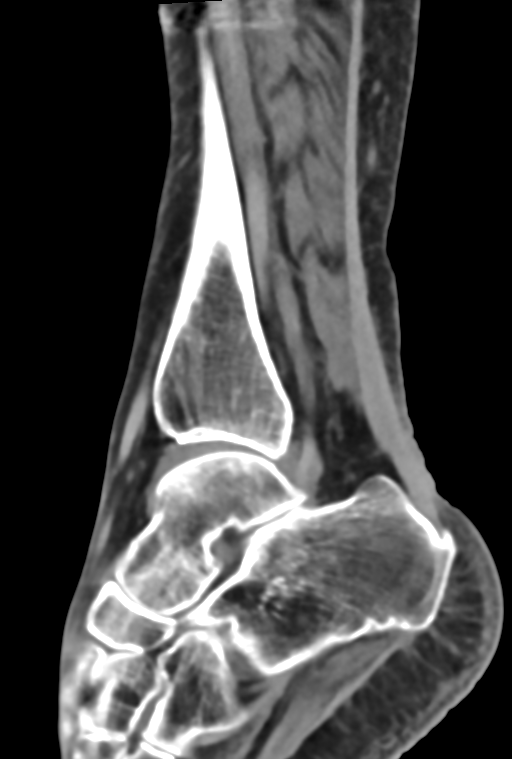
[im 29/58  bone]
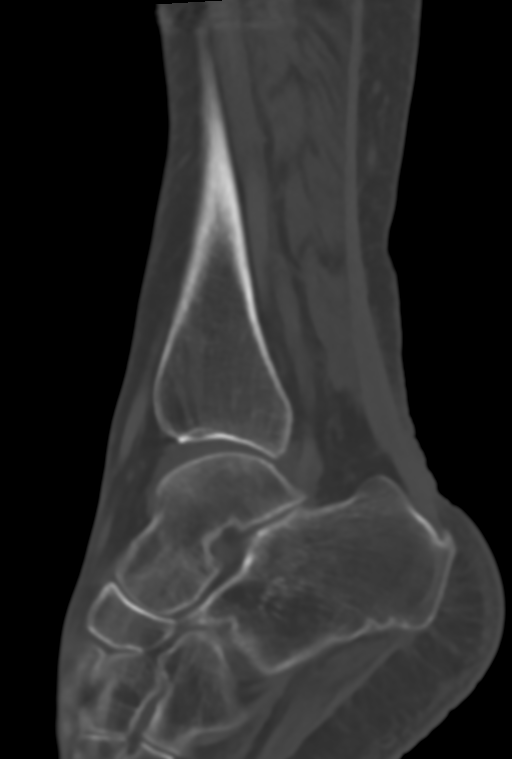
[im 34/58  bone]
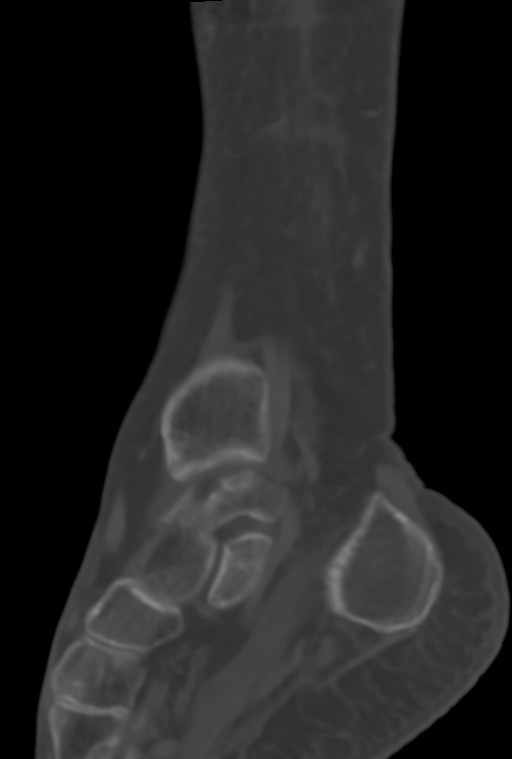
[im 39/58  bone]
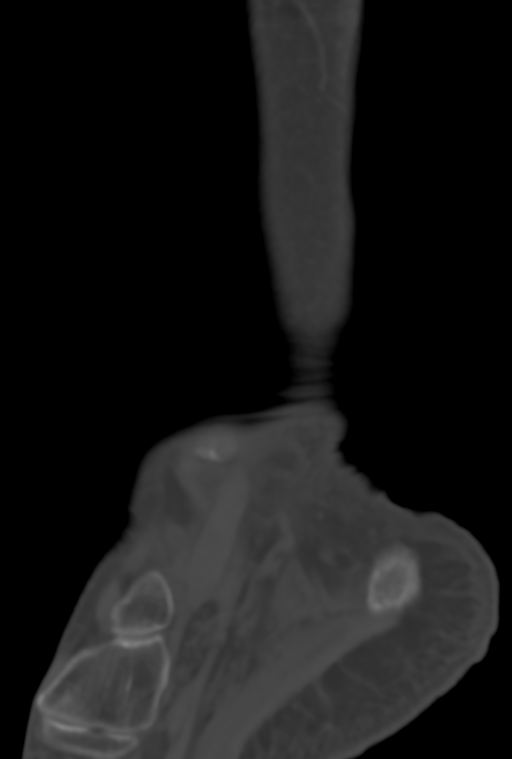

[13 of 33 positions shown; findings below may reference images not displayed]

FINDINGS: Bones/Joint/Cartilage

There is an acute nondisplaced transverse fracture through the
distal aspect of the lateral malleolus. There is no acute displaced
fracture involving the medial malleolus. No acute displaced fracture
involving the posterior malleolus. There is diffuse osteopenia.

Ligaments

Suboptimally assessed by CT.

Muscles and Tendons

No acute intramuscular hematoma identified.

Soft tissues

There is soft tissue swelling about the ankle, most notably about
the lateral malleolus.
IMPRESSION: Acute nondisplaced transverse fracture through the distal aspect of
the lateral malleolus.

## 2023-06-27 ENCOUNTER — Other Ambulatory Visit: Payer: Self-pay

## 2023-06-27 ENCOUNTER — Emergency Department (HOSPITAL_COMMUNITY)
Admission: EM | Admit: 2023-06-27 | Discharge: 2023-06-28 | Disposition: A | Discharge status: Home / Self Care | Attending: Emergency Medicine | Admitting: Emergency Medicine

## 2023-06-27 ENCOUNTER — Encounter (HOSPITAL_COMMUNITY): Payer: Self-pay

## 2023-06-27 DIAGNOSIS — F0390 Unspecified dementia without behavioral disturbance: Secondary | ICD-10-CM | POA: Insufficient documentation

## 2023-06-27 DIAGNOSIS — R531 Weakness: Secondary | ICD-10-CM | POA: Insufficient documentation

## 2023-06-27 DIAGNOSIS — R5383 Other fatigue: Secondary | ICD-10-CM | POA: Diagnosis not present

## 2023-06-27 DIAGNOSIS — Z7901 Long term (current) use of anticoagulants: Secondary | ICD-10-CM | POA: Insufficient documentation

## 2023-06-27 HISTORY — DX: Cerebral infarction, unspecified: I63.9

## 2023-06-27 LAB — COMPREHENSIVE METABOLIC PANEL WITH GFR
ALT: 9 U/L (ref 0–44)
AST: 19 U/L (ref 15–41)
Albumin: 3.7 g/dL (ref 3.5–5.0)
Alkaline Phosphatase: 54 U/L (ref 38–126)
Anion gap: 7 (ref 5–15)
BUN: 17 mg/dL (ref 8–23)
CO2: 26 mmol/L (ref 22–32)
Calcium: 9.5 mg/dL (ref 8.9–10.3)
Chloride: 102 mmol/L (ref 98–111)
Creatinine, Ser: 0.97 mg/dL (ref 0.44–1.00)
GFR, Estimated: 54 mL/min — ABNORMAL LOW (ref >60)
Glucose, Bld: 86 mg/dL (ref 70–99)
Potassium: 3.8 mmol/L (ref 3.5–5.1)
Sodium: 135 mmol/L (ref 135–145)
Total Bilirubin: 0.5 mg/dL (ref 0.0–1.2)
Total Protein: 7.4 g/dL (ref 6.5–8.1)

## 2023-06-27 LAB — CBC WITH DIFFERENTIAL/PLATELET
Abs Immature Granulocytes: 0.01 10*3/uL (ref 0.00–0.07)
Basophils Absolute: 0 10*3/uL (ref 0.0–0.1)
Basophils Relative: 1 %
Eosinophils Absolute: 0.1 10*3/uL (ref 0.0–0.5)
Eosinophils Relative: 2 %
HCT: 38.7 % (ref 36.0–46.0)
Hemoglobin: 12.5 g/dL (ref 12.0–15.0)
Immature Granulocytes: 0 %
Lymphocytes Relative: 25 %
Lymphs Abs: 1.5 10*3/uL (ref 0.7–4.0)
MCH: 31.8 pg (ref 26.0–34.0)
MCHC: 32.3 g/dL (ref 30.0–36.0)
MCV: 98.5 fL (ref 80.0–100.0)
Monocytes Absolute: 0.6 10*3/uL (ref 0.1–1.0)
Monocytes Relative: 11 %
Neutro Abs: 3.6 10*3/uL (ref 1.7–7.7)
Neutrophils Relative %: 61 %
Platelets: 232 10*3/uL (ref 150–400)
RBC: 3.93 MIL/uL (ref 3.87–5.11)
RDW: 13.5 % (ref 11.5–15.5)
WBC: 6 10*3/uL (ref 4.0–10.5)
nRBC: 0 % (ref 0.0–0.2)

## 2023-06-27 LAB — URINALYSIS, W/ REFLEX TO CULTURE (INFECTION SUSPECTED)
Bilirubin Urine: NEGATIVE
Glucose, UA: NEGATIVE mg/dL
Hgb urine dipstick: NEGATIVE
Ketones, ur: NEGATIVE mg/dL
Nitrite: NEGATIVE
Protein, ur: NEGATIVE mg/dL
Specific Gravity, Urine: 1.011 (ref 1.005–1.030)
pH: 7 (ref 5.0–8.0)

## 2023-06-27 LAB — I-STAT CG4 LACTIC ACID, ED
Lactic Acid, Venous: 1.3 mmol/L (ref 0.5–1.9)
Lactic Acid, Venous: 0.6 mmol/L (ref 0.5–1.9)

## 2023-06-27 LAB — I-STAT CHEM 8, ED
BUN: 17 mg/dL (ref 8–23)
Calcium, Ion: 1.32 mmol/L (ref 1.15–1.40)
Chloride: 104 mmol/L (ref 98–111)
Creatinine, Ser: 1.2 mg/dL — ABNORMAL HIGH (ref 0.44–1.00)
Glucose, Bld: 156 mg/dL — ABNORMAL HIGH (ref 70–99)
HCT: 38 % (ref 36.0–46.0)
Hemoglobin: 12.9 g/dL (ref 12.0–15.0)
Potassium: 4.3 mmol/L (ref 3.5–5.1)
Sodium: 138 mmol/L (ref 135–145)
TCO2: 26 mmol/L (ref 22–32)

## 2023-06-27 NOTE — Discharge Instructions (Signed)
 As discussed, your evaluation today has been largely reassuring.  But, it is important that you monitor your condition carefully, and do not hesitate to return to the ED if you develop new, or concerning changes in your condition. ? ?Otherwise, please follow-up with your physician for appropriate ongoing care. ? ?

## 2023-06-27 NOTE — ED Notes (Signed)
 This RN attempted to call [name] for report with no answer

## 2023-06-27 NOTE — ED Notes (Signed)
 This RN went to round on patient. Patient was holding IV catheter in hand stating she no longer wanted IV. This RN attempted to orient patient and educate on importance of IV. Pt stated "I don't want a needle in my arm" continuously.

## 2023-06-27 NOTE — ED Notes (Signed)
 This RN called [facility]

## 2023-06-27 NOTE — ED Notes (Signed)
 This RN attempted to call [facility] for report with no answer

## 2023-06-27 NOTE — ED Notes (Signed)
 This RN spoke to son, Siegfried Dress, regarding update on patient.

## 2023-06-27 NOTE — ED Provider Notes (Signed)
 Dana Bush EMERGENCY DEPARTMENT AT [facility] Provider Note   CSN: [id number] Arrival date & time: 06/27/23  1725     History  Chief Complaint  Patient presents with   Weakness    Dana Bush is a 89 y.o. female.  HPI Patient presents via EMS from her nursing facility after staff found her to be more tired than anticipated after she awoke from her afternoon nap.  Patient has dementia, is oriented x 1 at baseline, this is seemingly unchanged.  Per report patient awoke from a nap this afternoon was more tired than usual.  Patient self does not complain of pain, but cannot provide any details of her current condition. EMS reports no hemodynamic instability in transport.    Home Medications Prior to Admission medications   Medication Sig Start Date End Date Taking? Authorizing Provider  acetaminophen  (TYLENOL ) 325 MG tablet Take 2 tablets (650 mg total) by mouth every 6 (six) hours as needed for mild pain, fever or headache (or Fever >/= 101). 03/04/19   Perry Bramble, MD  albuterol  (PROVENTIL  HFA;VENTOLIN  HFA) 108 [phone] Base) MCG/ACT inhaler Inhale 2 puffs into the lungs every 6 (six) hours as needed for wheezing or shortness of breath. 04/17/17   Purohit, Michial Akin, MD  alendronate (FOSAMAX) 70 MG tablet Take 70 mg by mouth every Sunday. Take with a full glass of water on an empty stomach.     [provider]  apixaban  (ELIQUIS ) 5 MG TABS tablet Take 1 tablet (5 mg total) by mouth 2 (two) times daily. 03/08/19   Perry Bramble, MD  Calcium  Carb-Cholecalciferol  600-400 MG-UNIT TABS Take 1 tablet by mouth 2 (two) times daily.    [provider]  citalopram  (CELEXA ) 20 MG tablet Take 20 mg by mouth daily at 6 (six) AM.    [provider]  donepezil  (ARICEPT ) 10 MG tablet Take 10 mg by mouth at bedtime.    [provider]  famotidine  (PEPCID ) 20 MG tablet Take 20 mg by mouth at bedtime. 02/01/19   [provider]  ferrous sulfate  325 (65  FE) MG tablet Take 325 mg by mouth daily at 6 (six) AM.     [provider]  LORazepam  (ATIVAN ) 0.5 MG tablet Take 0.5 mg by mouth daily as needed for anxiety.  02/04/19   [provider]  mirtazapine  (REMERON ) 7.5 MG tablet Take 7.5 mg by mouth at bedtime. 01/28/19   [provider]  polyethylene glycol (MIRALAX  / GLYCOLAX ) packet Take 17 g by mouth daily at 6 (six) AM.     [provider]  potassium chloride  (KLOR-CON ) 10 MEQ tablet Take 2 tablets (20 mEq total) by mouth daily. 03/04/19   Perry Bramble, MD  Spacer/Aero Chamber Mouthpiece MISC 1 Units by Does not apply route every 4 (four) hours as needed (wheezing). 04/17/17   Purohit, Michial Akin, MD      Allergies    Patient has no known allergies.    Review of Systems   Review of Systems  Physical Exam Updated Vital Signs BP (!) 142/75 (BP Location: Left Arm)   Pulse (!) 55   Temp (!) 97.5 F (36.4 C) (Axillary)   Resp 18   Ht 5\' 2"  (1.575 m)   Wt 61 kg   SpO2 98%   BMI 24.60 kg/m  Physical Exam Vitals and nursing note reviewed.  Constitutional:      General: She is not in acute distress.  Appearance: She is well-developed. She is ill-appearing.  HENT:     Head: Normocephalic and atraumatic.  Eyes:     Conjunctiva/sclera: Conjunctivae normal.  Cardiovascular:     Rate and Rhythm: Normal rate and regular rhythm.  Pulmonary:     Effort: Pulmonary effort is normal. No respiratory distress.     Breath sounds: Normal breath sounds. No stridor.  Abdominal:     General: There is no distension.  Skin:    General: Skin is warm and dry.  Neurological:     Mental Status: She is alert.     Cranial Nerves: No cranial nerve deficit.     Motor: Atrophy present.     Comments: Face is symmetric speech is inconsistent but brief and clear.  Patient attempts to lift each leg to command, but is minimally able to do so.  Psychiatric:        Mood and Affect: Mood normal.     ED Results / Procedures /  Treatments   Labs (all labs ordered are listed, but only abnormal results are displayed) Labs Reviewed  COMPREHENSIVE METABOLIC PANEL WITH GFR - Abnormal; Notable for the following components:      Result Value   GFR, Estimated 54 (*)    All other components within normal limits  URINALYSIS, W/ REFLEX TO CULTURE (INFECTION SUSPECTED) - Abnormal; Notable for the following components:   Color, Urine STRAW (*)    Leukocytes,Ua MODERATE (*)    Bacteria, UA RARE (*)    All other components within normal limits  I-STAT CHEM 8, ED - Abnormal; Notable for the following components:   Creatinine, Ser 1.20 (*)    Glucose, Bld 156 (*)    All other components within normal limits  CBC WITH DIFFERENTIAL/PLATELET  I-STAT CG4 LACTIC ACID, ED  I-STAT CG4 LACTIC ACID, ED    EKG EKG Interpretation Date/Time:  Tuesday June 27 2023 18:52:40 EDT Ventricular Rate:  56 PR Interval:  135 QRS Duration:  92 QT Interval:  445 QTC Calculation: 430 R Axis:   68  Text Interpretation: Sinus rhythm Confirmed by Dorenda Gandy [phone]) on 06/27/2023 7:00:46 PM  Radiology No results found.  Procedures Procedures    Medications Ordered in ED Medications - No data to display  ED Course/ Medical Decision Making/ A&P                                 Medical Decision Making Elderly female with dementia presents for nursing facility with staff concerns of fatigue.  Here patient is hemodynamically unremarkable, has no actual complaints, no obvious causes for weakness on initial exam, concern for occult infection versus dehydration versus electrolyte abnormalities.  Labs sent ECG ordered  Amount and/or Complexity of Data Reviewed Independent Historian: EMS External Data Reviewed: notes. Labs: ordered. Decision-making details documented in ED Course. ECG/medicine tests: ordered and independent interpretation performed. Decision-making details documented in ED Course.  Risk Prescription drug  management. Decision regarding hospitalization. Diagnosis or treatment significantly limited by social determinants of health.   9:38 PM Labs unremarkable, she has been monitored for hours without decompensation, arrhythmia, no lactic acidosis, no hypotension, no obvious infection.  No complaints, patient will return to her nursing facility.        Final Clinical Impression(s) / ED Diagnoses Final diagnoses:  Weakness    Rx / DC Orders ED Discharge Orders     None  Dorenda Gandy, MD 06/27/23 2139

## 2023-06-27 NOTE — ED Triage Notes (Addendum)
 Patient BIB [facility] from [facility]. Has dementia, alert to self only. Staff said earlier today patient was "more tired" than normal and not acting her baseline after she was woken from her nap.
